# Patient Record
Sex: Male | Born: 1970 | Race: White | Hispanic: No | Smoking: Current every day smoker
Health system: Southern US, Community
[De-identification: ages and names within clinical notes are randomized; demographics above are authoritative.]

## PROBLEM LIST (undated history)

## (undated) DIAGNOSIS — J45909 Unspecified asthma, uncomplicated: Secondary | ICD-10-CM

## (undated) DIAGNOSIS — K219 Gastro-esophageal reflux disease without esophagitis: Secondary | ICD-10-CM

## (undated) DIAGNOSIS — J449 Chronic obstructive pulmonary disease, unspecified: Secondary | ICD-10-CM

## (undated) HISTORY — PX: HERNIA REPAIR: SHX51

## (undated) HISTORY — DX: Gastro-esophageal reflux disease without esophagitis: K21.9

## (undated) HISTORY — DX: Chronic obstructive pulmonary disease, unspecified: J44.9

## (undated) HISTORY — DX: Unspecified asthma, uncomplicated: J45.909

---

## 2005-10-13 ENCOUNTER — Emergency Department: Payer: Self-pay | Admitting: Emergency Medicine

## 2011-10-01 ENCOUNTER — Emergency Department: Payer: Self-pay | Admitting: Emergency Medicine

## 2017-10-05 ENCOUNTER — Ambulatory Visit: Payer: Medicaid Other | Admitting: Adult Health

## 2017-10-05 ENCOUNTER — Encounter: Payer: Self-pay | Admitting: Adult Health

## 2017-10-05 VITALS — BP 126/82 | HR 83 | Resp 16 | Ht 68.0 in | Wt 196.6 lb

## 2017-10-05 DIAGNOSIS — F332 Major depressive disorder, recurrent severe without psychotic features: Secondary | ICD-10-CM | POA: Diagnosis not present

## 2017-10-05 DIAGNOSIS — F1010 Alcohol abuse, uncomplicated: Secondary | ICD-10-CM | POA: Insufficient documentation

## 2017-10-05 DIAGNOSIS — Z23 Encounter for immunization: Secondary | ICD-10-CM

## 2017-10-05 DIAGNOSIS — F17200 Nicotine dependence, unspecified, uncomplicated: Secondary | ICD-10-CM

## 2017-10-05 DIAGNOSIS — F1910 Other psychoactive substance abuse, uncomplicated: Secondary | ICD-10-CM | POA: Insufficient documentation

## 2017-10-05 DIAGNOSIS — K219 Gastro-esophageal reflux disease without esophagitis: Secondary | ICD-10-CM

## 2017-10-05 DIAGNOSIS — R0602 Shortness of breath: Secondary | ICD-10-CM | POA: Diagnosis not present

## 2017-10-05 MED ORDER — OMEPRAZOLE 20 MG PO CPDR: 20.0000 mg | DELAYED_RELEASE_CAPSULE | Freq: Every day | ORAL | 3 refills | Status: DC

## 2017-10-05 MED ORDER — BUPROPION HCL ER (SR) 150 MG PO TB12: 150.0000 mg | ORAL_TABLET | Freq: Two times a day (BID) | ORAL | 1 refills | Status: DC

## 2017-10-05 NOTE — Patient Instructions (Signed)

## 2017-10-05 NOTE — Progress Notes (Signed)
[facility] [address] [place], [place] [address]  Internal MEDICINE  Office Visit Note  Patient Name: Clifford Burgess  [id number]  [id number]  Date of Service: 10/05/2017   Complaints/HPI Pt is here for establishment of PCP. Chief Complaint  Patient presents with  . Medical Management of Chronic Issues    NEW PT , ESTABLISHING CARE , PT HAS HISTORY OF DRUG ABUSE. CURRENT EVERYDAY DRINKER  . Asthma  . COPD    WAS ON OXYGEN CONTINOUSLY  , PATIENT MOVED FROM [place] AND LOST EVERYTHING   . Gastroesophageal Reflux   HPI Pt here to establish care.  He reports a history of GERD, COPD,  with oxygen dependence, and vertigo.  He is a poor historian, and is unsure what medications he was taking.  He moved back from [place] a year ago, and has not had his medications/oxygen since.  He reports his wife and kids are still in [place], but they kicked him out so he is living here with his sister now. Pt reports he smokes 1 pack of cigarettes daily, and drinks at least 6-8 Beers daily, some days he drinks 24. He reports his drinking has increased due to depression about his wife and kids kicking him out.     Current Medication: Outpatient Encounter Medications as of 10/05/2017  Medication Sig  . buPROPion (WELLBUTRIN SR) 150 MG 12 hr tablet Take 1 tablet (150 mg total) by mouth 2 (two) times daily.  Marland Kitchen omeprazole (PRILOSEC) 20 MG capsule Take 1 capsule (20 mg total) by mouth daily.   No facility-administered encounter medications on file as of 10/05/2017.     Surgical History: Past Surgical History:  Procedure Laterality Date  . HERNIA REPAIR      Medical History: Past Medical History:  Diagnosis Date  . Asthma   . COPD (chronic obstructive pulmonary disease) (HCC)   . GERD (gastroesophageal reflux disease)     Family History: Family History  Family history unknown: Yes    Social History   Socioeconomic History  . Marital status: Married    Spouse name: Not on file  . Number  of children: Not on file  . Years of education: Not on file  . Highest education level: Not on file  Occupational History  . Not on file  Social Needs  . Financial resource strain: Not on file  . Food insecurity:    Worry: Not on file    Inability: Not on file  . Transportation needs:    Medical: Not on file    Non-medical: Not on file  Tobacco Use  . Smoking status: Current Every Day Smoker  . Smokeless tobacco: Current User    Types: Chew  Substance and Sexual Activity  . Alcohol use: Yes  . Drug use: Not Currently    Types: Cocaine, Marijuana, Heroin, LSD, "Crack" cocaine  . Sexual activity: Yes  Lifestyle  . Physical activity:    Days per week: Not on file    Minutes per session: Not on file  . Stress: Not on file  Relationships  . Social connections:    Talks on phone: Not on file    Gets together: Not on file    Attends religious service: Not on file    Active member of club or organization: Not on file    Attends meetings of clubs or organizations: Not on file    Relationship status: Not on file  . Intimate partner violence:    Fear of current  or ex partner: Not on file    Emotionally abused: Not on file    Physically abused: Not on file    Forced sexual activity: Not on file  Other Topics Concern  . Not on file  Social History Narrative  . Not on file   Review of Systems  Constitutional: Negative.  Negative for chills, fatigue and unexpected weight change.  HENT: Negative.  Negative for congestion, rhinorrhea, sneezing and sore throat.   Eyes: Negative for redness.  Respiratory: Positive for shortness of breath. Negative for cough and chest tightness.        SOB with minimal exertion  Cardiovascular: Negative.  Negative for chest pain and palpitations.  Gastrointestinal: Negative.  Negative for abdominal pain, constipation, diarrhea, nausea and vomiting.  Endocrine: Negative.   Genitourinary: Negative.  Negative for dysuria and frequency.   Musculoskeletal: Negative.  Negative for arthralgias, back pain, joint swelling and neck pain.  Skin: Negative.  Negative for rash.  Allergic/Immunologic: Negative.   Neurological: Negative.  Negative for tremors and numbness.  Hematological: Negative for adenopathy. Does not bruise/bleed easily.  Psychiatric/Behavioral: Negative.  Negative for behavioral problems, sleep disturbance and suicidal ideas. The patient is not nervous/anxious.     Vital Signs: BP 126/82   Pulse 83   Resp 16   Ht 5\' 8"  (1.727 m)   Wt 196 lb 9.6 oz (89.2 kg)   SpO2 91%   BMI 29.89 kg/m    Physical Exam  Constitutional: He is oriented to person, place, and time. He appears well-developed and well-nourished. No distress.  HENT:  Head: Normocephalic and atraumatic.  Mouth/Throat: Oropharynx is clear and moist. No oropharyngeal exudate.  Eyes: Pupils are equal, round, and reactive to light. EOM are normal.  Neck: Normal range of motion. Neck supple. No JVD present. No tracheal deviation present. No thyromegaly present.  Cardiovascular: Normal rate, regular rhythm and normal heart sounds. Exam reveals no gallop and no friction rub.  No murmur heard. Pulmonary/Chest: Effort normal and breath sounds normal. No respiratory distress. He has no wheezes. He has no rales. He exhibits no tenderness.  Abdominal: Soft. There is no tenderness. There is no guarding.  Musculoskeletal: Normal range of motion.  Lymphadenopathy:    He has no cervical adenopathy.  Neurological: He is alert and oriented to person, place, and time. No cranial nerve deficit.  Skin: Skin is warm and dry. He is not diaphoretic.  Psychiatric: He has a normal mood and affect. His behavior is normal. Judgment and thought content normal.  Nursing note and vitals reviewed.  Assessment/Plan: 1. Gastroesophageal reflux disease without esophagitis Start Prilosec, as before.  Will follow up with results.  Encouraged dietary restrictions.   2. Severe  episode of recurrent major depressive disorder, without psychotic features (HCC) - buPROPion (WELLBUTRIN SR) 150 MG 12 hr tablet; Take 1 tablet (150 mg total) by mouth 2 (two) times daily.  Dispense: 60 tablet; Refill: 1  3. Shortness of breath - Ambulatory referral to Pulmonology - Pulse oximetry, overnight; Future 6 minute walk was inconclusive.  4. Alcohol abuse Encouraged patient to cut down on drinking, and when he is ready to quit, we will assist him.  5. 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  6. Flu vaccine need - Flu Vaccine MDCK QUAD PF  7. Tobacco Dependence Smoking cessation counseling: 1. Pt acknowledges the risks of long term smoking, she will try to quite smoking. 2. Options for different medications  including nicotine products, chewing gum, patch etc, Wellbutrin and Chantix is discussed 3. Goal and date of compete cessation is discussed 4. Total time spent in smoking cessation is 15 min.  General Counseling: notnamed scholz understanding of the findings of todays visit and agrees with plan of treatment. I have discussed any further diagnostic evaluation that may be needed or ordered today. We also reviewed his medications today. he has been encouraged to call the office with any questions or concerns that should arise related to todays visit.  Orders Placed This Encounter  Procedures  . Flu Vaccine MDCK QUAD PF  . Ambulatory referral to Pulmonology  . Pulse oximetry, overnight  . 6 minute walk    Meds ordered this encounter  Medications  . omeprazole (PRILOSEC) 20 MG capsule    Sig: Take 1 capsule (20 mg total) by mouth daily.    Dispense:  30 capsule    Refill:  3  . buPROPion (WELLBUTRIN SR) 150 MG 12 hr tablet    Sig: Take 1 tablet (150 mg total) by mouth 2 (two) times daily.    Dispense:  60 tablet    Refill:  1    Time spent: 25 Minutes   This patient was seen by Blima Ledger AGNP-C in Collaboration  with Dr Lyndon Code as a part of collaborative care agreement

## 2017-10-08 ENCOUNTER — Telehealth: Payer: Self-pay

## 2017-10-08 NOTE — Telephone Encounter (Signed)
Gave [facility] overnight oximetry test order/Beth °

## 2017-10-12 ENCOUNTER — Encounter: Payer: Self-pay | Admitting: Internal Medicine

## 2017-10-12 NOTE — Progress Notes (Signed)
SCANNED IN DME ORDER FOR OVERNIGHT OX ORDERED ON 10/05/17.

## 2017-10-15 ENCOUNTER — Ambulatory Visit: Payer: Medicaid Other | Admitting: Internal Medicine

## 2017-10-15 ENCOUNTER — Encounter: Payer: Self-pay | Admitting: Internal Medicine

## 2017-10-15 ENCOUNTER — Other Ambulatory Visit: Payer: Self-pay | Admitting: Adult Health

## 2017-10-15 VITALS — BP 110/60 | HR 80 | Resp 16 | Ht 69.0 in | Wt 193.0 lb

## 2017-10-15 DIAGNOSIS — R0602 Shortness of breath: Secondary | ICD-10-CM

## 2017-10-15 DIAGNOSIS — F1010 Alcohol abuse, uncomplicated: Secondary | ICD-10-CM | POA: Diagnosis not present

## 2017-10-15 DIAGNOSIS — F17200 Nicotine dependence, unspecified, uncomplicated: Secondary | ICD-10-CM | POA: Insufficient documentation

## 2017-10-15 DIAGNOSIS — Z9981 Dependence on supplemental oxygen: Secondary | ICD-10-CM | POA: Insufficient documentation

## 2017-10-15 NOTE — Patient Instructions (Signed)
Hypoxia Hypoxia is a condition that happens when there is a lack of oxygen in the body's tissues and organs. When there is not enough oxygen, organs cannot work as they should. This causes serious problems throughout the body and in the brain. What are the causes? This condition may be caused by:  Exposure to high altitude.  A collapsed lung (pneumothorax).  Lung infection (pneumonia).  Lung injury.  Long-term (chronic) lung disease, such as COPD (chronic obstructive pulmonary disease).  Blood collecting in the chest cavity (hemothorax).  Food, saliva, or vomit getting into the airway (aspiration).  Reduced blood flow (ischemia).  Severe blood loss.  Slow or shallow breathing (hypoventilation).  Blood disorders, such as anemia.  Carbon monoxide poisoning.  The heart suddenly stopping (cardiac arrest).  Anesthetic medicines.  Drowning.  Choking.  What are the signs or symptoms? Symptoms of this condition include:  Headache.  Fatigue.  Drowsiness.  Forgetfulness.  Nausea.  Confusion.  Shortness of breath.  Dizziness.  Bluish color of the skin, lips, or nail beds (cyanosis).  Change in consciousness or awareness.  If hypoxia is not treated, it can lead to convulsions, loss of consciousness (coma), or brain damage. How is this diagnosed? This condition may be diagnosed based on:  A physical exam.  Blood tests.  A test that measures how much oxygen is in your blood (pulse oximetry). This is done with a sensor that is placed on your finger, toe, or earlobe.  Chest X-ray.  Tests to check your lung function (pulmonary function tests).  A test to check the electrical activity of your heart (electrocardiogram, ECG).  You may have other tests to determine the cause of your hypoxia. How is this treated? Treatment for this condition depends on what is causing the hypoxia. You will likely be treated with oxygen therapy. This may be done by giving you  oxygen through a face mask or through tubes in your nose. Your health care provider may also recommend other therapies to treat the underlying cause of your hypoxia. Follow these instructions at home:  Take over-the-counter and prescription medicines only as told by your health care provider.  Do not use any products that contain nicotine or tobacco, such as cigarettes and e-cigarettes. If you need help quitting, ask your health care provider.  Avoid secondhand smoke.  Work with your health care provider to manage any chronic conditions you have that may be causing hypoxia, such as COPD.  Keep all follow-up visits as told by your health care provider. This is important. Contact a health care provider if:  You have a fever.  You have trouble breathing, even after treatment.  You become extremely short of breath when you exercise. Get help right away if:  Your shortness of breath gets worse, especially with normal or very little activity.  Your skin, lips, or nail beds have a bluish color.  You become confused or you cannot think properly.  You have chest pain. Summary  Hypoxia is a condition that happens when there is a lack of oxygen in the body's tissues and organs.  If hypoxia is not treated, it can lead to convulsions, loss of consciousness (coma), or brain damage.  Symptoms of hypoxia can include a headache, shortness of breath, confusion, nausea, and a bluish skin color.  Hypoxia has many possible causes, including exposure to high altitude, carbon monoxide poisoning, or other health issues, such as blood disorders or cardiac arrest.  Hypoxia is usually treated with oxygen therapy. This information   is not intended to replace advice given to you by your health care provider. Make sure you discuss any questions you have with your health care provider. Document Released: 02/14/2016 Document Revised: 02/14/2016 Document Reviewed: 02/14/2016 Elsevier Interactive Patient  Education  2018 Elsevier Inc.  

## 2017-10-15 NOTE — Progress Notes (Signed)
[facility] [address] [place], [place] [address]  Pulmonary Sleep Medicine   Office Visit Note  Patient Name: Clifford Burgess DOB: 06-Jun-1970 MRN [id number]  Date of Service: 10/15/2017  Complaints/HPI: Patient is here for follow-up he is here to establish with pulmonary patient.  He states that he was on oxygen before he moved here in the state of [place].  Patient states that since he is been here basically has not been on any oxygen.  States he is a smoker and he continues to smoke.  He has really no desire to stop smoking.  He has a history of alcohol abuse still heavy the patient also has a history of drug abuse but he says he is clean and he has not been doing anything in the form of crack or cocaine.  States he has no chest pain no palpitations.  Denies having any fevers or chills.  He does cough brings up phlegm no hemoptysis is noted.  Patient denies having any weight loss.  He does not recall when he had PFTs but has been told that he does have a diagnosis of COPD by his previous providers.  ROS  General: (-) fever, (-) chills, (-) night sweats, (-) weakness Skin: (-) rashes, (-) itching,. Eyes: (-) visual changes, (-) redness, (-) itching. Nose and Sinuses: (-) nasal stuffiness or itchiness, (-) postnasal drip, (-) nosebleeds, (-) sinus trouble. Mouth and Throat: (-) sore throat, (-) hoarseness. Neck: (-) swollen glands, (-) enlarged thyroid, (-) neck pain. Respiratory: + cough, (-) bloody sputum, + shortness of breath, + wheezing. Cardiovascular: - ankle swelling, (-) chest pain. Lymphatic: (-) lymph node enlargement. Neurologic: (-) numbness, (-) tingling. Psychiatric: (-) anxiety, (-) depression   Current Medication: Outpatient Encounter Medications as of 10/15/2017  Medication Sig  . buPROPion (WELLBUTRIN SR) 150 MG 12 hr tablet Take 1 tablet (150 mg total) by mouth 2 (two) times daily.  Marland Kitchen omeprazole (PRILOSEC) 20 MG capsule Take 1 capsule (20 mg total) by  mouth daily.   No facility-administered encounter medications on file as of 10/15/2017.     Surgical History: Past Surgical History:  Procedure Laterality Date  . HERNIA REPAIR      Medical History: Past Medical History:  Diagnosis Date  . Asthma   . COPD (chronic obstructive pulmonary disease) (HCC)   . GERD (gastroesophageal reflux disease)     Family History: Family History  Family history unknown: Yes    Social History: Social History   Socioeconomic History  . Marital status: Married    Spouse name: Not on file  . Number of children: Not on file  . Years of education: Not on file  . Highest education level: Not on file  Occupational History  . Not on file  Social Needs  . Financial resource strain: Not on file  . Food insecurity:    Worry: Not on file    Inability: Not on file  . Transportation needs:    Medical: Not on file    Non-medical: Not on file  Tobacco Use  . Smoking status: Current Every Day Smoker  . Smokeless tobacco: Current User    Types: Chew  Substance and Sexual Activity  . Alcohol use: Yes  . Drug use: Not Currently    Types: Cocaine, Marijuana, Heroin, LSD, "Crack" cocaine  . Sexual activity: Yes  Lifestyle  . Physical activity:    Days per week: Not on file    Minutes per session: Not on file  .  Stress: Not on file  Relationships  . Social connections:    Talks on phone: Not on file    Gets together: Not on file    Attends religious service: Not on file    Active member of club or organization: Not on file    Attends meetings of clubs or organizations: Not on file    Relationship status: Not on file  . Intimate partner violence:    Fear of current or ex partner: Not on file    Emotionally abused: Not on file    Physically abused: Not on file    Forced sexual activity: Not on file  Other Topics Concern  . Not on file  Social History Narrative  . Not on file    Vital Signs: Blood pressure 110/60, pulse 80, resp. rate  16, height 5\' 9"  (1.753 m), weight 193 lb (87.5 kg), SpO2 93 %.  Examination: General Appearance: The patient is well-developed, well-nourished, and in no distress. Skin: Gross inspection of skin unremarkable. Head: normocephalic, no gross deformities. Eyes: no gross deformities noted. ENT: ears appear grossly normal no exudates. Neck: Supple. No thyromegaly. No LAD. Respiratory: few rhonchi are noted. Cardiovascular: Normal S1 and S2 without murmur or rub. Extremities: No cyanosis. pulses are equal. Neurologic: Alert and oriented. No involuntary movements.  LABS: No results found for this or any previous visit (from the past 2160 hour(s)).  Radiology No results found.    Assessment and Plan: Patient Active Problem List   Diagnosis Date Noted  . 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 Smoker 10/15/2017  . SOB (shortness of breath) 10/15/2017  . Alcohol abuse 10/05/2017  . Drug abuse (HCC) 10/05/2017    1. SOB still has significant symptoms of shortness of breath likely related to ongoing tobacco use.  He also has a history of oxygen dependence we need to see if there is still a need for oxygen.  We will get him requalified.  Smoking cessation counseling was of course provided once again.  He also needs to be more active and exercise as tolerated.  We will also get pulmonary functions 2. Smoker continues to smoke heavy he has no desire to stop smoking.  Smoking cessation counseling was provided as part of initial evaluation. 3. ETOH continues to drink once again counseling provided for drinking patient does not really have a desire to stop 4. Drug Abuse by history not currently abusing 5. Oxygen dependence will requalify him for oxygen he will get a 6-minute walk and possibly a nocturnal oximetry  General Counseling: I have discussed the findings of the evaluation and examination with Clifford Burgess.  I have also discussed any further diagnostic evaluation thatmay be needed  or ordered today. Clifford Burgess verbalizes understanding of the findings of todays visit. We also reviewed his medications today and discussed drug interactions and side effects including but not limited excessive drowsiness and altered mental states. We also discussed that there is always a risk not just to him but also people around him. he has been encouraged to call the office with any questions or concerns that should arise related to todays visit.    Time spent:  I have personally obtained a history, examined the patient, evaluated laboratory and imaging results, formulated the assessment and plan and placed orders.    Yevonne Pax, MD [facility] Pulmonary and Critical Care Sleep medicine

## 2017-10-16 LAB — CBC WITH DIFFERENTIAL/PLATELET
Basophils Absolute: 0 10*3/uL (ref 0.0–0.2)
Basos: 0 %
EOS (ABSOLUTE): 0.1 10*3/uL (ref 0.0–0.4)
Eos: 2 %
Hematocrit: 41 % (ref 37.5–51.0)
Hemoglobin: 14 g/dL (ref 13.0–17.7)
IMMATURE GRANS (ABS): 0 10*3/uL (ref 0.0–0.1)
IMMATURE GRANULOCYTES: 0 %
LYMPHS: 32 %
Lymphocytes Absolute: 2.2 10*3/uL (ref 0.7–3.1)
MCH: 30.8 pg (ref 26.6–33.0)
MCHC: 34.1 g/dL (ref 31.5–35.7)
MCV: 90 fL (ref 79–97)
MONOS ABS: 0.6 10*3/uL (ref 0.1–0.9)
Monocytes: 9 %
NEUTROS PCT: 57 %
Neutrophils Absolute: 3.9 10*3/uL (ref 1.4–7.0)
PLATELETS: 358 10*3/uL (ref 150–450)
RBC: 4.55 x10E6/uL (ref 4.14–5.80)
RDW: 12.5 % (ref 12.3–15.4)
WBC: 6.9 10*3/uL (ref 3.4–10.8)

## 2017-10-16 LAB — COMPREHENSIVE METABOLIC PANEL
ALBUMIN: 4 g/dL (ref 3.5–5.5)
ALK PHOS: 53 IU/L (ref 39–117)
ALT: 12 IU/L (ref 0–44)
AST: 13 IU/L (ref 0–40)
Albumin/Globulin Ratio: 1.7 (ref 1.2–2.2)
BUN / CREAT RATIO: 13 (ref 9–20)
BUN: 13 mg/dL (ref 6–24)
CALCIUM: 9.4 mg/dL (ref 8.7–10.2)
CHLORIDE: 101 mmol/L (ref 96–106)
CO2: 26 mmol/L (ref 20–29)
CREATININE: 0.98 mg/dL (ref 0.76–1.27)
GFR calc Af Amer: 106 mL/min/{1.73_m2} (ref >59)
GFR calc non Af Amer: 91 mL/min/{1.73_m2} (ref >59)
GLOBULIN, TOTAL: 2.3 g/dL (ref 1.5–4.5)
GLUCOSE: 103 mg/dL — AB (ref 65–99)
Potassium: 5.1 mmol/L (ref 3.5–5.2)
SODIUM: 142 mmol/L (ref 134–144)
Total Protein: 6.3 g/dL (ref 6.0–8.5)

## 2017-10-16 LAB — LIPID PANEL WITH LDL/HDL RATIO
CHOLESTEROL TOTAL: 189 mg/dL (ref 100–199)
HDL: 48 mg/dL (ref >39)
LDL Calculated: 114 mg/dL — ABNORMAL HIGH (ref 0–99)
LDl/HDL Ratio: 2.4 ratio (ref 0.0–3.6)
Triglycerides: 135 mg/dL (ref 0–149)
VLDL Cholesterol Cal: 27 mg/dL (ref 5–40)

## 2017-10-16 LAB — TSH: TSH: 1.13 u[IU]/mL (ref 0.450–4.500)

## 2017-10-16 LAB — PSA: PROSTATE SPECIFIC AG, SERUM: 1.1 ng/mL (ref 0.0–4.0)

## 2017-10-16 LAB — T4, FREE: Free T4: 1.24 ng/dL (ref 0.82–1.77)

## 2017-10-19 ENCOUNTER — Encounter: Payer: Self-pay | Admitting: Internal Medicine

## 2017-10-19 NOTE — Progress Notes (Signed)
SCANNED IN OVERNIGHT PULSE OX STARTED ON 10/14/17.

## 2017-10-22 ENCOUNTER — Telehealth: Payer: Self-pay

## 2017-10-22 NOTE — Telephone Encounter (Signed)
Gave [facility] orders for nightly o2 set up.Clifford Burgess

## 2017-10-26 ENCOUNTER — Telehealth: Payer: Self-pay | Admitting: Internal Medicine

## 2017-10-26 NOTE — Telephone Encounter (Signed)
SIGNED CMN PUT INTO [facility] FOLDER TO BE PICKED UP.JW

## 2017-11-01 ENCOUNTER — Ambulatory Visit: Payer: Medicaid Other | Admitting: Adult Health

## 2017-11-01 ENCOUNTER — Encounter: Payer: Self-pay | Admitting: Adult Health

## 2017-11-01 VITALS — BP 126/62 | HR 64 | Resp 16 | Ht 69.0 in | Wt 193.0 lb

## 2017-11-01 DIAGNOSIS — F17200 Nicotine dependence, unspecified, uncomplicated: Secondary | ICD-10-CM | POA: Diagnosis not present

## 2017-11-01 DIAGNOSIS — F640 Transsexualism: Secondary | ICD-10-CM

## 2017-11-01 DIAGNOSIS — R300 Dysuria: Secondary | ICD-10-CM

## 2017-11-01 DIAGNOSIS — J449 Chronic obstructive pulmonary disease, unspecified: Secondary | ICD-10-CM

## 2017-11-01 DIAGNOSIS — G4734 Idiopathic sleep related nonobstructive alveolar hypoventilation: Secondary | ICD-10-CM

## 2017-11-01 DIAGNOSIS — K219 Gastro-esophageal reflux disease without esophagitis: Secondary | ICD-10-CM

## 2017-11-01 DIAGNOSIS — Z0001 Encounter for general adult medical examination with abnormal findings: Secondary | ICD-10-CM

## 2017-11-01 DIAGNOSIS — Z1159 Encounter for screening for other viral diseases: Secondary | ICD-10-CM

## 2017-11-01 DIAGNOSIS — Z789 Other specified health status: Secondary | ICD-10-CM

## 2017-11-01 DIAGNOSIS — F1010 Alcohol abuse, uncomplicated: Secondary | ICD-10-CM

## 2017-11-01 DIAGNOSIS — F339 Major depressive disorder, recurrent, unspecified: Secondary | ICD-10-CM | POA: Diagnosis not present

## 2017-11-01 DIAGNOSIS — Z114 Encounter for screening for human immunodeficiency virus [HIV]: Secondary | ICD-10-CM

## 2017-11-01 NOTE — Patient Instructions (Signed)
Coping with Quitting Smoking Quitting smoking is a physical and mental challenge. You will face cravings, withdrawal symptoms, and temptation. Before quitting, work with your health care provider to make a plan that can help you cope. Preparation can help you quit and keep you from giving in. How can I cope with cravings? Cravings usually last for 5-10 minutes. If you get through it, the craving will pass. Consider taking the following actions to help you cope with cravings:  Keep your mouth busy: ? Chew sugar-free gum. ? Suck on hard candies or a straw. ? Brush your teeth.  Keep your hands and body busy: ? Immediately change to a different activity when you feel a craving. ? Squeeze or play with a ball. ? Do an activity or a hobby, like making bead jewelry, practicing needlepoint, or working with wood. ? Mix up your normal routine. ? Take a short exercise break. Go for a quick walk or run up and down stairs. ? Spend time in public places where smoking is not allowed.  Focus on doing something kind or helpful for someone else.  Call a friend or family member to talk during a craving.  Join a support group.  Call a quit line, such as 1-800-QUIT-NOW.  Talk with your health care provider about medicines that might help you cope with cravings and make quitting easier for you.  How can I deal with withdrawal symptoms? Your body may experience negative effects as it tries to get used to not having nicotine in the system. These effects are called withdrawal symptoms. They may include:  Feeling hungrier than normal.  Trouble concentrating.  Irritability.  Trouble sleeping.  Feeling depressed.  Restlessness and agitation.  Craving a cigarette.  To manage withdrawal symptoms:  Avoid places, people, and activities that trigger your cravings.  Remember why you want to quit.  Get plenty of sleep.  Avoid coffee and other caffeinated drinks. These may worsen some of your  symptoms.  How can I handle social situations? Social situations can be difficult when you are quitting smoking, especially in the first few weeks. To manage this, you can:  Avoid parties, bars, and other social situations where people might be smoking.  Avoid alcohol.  Leave right away if you have the urge to smoke.  Explain to your family and friends that you are quitting smoking. Ask for understanding and support.  Plan activities with friends or family where smoking is not an option.  What are some ways I can cope with stress? Wanting to smoke may cause stress, and stress can make you want to smoke. Find ways to manage your stress. Relaxation techniques can help. For example:  Breathe slowly and deeply, in through your nose and out through your mouth.  Listen to soothing, relaxing music.  Talk with a family member or friend about your stress.  Light a candle.  Soak in a bath or take a shower.  Think about a peaceful place.  What are some ways I can prevent weight gain? Be aware that many people gain weight after they quit smoking. However, not everyone does. To keep from gaining weight, have a plan in place before you quit and stick to the plan after you quit. Your plan should include:  Having healthy snacks. When you have a craving, it may help to: ? Eat plain popcorn, crunchy carrots, celery, or other cut vegetables. ? Chew sugar-free gum.  Changing how you eat: ? Eat small portion sizes at meals. ?   Eat 4-6 small meals throughout the day instead of 1-2 large meals a day. ? Be mindful when you eat. Do not watch television or do other things that might distract you as you eat.  Exercising regularly: ? Make time to exercise each day. If you do not have time for a long workout, do short bouts of exercise for 5-10 minutes several times a day. ? Do some form of strengthening exercise, like weight lifting, and some form of aerobic exercise, like running or  swimming.  Drinking plenty of water or other low-calorie or no-calorie drinks. Drink 6-8 glasses of water daily, or as much as instructed by your health care provider.  Summary  Quitting smoking is a physical and mental challenge. You will face cravings, withdrawal symptoms, and temptation to smoke again. Preparation can help you as you go through these challenges.  You can cope with cravings by keeping your mouth busy (such as by chewing gum), keeping your body and hands busy, and making calls to family, friends, or a helpline for people who want to quit smoking.  You can cope with withdrawal symptoms by avoiding places where people smoke, avoiding drinks with caffeine, and getting plenty of rest.  Ask your health care provider about the different ways to prevent weight gain, avoid stress, and handle social situations. This information is not intended to replace advice given to you by your health care provider. Make sure you discuss any questions you have with your health care provider. Document Released: 12/24/2015 Document Revised: 12/24/2015 Document Reviewed: 12/24/2015 Elsevier Interactive Patient Education  2018 [employer]  

## 2017-11-01 NOTE — Progress Notes (Signed)
[facility] [address] [place], [place] [address]  Internal MEDICINE  Office Visit Note  Patient Name: Clifford Burgess  [id number]  [id number]  Date of Service: 11/02/2017  Chief Complaint  Patient presents with  . Annual Exam    review labs   . Gastroesophageal Reflux  . COPD     HPI Pt is here for routine health maintenance examination.  Patient is a well-appearing 47 year old male. He is currently being treated for depression, nocturnal Hypoxia, GERD, copd. Generally he is doing well. He is scheduled for PFT's to evaluate his copd.  Unfortunately he continues to smoke.  Labs are reviewed with patient at this time.  He denies any further issues. He did however confide in me that he is interested in taking hormones to transition to a woman, and would like some guidance at this time.        Current Medication: Outpatient Encounter Medications as of 11/01/2017  Medication Sig  . buPROPion (WELLBUTRIN SR) 150 MG 12 hr tablet Take 1 tablet (150 mg total) by mouth 2 (two) times daily.  Marland Kitchen omeprazole (PRILOSEC) 20 MG capsule Take 1 capsule (20 mg total) by mouth daily.   No facility-administered encounter medications on file as of 11/01/2017.     Surgical History: Past Surgical History:  Procedure Laterality Date  . HERNIA REPAIR      Medical History: Past Medical History:  Diagnosis Date  . Asthma   . COPD (chronic obstructive pulmonary disease) (HCC)   . GERD (gastroesophageal reflux disease)     Family History: Family History  Family history unknown: Yes      Review of Systems  Constitutional: Negative.  Negative for chills, fatigue and unexpected weight change.  HENT: Negative.  Negative for congestion, rhinorrhea, sneezing and sore throat.   Eyes: Negative for redness.  Respiratory: Negative.  Negative for cough, chest tightness and shortness of breath.   Cardiovascular: Negative.  Negative for chest pain and palpitations.  Gastrointestinal:  Negative.  Negative for abdominal pain, constipation, diarrhea, nausea and vomiting.  Endocrine: Negative.   Genitourinary: Negative.  Negative for dysuria and frequency.  Musculoskeletal: Negative.  Negative for arthralgias, back pain, joint swelling and neck pain.  Skin: Negative.  Negative for rash.  Allergic/Immunologic: Negative.   Neurological: Negative.  Negative for tremors and numbness.  Hematological: Negative for adenopathy. Does not bruise/bleed easily.  Psychiatric/Behavioral: Negative.  Negative for behavioral problems, sleep disturbance and suicidal ideas. The patient is not nervous/anxious.      Vital Signs: BP 126/62   Pulse 64   Resp 16   Ht 5\' 9"  (1.753 m)   Wt 193 lb (87.5 kg)   SpO2 93%   BMI 28.50 kg/m    Physical Exam  Constitutional: He is oriented to person, place, and time. He appears well-developed and well-nourished. No distress.  HENT:  Head: Normocephalic and atraumatic.  Mouth/Throat: Oropharynx is clear and moist. No oropharyngeal exudate.  Eyes: Pupils are equal, round, and reactive to light. EOM are normal.  Neck: Normal range of motion. Neck supple. No JVD present. No tracheal deviation present. No thyromegaly present.  Cardiovascular: Normal rate, regular rhythm and normal heart sounds. Exam reveals no gallop and no friction rub.  No murmur heard. Pulmonary/Chest: Effort normal and breath sounds normal. No respiratory distress. He has no wheezes. He has no rales. He exhibits no tenderness.  Abdominal: Soft. There is no tenderness. There is no guarding.  Musculoskeletal: Normal range of motion.  Lymphadenopathy:  He has no cervical adenopathy.  Neurological: He is alert and oriented to person, place, and time. No cranial nerve deficit.  Skin: Skin is warm and dry. He is not diaphoretic.  Psychiatric: He has a normal mood and affect. His behavior is normal. Judgment and thought content normal.  Nursing note and vitals  reviewed.    LABS: Recent Results (from the past 2160 hour(s))  CBC with Differential/Platelet     Status: None   Collection Time: 10/15/17 10:09 AM  Result Value Ref Range   WBC 6.9 3.4 - 10.8 x10E3/uL   RBC 4.55 4.14 - 5.80 x10E6/uL   Hemoglobin 14.0 13.0 - 17.7 g/dL   Hematocrit 16.1 09.6 - 51.0 %   MCV 90 79 - 97 fL   MCH 30.8 26.6 - 33.0 pg   MCHC 34.1 31.5 - 35.7 g/dL   RDW 04.5 40.9 - 81.1 %   Platelets 358 150 - 450 x10E3/uL   Neutrophils 57 Not Estab. %   Lymphs 32 Not Estab. %   Monocytes 9 Not Estab. %   Eos 2 Not Estab. %   Basos 0 Not Estab. %   Neutrophils Absolute 3.9 1.4 - 7.0 x10E3/uL   Lymphocytes Absolute 2.2 0.7 - 3.1 x10E3/uL   Monocytes Absolute 0.6 0.1 - 0.9 x10E3/uL   EOS (ABSOLUTE) 0.1 0.0 - 0.4 x10E3/uL   Basophils Absolute 0.0 0.0 - 0.2 x10E3/uL   Immature Granulocytes 0 Not Estab. %   Immature Grans (Abs) 0.0 0.0 - 0.1 x10E3/uL   Hematology Comments: Note:     Comment: Verified by microscopic examination.  Comprehensive metabolic panel     Status: Abnormal   Collection Time: 10/15/17 10:09 AM  Result Value Ref Range   Glucose 103 (H) 65 - 99 mg/dL   BUN 13 6 - 24 mg/dL   Creatinine, Ser 9.14 0.76 - 1.27 mg/dL   GFR calc non Af Amer 91 >59 mL/min/1.73   GFR calc Af Amer 106 >59 mL/min/1.73   BUN/Creatinine Ratio 13 9 - 20   Sodium 142 134 - 144 mmol/L   Potassium 5.1 3.5 - 5.2 mmol/L   Chloride 101 96 - 106 mmol/L   CO2 26 20 - 29 mmol/L   Calcium 9.4 8.7 - 10.2 mg/dL   Total Protein 6.3 6.0 - 8.5 g/dL   Albumin 4.0 3.5 - 5.5 g/dL   Globulin, Total 2.3 1.5 - 4.5 g/dL   Albumin/Globulin Ratio 1.7 1.2 - 2.2   Bilirubin Total <0.2 0.0 - 1.2 mg/dL   Alkaline Phosphatase 53 39 - 117 IU/L   AST 13 0 - 40 IU/L   ALT 12 0 - 44 IU/L  Lipid Panel With LDL/HDL Ratio     Status: Abnormal   Collection Time: 10/15/17 10:09 AM  Result Value Ref Range   Cholesterol, Total 189 100 - 199 mg/dL   Triglycerides 782 0 - 149 mg/dL   HDL 48 >95 mg/dL    VLDL Cholesterol Cal 27 5 - 40 mg/dL   LDL Calculated 621 (H) 0 - 99 mg/dL   LDl/HDL Ratio 2.4 0.0 - 3.6 ratio    Comment:                                     LDL/HDL Ratio  Men  Women                               1/2 Avg.Risk  1.0    1.5                                   Avg.Risk  3.6    3.2                                2X Avg.Risk  6.2    5.0                                3X Avg.Risk  8.0    6.1   T4, free     Status: None   Collection Time: 10/15/17 10:09 AM  Result Value Ref Range   Free T4 1.24 0.82 - 1.77 ng/dL  TSH     Status: None   Collection Time: 10/15/17 10:09 AM  Result Value Ref Range   TSH 1.130 0.450 - 4.500 uIU/mL  PSA     Status: None   Collection Time: 10/15/17 10:09 AM  Result Value Ref Range   Prostate Specific Ag, Serum 1.1 0.0 - 4.0 ng/mL    Comment: **Verified by repeat analysis** Roche ECLIA methodology. According to the American Urological Association, Serum PSA should decrease and remain at undetectable levels after radical prostatectomy. The AUA defines biochemical recurrence as an initial PSA value 0.2 ng/mL or greater followed by a subsequent confirmatory PSA value 0.2 ng/mL or greater. Values obtained with different assay methods or kits cannot be used interchangeably. Results cannot be interpreted as absolute evidence of the presence or absence of malignant disease.   UA/M w/rflx Culture, Routine     Status: None   Collection Time: 11/01/17  9:25 AM  Result Value Ref Range   Specific Gravity, UA 1.022 1.005 - 1.030   pH, UA 6.0 5.0 - 7.5   Color, UA Yellow Yellow   Appearance Ur Clear Clear   Leukocytes, UA Negative Negative   Protein, UA Negative Negative/Trace   Glucose, UA Negative Negative   Ketones, UA Negative Negative   RBC, UA Negative Negative   Bilirubin, UA Negative Negative   Urobilinogen, Ur 0.2 0.2 - 1.0 mg/dL   Nitrite, UA Negative Negative   Microscopic Examination Comment      Comment: Microscopic follows if indicated.   Microscopic Examination See below:     Comment: Microscopic was indicated and was performed.   Urinalysis Reflex Comment     Comment: This specimen will not reflex to a Urine Culture.  Microscopic Examination     Status: None   Collection Time: 11/01/17  9:25 AM  Result Value Ref Range   WBC, UA 0-5 0 - 5 /hpf   RBC, UA 0-2 0 - 2 /hpf   Epithelial Cells (non renal) None seen 0 - 10 /hpf   Casts None seen None seen /lpf   Mucus, UA Present Not Estab.   Bacteria, UA None seen None seen/Few    Assessment/Plan: 1. Encounter for general adult medical examination with abnormal findings Up to date on PHM. Labs reviewed with patient.  2. Nocturnal hypoxia Patient is overnight oximetry revealed nocturnal hypoxia.  Patient currently using  oxygen 2 L via nasal cannula at night while sleeping.  3. Depression, recurrent (HCC) Patient currently on Wellbutrin twice daily.  4. Chronic obstructive pulmonary disease, unspecified COPD type (HCC) Patient has a self-reported history of COPD from his former doctors in [place].  He is not currently using any inhalers.  He is scheduled for her pulmonary function test.   5. Gastroesophageal reflux disease without esophagitis Patient reports good control of his acid reflux using omeprazole.  Encourage patient to continue using medication at this time.  6. Nicotine dependence with current use Smoking cessation counseling: 1. Pt acknowledges the risks of long term smoking, she will try to quite smoking. 2. Options for different medications including nicotine products, chewing gum, patch etc, Wellbutrin and Chantix is discussed 3. Goal and date of compete cessation is discussed 4. Total time spent in smoking cessation is 15 min.  7. Alcohol abuse Patient reports he has cut down on his alcohol consumption since our last visit.  He reports drinking 2 times in the last 14 days.  We will continue to monitor his  alcohol consumption.  8. Transgender Near end of visit patient expressed to me that he is interested in hormone replacement therapy.  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  He actively dresses as a woman at times.  He feels like he has always been male.  He would like to be called Liza.  We discussed that he should see psychiatry to evaluate and develop a plan for his treatment.  Patient was given resources to call.  Will do psychiatric referral to begin patient's evaluation and treatment.   9. Dysuria - UA/M w/rflx Culture, Routine  10. Screening for HIV (human immunodeficiency virus) - HIV antibody (with reflex)  11. Encounter for hepatitis C screening test for low risk patient - Hepatitis c antibody (reflex)  12. Need for hepatitis B screening test - Hep B Core Ab W/Reflex  General Counseling: Parmvir verbalizes understanding of the findings of todays visit and agrees with plan of treatment. I have discussed any further diagnostic evaluation that may be needed or ordered today. We also reviewed his medications today. he has been encouraged to call the office with any questions or concerns that should arise related to todays visit.   Orders Placed This Encounter  Procedures  . Microscopic Examination  . UA/M w/rflx Culture, Routine  . HIV antibody (with reflex)  . Hep B Core Ab W/Reflex  . Hepatitis c antibody (reflex)  . Ambulatory referral to Psychiatry    No orders of the defined types were placed in this encounter.   Time spent: 30 Minutes   This patient was seen by Blima Ledger AGNP-C in Collaboration with Dr Lyndon Code as a part of collaborative care agreement    Johnna Acosta AGNP-C Internal Medicine

## 2017-11-02 LAB — UA/M W/RFLX CULTURE, ROUTINE
Bilirubin, UA: NEGATIVE
Glucose, UA: NEGATIVE
Ketones, UA: NEGATIVE
Leukocytes, UA: NEGATIVE
Nitrite, UA: NEGATIVE
PH UA: 6 (ref 5.0–7.5)
PROTEIN UA: NEGATIVE
RBC, UA: NEGATIVE
SPEC GRAV UA: 1.022 (ref 1.005–1.030)
Urobilinogen, Ur: 0.2 mg/dL (ref 0.2–1.0)

## 2017-11-02 LAB — MICROSCOPIC EXAMINATION
Bacteria, UA: NONE SEEN
CASTS: NONE SEEN /LPF
Epithelial Cells (non renal): NONE SEEN /hpf (ref 0–10)

## 2017-11-07 ENCOUNTER — Ambulatory Visit (INDEPENDENT_AMBULATORY_CARE_PROVIDER_SITE_OTHER): Payer: Medicaid Other | Admitting: Internal Medicine

## 2017-11-07 DIAGNOSIS — R0602 Shortness of breath: Secondary | ICD-10-CM | POA: Diagnosis not present

## 2017-11-07 LAB — PULMONARY FUNCTION TEST

## 2017-11-08 LAB — HIV ANTIBODY (ROUTINE TESTING W REFLEX): HIV Screen 4th Generation wRfx: NONREACTIVE

## 2017-11-08 LAB — HEPATITIS C ANTIBODY (REFLEX): HCV Ab: <0.1 s/co ratio (ref 0.0–0.9)

## 2017-11-08 LAB — HCV COMMENT:

## 2017-11-08 LAB — HEPATITIS B CORE AB W/REFLEX: Hep B Core Total Ab: NEGATIVE

## 2017-11-08 NOTE — Procedures (Signed)
[facility] [address] [place], [address]  DATE OF SERVICE: November 07, 2017  Complete Pulmonary Function Testing Interpretation:  FINDINGS:  Forced vital capacity is normal FEV1 is normal FEV1 FVC ratio is normal.  Postbronchodilator no significant improvement in the FEV1 however clinical improvement may occur in the absence of spirometric improvement and does not preclude the use of bronchodilators.  Total lung capacity is normal residual volume is normal residual volume total lung capacity ratio is increased FRC is increased.  DLCO is mildly decreased  IMPRESSION:  Spirometry and lung volumes are within normal limits.  DLCO was mildly decreased may reflect ongoing tobacco use interstitial lung disease or emphysema clinical correlation is recommended.  Yevonne Pax, MD [facility] Pulmonary Critical Care Medicine Sleep Medicine

## 2017-11-27 ENCOUNTER — Ambulatory Visit (INDEPENDENT_AMBULATORY_CARE_PROVIDER_SITE_OTHER): Payer: Medicaid Other | Admitting: Psychiatry

## 2017-11-27 ENCOUNTER — Other Ambulatory Visit: Payer: Self-pay

## 2017-11-27 ENCOUNTER — Encounter: Payer: Self-pay | Admitting: Psychiatry

## 2017-11-27 VITALS — BP 125/78 | HR 76 | Temp 98.3°F | Wt 194.4 lb

## 2017-11-27 DIAGNOSIS — K219 Gastro-esophageal reflux disease without esophagitis: Secondary | ICD-10-CM | POA: Insufficient documentation

## 2017-11-27 DIAGNOSIS — F17200 Nicotine dependence, unspecified, uncomplicated: Secondary | ICD-10-CM

## 2017-11-27 DIAGNOSIS — F1020 Alcohol dependence, uncomplicated: Secondary | ICD-10-CM | POA: Diagnosis not present

## 2017-11-27 DIAGNOSIS — J441 Chronic obstructive pulmonary disease with (acute) exacerbation: Secondary | ICD-10-CM | POA: Insufficient documentation

## 2017-11-27 DIAGNOSIS — F640 Transsexualism: Secondary | ICD-10-CM | POA: Diagnosis not present
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
## 2017-11-27 NOTE — Patient Instructions (Signed)
What You Need to Know About Gender Identity Gender identity is a person's inner sense of being male, male, both, or neither. It determines how you see yourself in the world and how you want to be seen by others. Gender identity may change or develop over time. What is biological sex? Biological sex is assigned to you at birth based on whether you have male or male sex genitals (genitalia) and sex organs. Gender identity is not the same as sexual identity. For most people, their gender identity matches (aligns with) their biological sex (cisgender). For others, their biological sex is different from their gender identity (transgender). A person may realize that he or she is transgender in childhood or later in life. Transgender people come from every ethnic and religious background. They are part of communities across the country and around the world. What happens when gender identity does not align with biological sex? Some people take actions to live as a person of their gender identity. This may include:  Wearing clothes or doing activities that are typical of people with their gender identity.  Dressing and openly behaving as someone with their gender identity.  Getting therapy to help make the transition to live as a person with their gender identity.  Taking hormone medicines to change their outer looks to match their gender identity.  Having surgery to change their anatomy to match their gender identity.  Gender misalignment is not a mental health disorder, but the distress of it can cause mental health problems. If distress lasts for at least 6 months and makes it hard to function in daily life, it can lead to a mental health condition (gender dysphoria). Gender dysphoria can be helped with mental health treatment. It may be resolved with physical gender reassignment through hormone treatments or surgery. A person experiencing gender dysphoria may:  Want to be treated as a person of  the other sex.  Insist that he or she is actually a person of the other sex.  Dislike his or her sexual anatomy.  Believe that he or she is in the wrong body.  What are some challenges of gender misalignment? Gender misalignment can cause many challenges. These vary from person to person. Common challenges include:  Not being accepted by others.  Being bullied, teased, disliked, or abused.  Being denied equal rights (discrimination).  Being targeted for hate crimes.  Having trouble getting insurance coverage for treatment.  Anxiety or depression. This may involve suicidal thoughts (ideation) or suicide attempts.  High levels of homelessness and unemployment.  How can I support someone who is transgender?  Maintain open, honest communication with the person.  Learn as much as you can about gender identity issues.  Do not discriminate against people who have a different gender identity.  Create an environment that is supportive and respectful. Allow zero tolerance for disrespect, negative comments, or pressure to conform.  Express love and support for the person's efforts to express their gender identity.  Use the names and pronouns that the person prefers.  Accept gender identity issues as a difference and not a disorder.  Be aware of your own attitudes and prejudices. It may help: ? To work on your attitudes so that you can better support the person. ? To learn more and talk with others to help you manage your feelings. It is not the job of the transgender person to help you with this.  If you are struggling to deal with a friend or loved one's gender  identity, get counseling. Where to find support:  Your local transgender advocacy organization.  A therapist or psychologist who has expertise in gender identity issues.  [facility]: www.transequality.org  Gender Spectrum: genderspectrum.org/lounge  [employer]:  www.thetrevorproject.org Where to find more information:  Human Rights Campaign: https://www.juarez-rogers.info/www.hrc.org  American Psychological Association: DiceTournament.cawww.apa.org  American Academy of Pediatrics: www.healthychildren.org Summary  Gender identity is the inner sense of being male, male, a combination of both, or neither. Biological sex is assigned at birth based on genitals and sex organs.  For most people, their gender identity matches (aligns with) their biological sex (cisgender). For others, their biological sex is different from their gender identity (transgender).  To support someone who is transgender, maintain open, honest communication with the person.  If you are struggling to deal with a friend or loved one's gender identity, get counseling. This information is not intended to replace advice given to you by your health care provider. Make sure you discuss any questions you have with your health care provider. Document Released: 12/13/2011 Document Revised: 12/11/2015 Document Reviewed: 12/11/2015 Elsevier Interactive Patient Education  [employer]2018 Elsevier Inc.

## 2017-11-29 ENCOUNTER — Other Ambulatory Visit: Payer: Self-pay | Admitting: Adult Health

## 2017-11-29 DIAGNOSIS — F332 Major depressive disorder, recurrent severe without psychotic features: Secondary | ICD-10-CM

## 2017-12-18 ENCOUNTER — Ambulatory Visit: Payer: Medicaid Other | Admitting: Licensed Clinical Social Worker

## 2018-01-01 ENCOUNTER — Ambulatory Visit: Payer: Medicaid Other | Admitting: Licensed Clinical Social Worker

## 2018-01-07 ENCOUNTER — Ambulatory Visit: Payer: Medicaid Other | Admitting: Adult Health

## 2018-01-07 ENCOUNTER — Encounter: Payer: Self-pay | Admitting: Adult Health

## 2018-01-07 VITALS — BP 149/91 | HR 90 | Resp 16 | Ht 69.0 in | Wt 204.0 lb

## 2018-01-07 DIAGNOSIS — F640 Transsexualism: Secondary | ICD-10-CM | POA: Diagnosis not present

## 2018-01-07 DIAGNOSIS — K219 Gastro-esophageal reflux disease without esophagitis: Secondary | ICD-10-CM

## 2018-01-07 DIAGNOSIS — Z789 Other specified health status: Secondary | ICD-10-CM

## 2018-01-07 DIAGNOSIS — Z9981 Dependence on supplemental oxygen: Secondary | ICD-10-CM

## 2018-01-07 DIAGNOSIS — F17200 Nicotine dependence, unspecified, uncomplicated: Secondary | ICD-10-CM | POA: Diagnosis not present

## 2018-01-07 DIAGNOSIS — R030 Elevated blood-pressure reading, without diagnosis of hypertension: Secondary | ICD-10-CM

## 2018-01-07 MED ORDER — OMEPRAZOLE 20 MG PO CPDR: 20.0000 mg | DELAYED_RELEASE_CAPSULE | Freq: Every day | ORAL | 3 refills | Status: DC

## 2018-01-07 NOTE — Progress Notes (Addendum)
[facility] [address] [place], [place] [address]  Internal MEDICINE  Office Visit Note  Patient Name: Clifford Burgess  [id number]  [id number]  Date of Service: 01/07/2018  Chief Complaint  Patient presents with  . Gastroesophageal Reflux    HPI  Pt is here for follow up on GERD as well as following up from his visit with psychiatry.  Patient appears to be doing well.  She is smiling and interactive at this visit.  It is of note that the patient has come to the office with manicured and painted fingernails.  As well as some jewelry and light make-up.  She appears to be expressing her gender identity as male a little more openly at this time.   Current Medication: Outpatient Encounter Medications as of 01/07/2018  Medication Sig  . buPROPion (WELLBUTRIN SR) 150 MG 12 hr tablet TAKE 1 TABLET BY MOUTH TWICE A DAY  . omeprazole (PRILOSEC) 20 MG capsule Take 1 capsule (20 mg total) by mouth daily.   No facility-administered encounter medications on file as of 01/07/2018.     Surgical History: Past Surgical History:  Procedure Laterality Date  . HERNIA REPAIR      Medical History: Past Medical History:  Diagnosis Date  . Asthma   . COPD (chronic obstructive pulmonary disease) (HCC)   . GERD (gastroesophageal reflux disease)     Family History: Family History  Family history unknown: Yes    Social History   Socioeconomic History  . Marital status: Divorced    Spouse name: Not on file  . Number of children: 2  . Years of education: Not on file  . Highest education level: 8th grade  Occupational History  . Not on file  Social Needs  . Financial resource strain: Hard  . Food insecurity:    Worry: Often true    Inability: Often true  . Transportation needs:    Medical: No    Non-medical: No  Tobacco Use  . Smoking status: Current Every Day Smoker    Packs/day: 1.00    Types: Cigarettes  . Smokeless tobacco: Current User    Types: Chew   Substance and Sexual Activity  . Alcohol use: Yes    Alcohol/week: 2.0 standard drinks    Types: 2 Cans of beer per week  . Drug use: Not Currently    Types: Cocaine, Marijuana, Heroin, LSD, "Crack" cocaine  . Sexual activity: Not Currently  Lifestyle  . Physical activity:    Days per week: 0 days    Minutes per session: 0 min  . Stress: Not at all  Relationships  . Social connections:    Talks on phone: Not on file    Gets together: Not on file    Attends religious service: Never    Active member of club or organization: No    Attends meetings of clubs or organizations: Never    Relationship status: Divorced  . Intimate partner violence:    Fear of current or ex partner: Not on file    Emotionally abused: No    Physically abused: No    Forced sexual activity: No  Other Topics Concern  . Not on file  Social History Narrative  . Not on file      Review of Systems  Constitutional: Negative.  Negative for chills, fatigue and unexpected weight change.  HENT: Negative.  Negative for congestion, rhinorrhea, sneezing and sore throat.   Eyes: Negative for redness.  Respiratory: Negative.  Negative for  cough, chest tightness and shortness of breath.   Cardiovascular: Negative.  Negative for chest pain and palpitations.  Gastrointestinal: Negative.  Negative for abdominal pain, constipation, diarrhea, nausea and vomiting.  Endocrine: Negative.   Genitourinary: Negative.  Negative for dysuria and frequency.  Musculoskeletal: Negative.  Negative for arthralgias, back pain, joint swelling and neck pain.  Skin: Negative.  Negative for rash.  Allergic/Immunologic: Negative.   Neurological: Negative.  Negative for tremors and numbness.  Hematological: Negative for adenopathy. Does not bruise/bleed easily.  Psychiatric/Behavioral: Negative.  Negative for behavioral problems, sleep disturbance and suicidal ideas. The patient is not nervous/anxious.     Vital Signs: BP (!) 149/91    Pulse 90   Resp 16   Ht 5\' 9"  (1.753 m)   Wt 204 lb (92.5 kg)   SpO2 93%   BMI 30.13 kg/m    Physical Exam Vitals signs and nursing note reviewed.  Constitutional:      General: He is not in acute distress.    Appearance: He is well-developed. He is not diaphoretic.  HENT:     Head: Normocephalic and atraumatic.     Mouth/Throat:     Pharynx: No oropharyngeal exudate.  Eyes:     Pupils: Pupils are equal, round, and reactive to light.  Neck:     Musculoskeletal: Normal range of motion and neck supple.     Thyroid: No thyromegaly.     Vascular: No JVD.     Trachea: No tracheal deviation.  Cardiovascular:     Rate and Rhythm: Normal rate and regular rhythm.     Heart sounds: Normal heart sounds. No murmur. No friction rub. No gallop.   Pulmonary:     Effort: Pulmonary effort is normal. No respiratory distress.     Breath sounds: Normal breath sounds. No wheezing or rales.  Chest:     Chest wall: No tenderness.  Abdominal:     Palpations: Abdomen is soft.     Tenderness: There is no abdominal tenderness. There is no guarding.  Musculoskeletal: Normal range of motion.  Lymphadenopathy:     Cervical: No cervical adenopathy.  Skin:    General: Skin is warm and dry.  Neurological:     Mental Status: He is alert and oriented to person, place, and time.     Cranial Nerves: No cranial nerve deficit.  Psychiatric:        Behavior: Behavior normal.        Thought Content: Thought content normal.        Judgment: Judgment normal.     Assessment/Plan: 136/88 1. Transgender Patient has seen psychiatry for initial visit.  She is referring the patient for CBT in office and he has a follow-up appointment this month.  We will continue to follow after his next appointment to reevaluate plan for hormones.  2. Nicotine dependence with current use Once again encouraged patient to stop smoking. Smoking cessation counseling: 1. Pt acknowledges the risks of long term smoking, she will  try to quite smoking. 2. Options for different medications including nicotine products, chewing gum, patch etc, Wellbutrin and Chantix is discussed 3. Goal and date of compete cessation is discussed 4. Total time spent in smoking cessation is 15 min.  3. Oxygen dependent Patient continues to use 2 L of oxygen via nasal cannula at night while sleeping.  He reports sleeping well and denies any further issues.  4. Elevated blood pressure reading Repeat blood pressure was 136/88.  We will continue to monitor  at future visits.  Patient denies any history of high blood pressure.  5. Gastroesophageal reflux disease without esophagitis PT's Prilosec RX refilled at this time.   General Counseling: Clifford Michaelsdward verbalizes understanding of the findings of todays visit and agrees with plan of treatment. I have discussed any further diagnostic evaluation that may be needed or ordered today. We also reviewed his medications today. he has been encouraged to call the office with any questions or concerns that should arise related to todays visit.    No orders of the defined types were placed in this encounter.   No orders of the defined types were placed in this encounter.   Time spent: 25 Minutes   This patient was seen by Blima LedgerAdam Garek Schuneman AGNP-C in Collaboration with Dr Lyndon CodeFozia M Khan as a part of collaborative care agreement     Clifford AcostaAdam J. Abbey Burgess AGNP-C Internal medicine

## 2018-01-07 NOTE — Patient Instructions (Signed)

## 2018-01-13 ENCOUNTER — Encounter: Payer: Self-pay | Admitting: Adult Health

## 2018-01-17 ENCOUNTER — Encounter: Payer: Self-pay | Admitting: Internal Medicine

## 2018-01-17 ENCOUNTER — Ambulatory Visit (INDEPENDENT_AMBULATORY_CARE_PROVIDER_SITE_OTHER): Payer: Medicaid Other | Admitting: Internal Medicine

## 2018-01-17 VITALS — BP 128/84 | HR 81 | Resp 16 | Ht 68.0 in | Wt 204.0 lb

## 2018-01-17 DIAGNOSIS — F17210 Nicotine dependence, cigarettes, uncomplicated: Secondary | ICD-10-CM

## 2018-01-17 DIAGNOSIS — K219 Gastro-esophageal reflux disease without esophagitis: Secondary | ICD-10-CM

## 2018-01-17 DIAGNOSIS — Z9981 Dependence on supplemental oxygen: Secondary | ICD-10-CM | POA: Diagnosis not present

## 2018-01-17 DIAGNOSIS — J42 Unspecified chronic bronchitis: Secondary | ICD-10-CM

## 2018-01-17 NOTE — Progress Notes (Signed)
[facility] [address] [place], [place] [id number]  Pulmonary Sleep Medicine   Office Visit Note  Patient Name: Clifford Burgess DOB: 01-Sep-1970 MRN [id number]  Date of Service: 01/17/2018  Complaints/HPI: Overall patient is doing well.  Patient denies having any chest pain no cough no congestion at this time.  Patient does have occasional sputum production though.  Patient does have a history of tobacco use.  ROS  General: (-) fever, (-) chills, (-) night sweats, (-) weakness Skin: (-) rashes, (-) itching,. Eyes: (-) visual changes, (-) redness, (-) itching. Nose and Sinuses: (-) nasal stuffiness or itchiness, (-) postnasal drip, (-) nosebleeds, (-) sinus trouble. Mouth and Throat: (-) sore throat, (-) hoarseness. Neck: (-) swollen glands, (-) enlarged thyroid, (-) neck pain. Respiratory: + cough, (-) bloody sputum, + shortness of breath, - wheezing. Cardiovascular: - ankle swelling, (-) chest pain. Lymphatic: (-) lymph node enlargement. Neurologic: (-) numbness, (-) tingling. Psychiatric: (-) anxiety, (-) depression   Current Medication: Outpatient Encounter Medications as of 01/17/2018  Medication Sig  . buPROPion (WELLBUTRIN SR) 150 MG 12 hr tablet TAKE 1 TABLET BY MOUTH TWICE A DAY  . omeprazole (PRILOSEC) 20 MG capsule Take 1 capsule (20 mg total) by mouth daily.   No facility-administered encounter medications on file as of 01/17/2018.     Surgical History: Past Surgical History:  Procedure Laterality Date  . HERNIA REPAIR      Medical History: Past Medical History:  Diagnosis Date  . Asthma   . COPD (chronic obstructive pulmonary disease) (HCC)   . GERD (gastroesophageal reflux disease)     Family History: Family History  Family history unknown: Yes    Social History: Social History   Socioeconomic History  . Marital status: Divorced    Spouse name: Not on file  . Number of children: 2  . Years of education: Not on file  . Highest  education level: 8th grade  Occupational History  . Not on file  Social Needs  . Financial resource strain: Hard  . Food insecurity:    Worry: Often true    Inability: Often true  . Transportation needs:    Medical: No    Non-medical: No  Tobacco Use  . Smoking status: Current Every Day Smoker    Packs/day: 1.00    Types: Cigarettes  . Smokeless tobacco: Current User    Types: Chew  Substance and Sexual Activity  . Alcohol use: Yes    Alcohol/week: 2.0 standard drinks    Types: 2 Cans of beer per week  . Drug use: Not Currently    Types: Cocaine, Marijuana, Heroin, LSD, "Crack" cocaine  . Sexual activity: Not Currently  Lifestyle  . Physical activity:    Days per week: 0 days    Minutes per session: 0 min  . Stress: Not at all  Relationships  . Social connections:    Talks on phone: Not on file    Gets together: Not on file    Attends religious service: Never    Active member of club or organization: No    Attends meetings of clubs or organizations: Never    Relationship status: Divorced  . Intimate partner violence:    Fear of current or ex partner: Not on file    Emotionally abused: No    Physically abused: No    Forced sexual activity: No  Other Topics Concern  . Not on file  Social History Narrative  . Not on file  Vital Signs: Blood pressure 128/84, pulse 81, resp. rate 16, height 5\' 8"  (1.727 m), weight 204 lb (92.5 kg), SpO2 96 %.  Examination: General Appearance: The patient is well-developed, well-nourished, and in no distress. Skin: Gross inspection of skin unremarkable. Head: normocephalic, no gross deformities. Eyes: no gross deformities noted. ENT: ears appear grossly normal no exudates. Neck: Supple. No thyromegaly. No LAD. Respiratory: no rhonchi noted. Cardiovascular: Normal S1 and S2 without murmur or rub. Extremities: No cyanosis. pulses are equal. Neurologic: Alert and oriented. No involuntary movements.  LABS: Recent Results (from  the past 2160 hour(s))  UA/M w/rflx Culture, Routine     Status: None   Collection Time: 11/01/17  9:25 AM  Result Value Ref Range   Specific Gravity, UA 1.022 1.005 - 1.030   pH, UA 6.0 5.0 - 7.5   Color, UA Yellow Yellow   Appearance Ur Clear Clear   Leukocytes, UA Negative Negative   Protein, UA Negative Negative/Trace   Glucose, UA Negative Negative   Ketones, UA Negative Negative   RBC, UA Negative Negative   Bilirubin, UA Negative Negative   Urobilinogen, Ur 0.2 0.2 - 1.0 mg/dL   Nitrite, UA Negative Negative   Microscopic Examination Comment     Comment: Microscopic follows if indicated.   Microscopic Examination See below:     Comment: Microscopic was indicated and was performed.   Urinalysis Reflex Comment     Comment: This specimen will not reflex to a Urine Culture.  Microscopic Examination     Status: None   Collection Time: 11/01/17  9:25 AM  Result Value Ref Range   WBC, UA 0-5 0 - 5 /hpf   RBC, UA 0-2 0 - 2 /hpf   Epithelial Cells (non renal) None seen 0 - 10 /hpf   Casts None seen None seen /lpf   Mucus, UA Present Not Estab.   Bacteria, UA None seen None seen/Few  Pulmonary function test     Status: None   Collection Time: 11/07/17  9:30 AM  Result Value Ref Range   FEV1     FVC     FEV1/FVC     TLC     DLCO    HIV antibody (with reflex)     Status: None   Collection Time: 11/07/17 10:34 AM  Result Value Ref Range   HIV Screen 4th Generation wRfx Non Reactive Non Reactive  Hep B Core Ab W/Reflex     Status: None   Collection Time: 11/07/17 10:34 AM  Result Value Ref Range   Hep B Core Total Ab Negative Negative  Hepatitis c antibody (reflex)     Status: None   Collection Time: 11/07/17 10:34 AM  Result Value Ref Range   HCV Ab <0.1 0.0 - 0.9 s/co ratio  HCV Comment:     Status: None   Collection Time: 11/07/17 10:34 AM  Result Value Ref Range   Comment: Comment     Comment: Non reactive HCV antibody screen is consistent with no HCV  infection, unless recent infection is suspected or other evidence exists to indicate HCV infection.     Radiology: Dg Ankle Complete Left  Result Date: 10/01/2011 * PRIOR REPORT IMPORTED FROM AN EXTERNAL SYSTEM * PRIOR REPORT IMPORTED FROM THE SYNGO WORKFLOW SYSTEM REASON FOR EXAM:    pain and swelling COMMENTS: PROCEDURE:     DXR - DXR ANKLE LEFT COMPLETE  - Oct 01 2011  4:53PM RESULT:     Comparison: None Findings: 5  views of the left ankle demonstrate no fracture or dislocation. There ankle mortise is intact. There is no significant joint effusion. There is soft tissue swelling over the lateral malleolus. IMPRESSION: No acute osseous injury of the left ankle. Dictation Site: 1     No results found.  No results found.    Assessment and Plan: Patient Active Problem List   Diagnosis Date Noted  . COPD exacerbation (HCC) 11/27/2017  . GERD (gastroesophageal reflux disease) 11/27/2017  . 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 Smoker 10/15/2017  . SOB (shortness of breath) 10/15/2017  . Alcohol abuse 10/05/2017  . Drug abuse (HCC) 10/05/2017    1. Oxygen dependent nocturnal continue with oxygen therapy and supportive care. 2. GERD continue with present management 3. Tobacco abuse smoking cessation discussed at length 4. Chronic Bronchitis normal PFT with reduced DLCO likely secondary to smoking history  General Counseling: I have discussed the findings of the evaluation and examination with Ramon Dredge.  I have also discussed any further diagnostic evaluation thatmay be needed or ordered today. Nixon verbalizes understanding of the findings of todays visit. We also reviewed his medications today and discussed drug interactions and side effects including but not limited excessive drowsiness and altered mental states. We also discussed that there is always a risk not just to him but also people around him. he has been encouraged to call the office with any questions or  concerns that should arise related to todays visit.    Time spent:  I have personally obtained a history, examined the patient, evaluated laboratory and imaging results, formulated the assessment and plan and placed orders.    Yevonne Pax, MD [facility] Pulmonary and [facility] Sleep medicine

## 2018-01-17 NOTE — Patient Instructions (Signed)
Steps to Quit Smoking    Smoking tobacco can be bad for your health. It can also affect almost every organ in your body. Smoking puts you and people around you at risk for many serious long-lasting (chronic) diseases. Quitting smoking is hard, but it is one of the best things that you can do for your health. It is never too late to quit.  What are the benefits of quitting smoking?  When you quit smoking, you lower your risk for getting serious diseases and conditions. They can include:  · Lung cancer or lung disease.  · Heart disease.  · Stroke.  · Heart attack.  · Not being able to have children (infertility).  · Weak bones (osteoporosis) and broken bones (fractures).  If you have coughing, wheezing, and shortness of breath, those symptoms may get better when you quit. You may also get sick less often. If you are pregnant, quitting smoking can help to lower your chances of having a baby of low birth weight.  What can I do to help me quit smoking?  Talk with your doctor about what can help you quit smoking. Some things you can do (strategies) include:  · Quitting smoking totally, instead of slowly cutting back how much you smoke over a period of time.  · Going to in-person counseling. You are more likely to quit if you go to many counseling sessions.  · Using resources and support systems, such as:  ? Online chats with a counselor.  ? Phone quitlines.  ? Printed self-help materials.  ? Support groups or group counseling.  ? Text messaging programs.  ? Mobile phone apps or applications.  · Taking medicines. Some of these medicines may have nicotine in them. If you are pregnant or breastfeeding, do not take any medicines to quit smoking unless your doctor says it is okay. Talk with your doctor about counseling or other things that can help you.  Talk with your doctor about using more than one strategy at the same time, such as taking medicines while you are also going to in-person counseling. This can help make  quitting easier.  What things can I do to make it easier to quit?  Quitting smoking might feel very hard at first, but there is a lot that you can do to make it easier. Take these steps:  · Talk to your family and friends. Ask them to support and encourage you.  · Call phone quitlines, reach out to support groups, or work with a counselor.  · Ask people who smoke to not smoke around you.  · Avoid places that make you want (trigger) to smoke, such as:  ? Bars.  ? Parties.  ? Smoke-break areas at work.  · Spend time with people who do not smoke.  · Lower the stress in your life. Stress can make you want to smoke. Try these things to help your stress:  ? Getting regular exercise.  ? Deep-breathing exercises.  ? Yoga.  ? Meditating.  ? Doing a body scan. To do this, close your eyes, focus on one area of your body at a time from head to toe, and notice which parts of your body are tense. Try to relax the muscles in those areas.  · Download or buy apps on your mobile phone or tablet that can help you stick to your quit plan. There are many free apps, such as QuitGuide from the CDC (Centers for Disease Control and Prevention). You can find more   support from smokefree.gov and other websites.  This information is not intended to replace advice given to you by your health care provider. Make sure you discuss any questions you have with your health care provider.  Document Released: 10/22/2008 Document Revised: 08/24/2015 Document Reviewed: 05/12/2014  Elsevier Interactive Patient Education © 2019 Elsevier Inc.

## 2018-01-22 ENCOUNTER — Ambulatory Visit (INDEPENDENT_AMBULATORY_CARE_PROVIDER_SITE_OTHER): Payer: Medicaid Other | Admitting: Licensed Clinical Social Worker

## 2018-01-22 DIAGNOSIS — F1020 Alcohol dependence, uncomplicated: Secondary | ICD-10-CM

## 2018-01-22 DIAGNOSIS — F640 Transsexualism: Secondary | ICD-10-CM
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
## 2018-01-23 ENCOUNTER — Observation Stay
Admission: EM | Admit: 2018-01-23 | Discharge: 2018-01-25 | Disposition: A | Discharge status: Other Acute Inpatient Hospital | Payer: Medicaid Other | Attending: Internal Medicine | Admitting: Internal Medicine

## 2018-01-23 ENCOUNTER — Encounter: Payer: Self-pay | Admitting: Internal Medicine

## 2018-01-23 DIAGNOSIS — T5194XA Toxic effect of unspecified alcohol, undetermined, initial encounter: Secondary | ICD-10-CM | POA: Insufficient documentation

## 2018-01-23 DIAGNOSIS — K219 Gastro-esophageal reflux disease without esophagitis: Secondary | ICD-10-CM | POA: Insufficient documentation

## 2018-01-23 DIAGNOSIS — Z716 Tobacco abuse counseling: Secondary | ICD-10-CM | POA: Diagnosis not present

## 2018-01-23 DIAGNOSIS — T50904A Poisoning by unspecified drugs, medicaments and biological substances, undetermined, initial encounter: Secondary | ICD-10-CM

## 2018-01-23 DIAGNOSIS — F17210 Nicotine dependence, cigarettes, uncomplicated: Secondary | ICD-10-CM | POA: Insufficient documentation

## 2018-01-23 DIAGNOSIS — F1010 Alcohol abuse, uncomplicated: Secondary | ICD-10-CM | POA: Diagnosis present

## 2018-01-23 DIAGNOSIS — F1020 Alcohol dependence, uncomplicated: Secondary | ICD-10-CM | POA: Diagnosis not present

## 2018-01-23 DIAGNOSIS — Z79899 Other long term (current) drug therapy: Secondary | ICD-10-CM | POA: Insufficient documentation

## 2018-01-23 DIAGNOSIS — T50901A Poisoning by unspecified drugs, medicaments and biological substances, accidental (unintentional), initial encounter: Secondary | ICD-10-CM

## 2018-01-23 DIAGNOSIS — F319 Bipolar disorder, unspecified: Secondary | ICD-10-CM | POA: Insufficient documentation

## 2018-01-23 DIAGNOSIS — T43292A Poisoning by other antidepressants, intentional self-harm, initial encounter: Secondary | ICD-10-CM | POA: Diagnosis not present

## 2018-01-23 DIAGNOSIS — J449 Chronic obstructive pulmonary disease, unspecified: Secondary | ICD-10-CM | POA: Insufficient documentation

## 2018-01-23 LAB — CBC
HCT: 43.1 % (ref 39.0–52.0)
HEMOGLOBIN: 14.1 g/dL (ref 13.0–17.0)
MCH: 29.8 pg (ref 26.0–34.0)
MCHC: 32.7 g/dL (ref 30.0–36.0)
MCV: 91.1 fL (ref 80.0–100.0)
Platelets: 413 10*3/uL — ABNORMAL HIGH (ref 150–400)
RBC: 4.73 MIL/uL (ref 4.22–5.81)
RDW: 12.7 % (ref 11.5–15.5)
WBC: 9.2 10*3/uL (ref 4.0–10.5)
nRBC: 0 % (ref 0.0–0.2)

## 2018-01-23 LAB — COMPREHENSIVE METABOLIC PANEL
ALT: 16 U/L (ref 0–44)
AST: 17 U/L (ref 15–41)
Albumin: 4.4 g/dL (ref 3.5–5.0)
Alkaline Phosphatase: 62 U/L (ref 38–126)
Anion gap: 12 (ref 5–15)
BUN: 9 mg/dL (ref 6–20)
CO2: 26 mmol/L (ref 22–32)
Calcium: 9.3 mg/dL (ref 8.9–10.3)
Chloride: 100 mmol/L (ref 98–111)
Creatinine, Ser: 0.95 mg/dL (ref 0.61–1.24)
GFR calc Af Amer: >60 mL/min (ref >60)
GFR calc non Af Amer: >60 mL/min (ref >60)
GLUCOSE: 91 mg/dL (ref 70–99)
Potassium: 3.7 mmol/L (ref 3.5–5.1)
Sodium: 138 mmol/L (ref 135–145)
Total Bilirubin: 0.6 mg/dL (ref 0.3–1.2)
Total Protein: 8 g/dL (ref 6.5–8.1)

## 2018-01-23 LAB — URINE DRUG SCREEN, QUALITATIVE (ARMC ONLY)
Amphetamines, Ur Screen: NOT DETECTED
Barbiturates, Ur Screen: NOT DETECTED
Benzodiazepine, Ur Scrn: NOT DETECTED
Cannabinoid 50 Ng, Ur ~~LOC~~: NOT DETECTED
Cocaine Metabolite,Ur ~~LOC~~: NOT DETECTED
MDMA (Ecstasy)Ur Screen: NOT DETECTED
Methadone Scn, Ur: NOT DETECTED
Opiate, Ur Screen: NOT DETECTED
Phencyclidine (PCP) Ur S: NOT DETECTED
Tricyclic, Ur Screen: NOT DETECTED

## 2018-01-23 LAB — ACETAMINOPHEN LEVEL: Acetaminophen (Tylenol), Serum: <10 ug/mL — ABNORMAL LOW (ref 10–30)

## 2018-01-23 LAB — SALICYLATE LEVEL: Salicylate Lvl: <7 mg/dL (ref 2.8–30.0)

## 2018-01-23 LAB — ETHANOL: Alcohol, Ethyl (B): 131 mg/dL — ABNORMAL HIGH (ref <10)

## 2018-01-23 MED ORDER — SODIUM CHLORIDE 0.9 % IV SOLN
Freq: Once | INTRAVENOUS | Status: AC
  Administered 2018-01-23: 22:00:00 via INTRAVENOUS

## 2018-01-23 MED ORDER — SODIUM CHLORIDE 0.9 % IV SOLN
INTRAVENOUS | Status: DC
  Administered 2018-01-24: 01:00:00 via INTRAVENOUS

## 2018-01-23 NOTE — ED Notes (Signed)
ED TO INPATIENT HANDOFF REPORT  Name/Age/Gender Clifford Burgess 48 y.o. male  Code Status   Home/SNF/Other home  Chief Complaint Intoxication  Level of Care/Admitting Diagnosis ED Disposition    ED Disposition Condition Comment   Admit  Hospital Area: [facility] [[id number]]  Level of Care: Med-Surg [16]  Diagnosis: Drug overdose [202575]  Admitting Physician: Altamese DillingVACHHANI, VAIBHAVKUMAR [[id number]]  Attending Physician: Altamese DillingVACHHANI, VAIBHAVKUMAR [phone][[id number]]  PT Class (Do Not Modify): Observation [104]  PT Acc Code (Do Not Modify): Observation [10022]       Medical History Past Medical History:  Diagnosis Date  . Asthma   . COPD (chronic obstructive pulmonary disease) (HCC)   . GERD (gastroesophageal reflux disease)     Allergies No Known Allergies  IV Location/Drains/Wounds Patient Lines/Drains/Airways Status   Active Line/Drains/Airways    Name:   Placement date:   Placement time:   Site:   Days:   Peripheral IV 01/23/18 Right Forearm   01/23/18    2024    Forearm   less than 1          Labs/Imaging Results for orders placed or performed during the hospital encounter of 01/23/18 (from the past 48 hour(s))  Comprehensive metabolic panel     Status: None   Collection Time: 01/23/18  8:28 PM  Result Value Ref Range   Sodium 138 135 - 145 mmol/L   Potassium 3.7 3.5 - 5.1 mmol/L   Chloride 100 98 - 111 mmol/L   CO2 26 22 - 32 mmol/L   Glucose, Bld 91 70 - 99 mg/dL   BUN 9 6 - 20 mg/dL   Creatinine, Ser 1.910.95 0.61 - 1.24 mg/dL   Calcium 9.3 8.9 - 47.810.3 mg/dL   Total Protein 8.0 6.5 - 8.1 g/dL   Albumin 4.4 3.5 - 5.0 g/dL   AST 17 15 - 41 U/L   ALT 16 0 - 44 U/L   Alkaline Phosphatase 62 38 - 126 U/L   Total Bilirubin 0.6 0.3 - 1.2 mg/dL   GFR calc non Af Amer >60 >60 mL/min   GFR calc Af Amer >60 >60 mL/min   Anion gap 12 5 - 15    Comment: Performed at [facility], [address] [place], [place] [address]  Ethanol     Status:  Abnormal   Collection Time: 01/23/18  8:28 PM  Result Value Ref Range   Alcohol, Ethyl (B) 131 (H) <10 mg/dL    Comment: (NOTE) Lowest detectable limit for serum alcohol is 10 mg/dL. For medical purposes only. Performed at [facility], [address] [place], [place] [address]   cbc     Status: Abnormal   Collection Time: 01/23/18  8:28 PM  Result Value Ref Range   WBC 9.2 4.0 - 10.5 K/uL   RBC 4.73 4.22 - 5.81 MIL/uL   Hemoglobin 14.1 13.0 - 17.0 g/dL   HCT 57.843.1 46.939.0 - 62.952.0 %   MCV 91.1 80.0 - 100.0 fL   MCH 29.8 26.0 - 34.0 pg   MCHC 32.7 30.0 - 36.0 g/dL   RDW 52.812.7 41.311.5 - 24.415.5 %   Platelets 413 (H) 150 - 400 K/uL   nRBC 0.0 0.0 - 0.2 %    Comment: Performed at [facility], [address] [place], [place] [address]  Salicylate level     Status: None   Collection Time: 01/23/18  8:28 PM  Result Value Ref Range   Salicylate Lvl <7.0 2.8 - 30.0  mg/dL    Comment: Performed at [facility], [address] [place], [place] [address]  Acetaminophen level     Status: Abnormal   Collection Time: 01/23/18  8:28 PM  Result Value Ref Range   Acetaminophen (Tylenol), Serum <10 (L) 10 - 30 ug/mL    Comment: (NOTE) Therapeutic concentrations vary significantly. A range of 10-30 ug/mL  may be an effective concentration for many patients. However, some  are best treated at concentrations outside of this range. Acetaminophen concentrations >150 ug/mL at 4 hours after ingestion  and >50 ug/mL at 12 hours after ingestion are often associated with  toxic reactions. Performed at [facility], [address] [place], [place] [address]   Urine Drug Screen, Qualitative     Status: None   Collection Time: 01/23/18 10:08 PM  Result Value Ref Range   Tricyclic, Ur Screen NONE DETECTED NONE DETECTED   Amphetamines, Ur Screen NONE DETECTED NONE DETECTED   MDMA (Ecstasy)Ur Screen NONE DETECTED NONE DETECTED   Cocaine Metabolite,Ur [place] NONE DETECTED  NONE DETECTED   Opiate, Ur Screen NONE DETECTED NONE DETECTED   Phencyclidine (PCP) Ur S NONE DETECTED NONE DETECTED   Cannabinoid 50 Ng, Ur [place] NONE DETECTED NONE DETECTED   Barbiturates, Ur Screen NONE DETECTED NONE DETECTED   Benzodiazepine, Ur Scrn NONE DETECTED NONE DETECTED   Methadone Scn, Ur NONE DETECTED NONE DETECTED    Comment: (NOTE) Tricyclics + metabolites, urine    Cutoff 1000 ng/mL Amphetamines + metabolites, urine  Cutoff 1000 ng/mL MDMA (Ecstasy), urine              Cutoff 500 ng/mL Cocaine Metabolite, urine          Cutoff 300 ng/mL Opiate + metabolites, urine        Cutoff 300 ng/mL Phencyclidine (PCP), urine         Cutoff 25 ng/mL Cannabinoid, urine                 Cutoff 50 ng/mL Barbiturates + metabolites, urine  Cutoff 200 ng/mL Benzodiazepine, urine              Cutoff 200 ng/mL Methadone, urine                   Cutoff 300 ng/mL The urine drug screen provides only a preliminary, unconfirmed analytical test result and should not be used for non-medical purposes. Clinical consideration and professional judgment should be applied to any positive drug screen result due to possible interfering substances. A more specific alternate chemical method must be used in order to obtain a confirmed analytical result. Gas chromatography / mass spectrometry (GC/MS) is the preferred confirmat ory method. Performed at [facility], [address] [place], [place] [address]    No results found.  Pending Labs [employer] (From admission, onward)    Start     Ordered   Signed and Armed forces training and education officer morning,   R     Signed and Held   Signed and Held  CBC  Tomorrow morning,   R     Signed and Held   Signed and Held  CBC  (enoxaparin (LOVENOX)    CrCl >/= 30 ml/min)  Once,   R    Comments:  Baseline for enoxaparin therapy IF NOT ALREADY DRAWN.  Notify MD if PLT < 100 K.    Signed and Held   Signed and Held  Creatinine, serum  (enoxaparin  (  LOVENOX)    CrCl >/= 30 ml/min)  Once,   R    Comments:  Baseline for enoxaparin therapy IF NOT ALREADY DRAWN.    Signed and Held   Signed and Held  Creatinine, serum  (enoxaparin (LOVENOX)    CrCl >/= 30 ml/min)  Weekly,   R    Comments:  while on enoxaparin therapy    Signed and Held          Vitals/Pain Today's Vitals   01/23/18 2033 01/23/18 2100 01/23/18 2133 01/23/18 2200  BP:  115/69 101/74 119/68  Pulse:  90 94 95  Resp:  15 16 15   Temp:      TempSrc:      SpO2: 93% 98% 99% 98%  Weight:      Height:        Isolation Precautions No active isolations  Medications Medications  0.9 %  sodium chloride infusion ( Intravenous Rate/Dose Verify 01/23/18 2319)  0.9 %  sodium chloride infusion ( Intravenous New Bag/Given 01/23/18 2133)    Mobility Independent

## 2018-01-23 NOTE — ED Provider Notes (Signed)
[facility] Emergency Department Provider Note    ____________________________________________   I have reviewed the triage vital signs and the nursing notes.   HISTORY  Chief Complaint Drug Overdose   History limited by: Altered Mental Status   HPI Clifford Burgess is a 48 y.o. male who presents to the emergency department today because of concerns for drug overdose.  The patient states that he took 15-20 of his Wellbutrin tonight.  When asked he states he did not take it in an attempt to harm himself but cannot give a reason why he took so many.  He also states that he drank alcohol tonight.  Patient is quite sleepy on exam and cannot give further details.  Family at bedside however state that the patient does wear oxygen at nighttime.   Per medical record review patient has a history of COPD alcohol abuse  Past Medical History:  Diagnosis Date  . Asthma   . COPD (chronic obstructive pulmonary disease) (HCC)   . GERD (gastroesophageal reflux disease)     Patient Active Problem List   Diagnosis Date Noted  . COPD exacerbation (HCC) 11/27/2017  . GERD (gastroesophageal reflux disease) 11/27/2017  . 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 Smoker 10/15/2017  . SOB (shortness of breath) 10/15/2017  . Alcohol abuse 10/05/2017  . Drug abuse (HCC) 10/05/2017    Past Surgical History:  Procedure Laterality Date  . HERNIA REPAIR      Prior to Admission medications   Medication Sig Start Date End Date Taking? Authorizing Provider  buPROPion (WELLBUTRIN SR) 150 MG 12 hr tablet TAKE 1 TABLET BY MOUTH TWICE A DAY 11/29/17   Johnna Acosta, NP  omeprazole (PRILOSEC) 20 MG capsule Take 1 capsule (20 mg total) by mouth daily. 01/07/18   Johnna Acosta, NP    Allergies Patient has no known allergies.  Family History  Family history unknown: Yes    Social History Social History   Tobacco Use  . Smoking status: Current Every Day Smoker     Packs/day: 1.00    Types: Cigarettes  . Smokeless tobacco: Current User    Types: Chew  Substance Use Topics  . Alcohol use: Yes    Alcohol/week: 2.0 standard drinks    Types: 2 Cans of beer per week  . Drug use: Not Currently    Types: Cocaine, Marijuana, Heroin, LSD, "Crack" cocaine    Review of Systems Constitutional: No fever/chills Eyes: No visual changes. ENT: No sore throat. Cardiovascular: Denies chest pain. Respiratory: Denies shortness of breath. Gastrointestinal: No abdominal pain.  No nausea, no vomiting.  No diarrhea.   Genitourinary: Negative for dysuria. Musculoskeletal: Negative for back pain. Skin: Negative for rash. Neurological: Negative for headaches, focal weakness or numbness.  ____________________________________________   PHYSICAL EXAM:  VITAL SIGNS: ED Triage Vitals  Enc Vitals Group     BP 01/23/18 2008 139/75     Pulse Rate 01/23/18 2008 97     Resp 01/23/18 2008 16     Temp 01/23/18 2008 98.2 F (36.8 C)     Temp Source 01/23/18 2008 Oral     SpO2 01/23/18 2008 93 %     Weight 01/23/18 2012 203 lb 14.8 oz (92.5 kg)     Height 01/23/18 2012 5\' 8"  (1.727 m)   Constitutional: Somnolent, awakens to voice. Eyes: Conjunctivae are normal.  ENT      Head: Normocephalic and atraumatic.      Nose: No congestion/rhinnorhea.  Mouth/Throat: Mucous membranes are moist.      Neck: No stridor. Hematological/Lymphatic/Immunilogical: No cervical lymphadenopathy. Cardiovascular: Normal rate, regular rhythm.  No murmurs, rubs, or gallops.  Respiratory: Normal respiratory effort without tachypnea nor retractions. Breath sounds are clear and equal bilaterally. No wheezes/rales/rhonchi. Gastrointestinal: Soft and non tender. No rebound. No guarding.  Genitourinary: Deferred Musculoskeletal: Normal range of motion in all extremities. No lower extremity edema. Neurologic: Somnolent, awakens to voice.  Moves all extremities.  No gross focal neurologic  deficits are appreciated.  Skin:  Skin is warm, dry and intact. No rash noted.  ____________________________________________    LABS (pertinent positives/negatives)  Ethanol 131 Acetaminophen <10, salicylate <7.0 CBC wbc 9.2, hgb 14.1, plt 413 CMP wnl   ____________________________________________   EKG  I, Phineas Semen, attending physician, personally viewed and interpreted this EKG  EKG Time: 2008 Rate: 95 Rhythm: sinus rhythm Axis: normal Intervals: qtc 439 QRS: narrow, rsr' in v1, v2 ST changes: no st elevation Impression: abnormal ekg   ____________________________________________    RADIOLOGY  None  ____________________________________________   PROCEDURES  Procedures  ____________________________________________   INITIAL IMPRESSION / ASSESSMENT AND PLAN / ED COURSE  Pertinent labs & imaging results that were available during my care of the patient were reviewed by me and considered in my medical decision making (see chart for details).   Patient presented to the emergency department today after an overdose of his depression medication.  I do have concerns that this was done in an attempt to harm himself.  Because of this I did place patient under IVC.  Poison control was contacted they do recommend 24-hour observation.  Will plan admission to the hospital service.  ____________________________________________   FINAL CLINICAL IMPRESSION(S) / ED DIAGNOSES  Final diagnoses:  Drug overdose, undetermined intent, initial encounter     Note: This dictation was prepared with Dragon dictation. Any transcriptional errors that result from this process are unintentional     Phineas Semen, MD 01/23/18 2148

## 2018-01-23 NOTE — H&P (Signed)
[facility] - [place] at [facility]   PATIENT NAME: Clifford Burgess    MR#:  [id number]  DATE OF BIRTH:  11/08/1970  DATE OF ADMISSION:  01/23/2018  PRIMARY CARE PHYSICIAN: Johnna Acosta, NP   REQUESTING/REFERRING PHYSICIAN: Derrill Kay  CHIEF COMPLAINT:   Chief Complaint  Patient presents with  . Drug Overdose    HISTORY OF PRESENT ILLNESS: Clifford Burgess  is a 48 y.o. male with a known history of asthma, COPD, gastroesophageal reflux disease-took 15 to 20 tablets of Wellbutrin at home.  Also drank some alcohol after that.  And he reported this to his sister at home so she brought him to emergency room. Patient denies any complaints of abdominal pain, chest pain, shortness of breath, nausea or vomiting.  He denies any suicidal attempts but does not say clearly why he took so many tablets.  As per sister he never did overdose on any of the medications before. ER physician spoke to poison control center, they suggested to observe for 24 hours for prolonged QT interval and arrhythmias.  PAST MEDICAL HISTORY:   Past Medical History:  Diagnosis Date  . Asthma   . COPD (chronic obstructive pulmonary disease) (HCC)   . GERD (gastroesophageal reflux disease)     PAST SURGICAL HISTORY:  Past Surgical History:  Procedure Laterality Date  . HERNIA REPAIR      SOCIAL HISTORY:  Social History   Tobacco Use  . Smoking status: Current Every Day Smoker    Packs/day: 1.00    Types: Cigarettes  . Smokeless tobacco: Current User    Types: Chew  Substance Use Topics  . Alcohol use: Yes    Alcohol/week: 2.0 standard drinks    Types: 2 Cans of beer per week    FAMILY HISTORY:  Family History  Problem Relation Age of Onset  . Hypertension Father     DRUG ALLERGIES: No Known Allergies  REVIEW OF SYSTEMS:   CONSTITUTIONAL: No fever, fatigue or weakness.  EYES: No blurred or double vision.  EARS, NOSE, AND THROAT: No tinnitus or ear pain.  RESPIRATORY: No cough,  shortness of breath, wheezing or hemoptysis.  CARDIOVASCULAR: No chest pain, orthopnea, edema.  GASTROINTESTINAL: No nausea, vomiting, diarrhea or abdominal pain.  GENITOURINARY: No dysuria, hematuria.  ENDOCRINE: No polyuria, nocturia,  HEMATOLOGY: No anemia, easy bruising or bleeding SKIN: No rash or lesion. MUSCULOSKELETAL: No joint pain or arthritis.   NEUROLOGIC: No tingling, numbness, weakness.  PSYCHIATRY: No anxiety or depression.   MEDICATIONS AT HOME:  Prior to Admission medications   Medication Sig Start Date End Date Taking? Authorizing Provider  buPROPion (WELLBUTRIN SR) 150 MG 12 hr tablet TAKE 1 TABLET BY MOUTH TWICE A DAY 11/29/17   Johnna Acosta, NP  omeprazole (PRILOSEC) 20 MG capsule Take 1 capsule (20 mg total) by mouth daily. 01/07/18   Johnna Acosta, NP      PHYSICAL EXAMINATION:   VITAL SIGNS: Blood pressure 119/68, pulse 95, temperature 98.2 F (36.8 C), temperature source Oral, resp. rate 15, height 5\' 8"  (1.727 m), weight 92.5 kg, SpO2 98 %.  GENERAL:  48 y.o.-year-old patient lying in the bed with no acute distress.  EYES: Pupils equal, round, reactive to light and accommodation. No scleral icterus. Extraocular muscles intact.  HEENT: Head atraumatic, normocephalic. Oropharynx and nasopharynx clear.  NECK:  Supple, no jugular venous distention. No thyroid enlargement, no tenderness.  LUNGS: Normal breath sounds bilaterally, no wheezing, rales,rhonchi or crepitation. No use of accessory muscles  of respiration.  CARDIOVASCULAR: S1, S2 normal. No murmurs, rubs, or gallops.  ABDOMEN: Soft, nontender, nondistended. Bowel sounds present. No organomegaly or mass.  EXTREMITIES: No pedal edema, cyanosis, or clubbing.  NEUROLOGIC: Cranial nerves II through XII are intact. Muscle strength 5/5 in all extremities. Sensation intact. Gait not checked.  PSYCHIATRIC: The patient is slightly sleepy but easily arousable and oriented x 3.  He does not talk much but  answers questions appropriately.  I am not sure if he is somewhat depressed or this is just being in the hospital after having overdose of the drug and alcohol and being late night in emergency room. SKIN: No obvious rash, lesion, or ulcer.   LABORATORY PANEL:   CBC Recent Labs  Lab 01/23/18 2028  WBC 9.2  HGB 14.1  HCT 43.1  PLT 413*  MCV 91.1  MCH 29.8  MCHC 32.7  RDW 12.7   ------------------------------------------------------------------------------------------------------------------  Chemistries  Recent Labs  Lab 01/23/18 2028  NA 138  K 3.7  CL 100  CO2 26  GLUCOSE 91  BUN 9  CREATININE 0.95  CALCIUM 9.3  AST 17  ALT 16  ALKPHOS 62  BILITOT 0.6   ------------------------------------------------------------------------------------------------------------------ estimated creatinine clearance is 106.1 mL/min (by C-G formula based on SCr of 0.95 mg/dL). ------------------------------------------------------------------------------------------------------------------ No results for input(s): TSH, T4TOTAL, T3FREE, THYROIDAB in the last 72 hours.  Invalid input(s): FREET3   Coagulation profile No results for input(s): INR, PROTIME in the last 168 hours. ------------------------------------------------------------------------------------------------------------------- No results for input(s): DDIMER in the last 72 hours. -------------------------------------------------------------------------------------------------------------------  Cardiac Enzymes No results for input(s): CKMB, TROPONINI, MYOGLOBIN in the last 168 hours.  Invalid input(s): CK ------------------------------------------------------------------------------------------------------------------ Invalid input(s): POCBNP  ---------------------------------------------------------------------------------------------------------------  Urinalysis    Component Value Date/Time   APPEARANCEUR  Clear 11/01/2017 0925   GLUCOSEU Negative 11/01/2017 0925   BILIRUBINUR Negative 11/01/2017 0925   PROTEINUR Negative 11/01/2017 0925   NITRITE Negative 11/01/2017 0925   LEUKOCYTESUR Negative 11/01/2017 0925     RADIOLOGY: No results found.  EKG: Orders placed or performed during the hospital encounter of 01/23/18  . ED EKG  . ED EKG  . EKG 12-Lead  . EKG 12-Lead    IMPRESSION AND PLAN:  *Drug overdose Reportedly took 15 to 20 tablets of Wellbutrin. Monitor on telemetry. Currently on IVC Psychiatrist to see for depression or suicidal ideation.  *Alcoholism Alcohol level is more than 100. Does not appear in withdrawal currently, not a regular heavy drinker as per patient and family. Watch for any withdrawal signs.  *Active smoking Counseled to quit smoking for 4 minutes and offered nicotine patch.  All the records are reviewed and case discussed with ED provider. Management plans discussed with the patient, family and they are in agreement.  CODE STATUS: Full code    TOTAL TIME TAKING CARE OF THIS PATIENT: 45 minutes.  Patient's sister and brother-in-law were present in the room during my visit.  Altamese Dilling M.D on 01/23/2018   Between 7am to 6pm - Pager - [phone]  After 6pm go to [email or web address] - password [email or web address]  [facility]  Office  [phone]  CC: Primary care physician; Johnna Acosta, NP   Note: This dictation was prepared with Dragon dictation along with smaller phrase technology. Any transcriptional errors that result from this process are unintentional.

## 2018-01-23 NOTE — ED Triage Notes (Signed)
Per EMS pt was picked up at home after sister called 911 when pt took 20 bupropion and drank approx 6 beers. Pt denies SI pt states he took them for no reason. Pt is sleepy and sluggish but does answer questions

## 2018-01-23 NOTE — ED Notes (Signed)
Call to Poison Control. Spoke to [employer]. Recommend 24 obs with cardiac monitoring, repeat EKG in 6 hours, Tylenol level and supportive care for hypotension and seizures

## 2018-01-24 ENCOUNTER — Other Ambulatory Visit: Payer: Self-pay

## 2018-01-24 DIAGNOSIS — T50992A Poisoning by other drugs, medicaments and biological substances, intentional self-harm, initial encounter: Secondary | ICD-10-CM | POA: Diagnosis not present

## 2018-01-24 DIAGNOSIS — F314 Bipolar disorder, current episode depressed, severe, without psychotic features: Secondary | ICD-10-CM | POA: Diagnosis not present

## 2018-01-24 DIAGNOSIS — T50902A Poisoning by unspecified drugs, medicaments and biological substances, intentional self-harm, initial encounter: Secondary | ICD-10-CM

## 2018-01-24 DIAGNOSIS — F1010 Alcohol abuse, uncomplicated: Secondary | ICD-10-CM | POA: Diagnosis not present

## 2018-01-24 LAB — BASIC METABOLIC PANEL
Anion gap: 8 (ref 5–15)
BUN: 11 mg/dL (ref 6–20)
CO2: 26 mmol/L (ref 22–32)
CREATININE: 0.9 mg/dL (ref 0.61–1.24)
Calcium: 8.6 mg/dL — ABNORMAL LOW (ref 8.9–10.3)
Chloride: 105 mmol/L (ref 98–111)
GFR calc Af Amer: >60 mL/min (ref >60)
Glucose, Bld: 102 mg/dL — ABNORMAL HIGH (ref 70–99)
Potassium: 4.4 mmol/L (ref 3.5–5.1)
Sodium: 139 mmol/L (ref 135–145)

## 2018-01-24 LAB — CBC
HCT: 39.6 % (ref 39.0–52.0)
Hemoglobin: 12.7 g/dL — ABNORMAL LOW (ref 13.0–17.0)
MCH: 29.3 pg (ref 26.0–34.0)
MCHC: 32.1 g/dL (ref 30.0–36.0)
MCV: 91.5 fL (ref 80.0–100.0)
PLATELETS: 358 10*3/uL (ref 150–400)
RBC: 4.33 MIL/uL (ref 4.22–5.81)
RDW: 12.8 % (ref 11.5–15.5)
WBC: 7.3 10*3/uL (ref 4.0–10.5)
nRBC: 0 % (ref 0.0–0.2)

## 2018-01-24 MED ORDER — NICOTINE 14 MG/24HR TD PT24
14.0000 mg | MEDICATED_PATCH | Freq: Every day | TRANSDERMAL | Status: DC
  Administered 2018-01-24 – 2018-01-25 (×2): 14 mg via TRANSDERMAL
  Filled 2018-01-24 (×2): qty 1

## 2018-01-24 MED ORDER — PANTOPRAZOLE SODIUM 40 MG PO TBEC
40.0000 mg | DELAYED_RELEASE_TABLET | Freq: Every day | ORAL | Status: DC
  Administered 2018-01-24 – 2018-01-25 (×2): 40 mg via ORAL
  Filled 2018-01-24 (×2): qty 1

## 2018-01-24 MED ORDER — LORAZEPAM 2 MG/ML IJ SOLN: 2.0000 mg | INTRAMUSCULAR | Status: DC | PRN

## 2018-01-24 MED ORDER — DOCUSATE SODIUM 100 MG PO CAPS: 100.0000 mg | ORAL_CAPSULE | Freq: Two times a day (BID) | ORAL | Status: DC | PRN

## 2018-01-24 MED ORDER — ENOXAPARIN SODIUM 40 MG/0.4ML ~~LOC~~ SOLN
40.0000 mg | SUBCUTANEOUS | Status: DC
  Administered 2018-01-24 – 2018-01-25 (×2): 40 mg via SUBCUTANEOUS
  Filled 2018-01-24 (×2): qty 0.4

## 2018-01-24 MED ORDER — NICOTINE POLACRILEX 2 MG MT GUM
2.0000 mg | CHEWING_GUM | OROMUCOSAL | Status: DC | PRN
  Administered 2018-01-24: 2 mg via ORAL
  Filled 2018-01-24 (×3): qty 1

## 2018-01-24 NOTE — Plan of Care (Signed)
  Problem: Clinical Measurements: Goal: Respiratory complications will improve Outcome: Progressing Goal: Cardiovascular complication will be avoided Outcome: Progressing   Problem: Activity: Goal: Risk for activity intolerance will decrease Outcome: Progressing   Problem: Safety: Goal: Ability to remain free from injury will improve Outcome: Progressing   

## 2018-01-24 NOTE — ED Notes (Signed)
Rosey Bath sister went home. Pt and sister in agreement with password [name]

## 2018-01-24 NOTE — Consult Note (Signed)
[facility] Face-to-Face Psychiatry Consult   Reason for Consult:  Overdose Referring Physician:  Dr. Rowe Pavy Patient Identification: Clifford Burgess MRN:  [id number] Principal Diagnosis: Drug overdose Diagnosis:  Principal Problem:   Drug overdose Active Problems:   Alcohol abuse  Please note patient identifies as a male name preference is Liza.  Total Time spent with patient: 45 minutes  Subjective:   "I just want to have my phone.  This place is f---ing stupid."  HPI: Clifford Burgess  is a 48 y.o. male with a known history of asthma, COPD, gastroesophageal reflux disease-took 15 to 20 tablets of Wellbutrin at home.  Also drank some alcohol after that.  And he reported this to his sister at home so she brought him to emergency room. Patient denies any complaints of abdominal pain, chest pain, shortness of breath, nausea or vomiting.  He denies any suicidal attempts but does not say clearly why he took so many tablets.  As per sister he never did overdose on any of the medications before. ER physician spoke to poison control center, they suggested to observe for 24 hours for prolonged QT interval and arrhythmias.  Collateral from sitter who has been in room notes that when family asked patient he said he tried to kill himself.  When they asked why, he stated he had his reasons.  Patient is seen in bed, patient's sister and brother-in-law are at bedside with patient's permission and to provide collateral.  Patient reports a longstanding history of alcohol abuse since age 76.  He endorses that he drinks every day anywhere from 1 beer to a case of beer depending on his moods.  He reports he has been trying to cut back on alcohol.  He notes that he has had a period of approximately 1 month where he has not used alcohol when he was in jail.  Patient does not elaborate as to why he was in jail.  Patient will not discuss whether he has any pending charges.  Patient describes that he was "kicked out by his ex  wife 2 years ago in [place]."  He states he had a significant other after leaving his wife, but "I kicked his ass out."  Patient states he has been living with his sister for the past 2 years.  [facility], and is being prescribed Wellbutrin 150 mg twice daily.  He denies that he is ever been on any mood stabilizer medication despite his diagnosis of bipolar.  Patient is guarded and angry.  He refuses to answer many questions and minimizes the overdose.  Sister reports that [name] contacted her after he overdosed because he had chest pain.  Patient endorses depression.  He denies any episodes of mania or hypomania.  He denies any auditory or visual hallucinations.  He is currently denying any suicidal and homicidal ideation.  Past Psychiatric History: Bipolar disorder  Risk to Self:  Yes Risk to Others:  Denies Prior Inpatient Therapy:  Denies Prior Outpatient Therapy:  Denies  Past Medical History:  Past Medical History:  Diagnosis Date  . Asthma   . COPD (chronic obstructive pulmonary disease) (HCC)   . GERD (gastroesophageal reflux disease)     Past Surgical History:  Procedure Laterality Date  . HERNIA REPAIR     Family History:  Family History  Problem Relation Age of Onset  . Hypertension Father    Family Psychiatric  History: Does not respond  Social History:  Social History   Substance and Sexual Activity  Alcohol Use Yes  . Alcohol/week: 2.0 standard drinks  . Types: 2 Cans of beer per week     Social History   Substance and Sexual Activity  Drug Use Not Currently  . Types: Cocaine, Marijuana, Heroin, LSD, "Crack" cocaine    Social History   Socioeconomic History  . Marital status: Divorced    Spouse name: Not on file  . Number of children: 2  . Years of education: Not on file  . Highest education level: 8th grade  Occupational History  . Not on file  Social Needs  . Financial resource  strain: Hard  . Food insecurity:    Worry: Often true    Inability: Often true  . Transportation needs:    Medical: No    Non-medical: No  Tobacco Use  . Smoking status: Current Every Day Smoker    Packs/day: 1.00    Types: Cigarettes  . Smokeless tobacco: Current User    Types: Chew  Substance and Sexual Activity  . Alcohol use: Yes    Alcohol/week: 2.0 standard drinks    Types: 2 Cans of beer per week  . Drug use: Not Currently    Types: Cocaine, Marijuana, Heroin, LSD, "Crack" cocaine  . Sexual activity: Not Currently  Lifestyle  . Physical activity:    Days per week: 0 days    Minutes per session: 0 min  . Stress: Not at all  Relationships  . Social connections:    Talks on phone: Not on file    Gets together: Not on file    Attends religious service: Never    Active member of club or organization: No    Attends meetings of clubs or organizations: Never    Relationship status: Divorced  Other Topics Concern  . Not on file  Social History Narrative  . Not on file   Additional Social History:  Last worked in 1980. Lives with sister and brother-in-law. Has a 10 year old son with first wife Has a 57-year-old daughter with current wife No relationship at this time Patient denies substance use other than alcohol.  Allergies:  No Known Allergies  Labs:  Results for orders placed or performed during the hospital encounter of 01/23/18 (from the past 48 hour(s))  Comprehensive metabolic panel     Status: None   Collection Time: 01/23/18  8:28 PM  Result Value Ref Range   Sodium 138 135 - 145 mmol/L   Potassium 3.7 3.5 - 5.1 mmol/L   Chloride 100 98 - 111 mmol/L   CO2 26 22 - 32 mmol/L   Glucose, Bld 91 70 - 99 mg/dL   BUN 9 6 - 20 mg/dL   Creatinine, Ser 1.61 0.61 - 1.24 mg/dL   Calcium 9.3 8.9 - 09.6 mg/dL   Total Protein 8.0 6.5 - 8.1 g/dL   Albumin 4.4 3.5 - 5.0 g/dL   AST 17 15 - 41 U/L   ALT 16 0 - 44 U/L   Alkaline Phosphatase 62 38 - 126 U/L   Total  Bilirubin 0.6 0.3 - 1.2 mg/dL   GFR calc non Af Amer >60 >60 mL/min   GFR calc Af Amer >60 >60 mL/min   Anion gap 12 5 - 15    Comment: Performed at [facility], [address], [place], [place] [address]  Ethanol     Status: Abnormal   Collection Time: 01/23/18  8:28 PM  Result Value  Ref Range   Alcohol, Ethyl (B) 131 (H) <10 mg/dL    Comment: (NOTE) Lowest detectable limit for serum alcohol is 10 mg/dL. For medical purposes only. Performed at [facility], [address] [place], [place] [address]   cbc     Status: Abnormal   Collection Time: 01/23/18  8:28 PM  Result Value Ref Range   WBC 9.2 4.0 - 10.5 K/uL   RBC 4.73 4.22 - 5.81 MIL/uL   Hemoglobin 14.1 13.0 - 17.0 g/dL   HCT 40.8 14.4 - 81.8 %   MCV 91.1 80.0 - 100.0 fL   MCH 29.8 26.0 - 34.0 pg   MCHC 32.7 30.0 - 36.0 g/dL   RDW 56.3 14.9 - 70.2 %   Platelets 413 (H) 150 - 400 K/uL   nRBC 0.0 0.0 - 0.2 %    Comment: Performed at [facility], [address], [place], [place] [address]  Salicylate level     Status: None   Collection Time: 01/23/18  8:28 PM  Result Value Ref Range   Salicylate Lvl <7.0 2.8 - 30.0 mg/dL    Comment: Performed at [facility], [address] [place], [place] [address]  Acetaminophen level     Status: Abnormal   Collection Time: 01/23/18  8:28 PM  Result Value Ref Range   Acetaminophen (Tylenol), Serum <10 (L) 10 - 30 ug/mL    Comment: (NOTE) Therapeutic concentrations vary significantly. A range of 10-30 ug/mL  may be an effective concentration for many patients. However, some  are best treated at concentrations outside of this range. Acetaminophen concentrations >150 ug/mL at 4 hours after ingestion  and >50 ug/mL at 12 hours after ingestion are often associated with  toxic reactions. Performed at [facility], [address] [place], [place] [address]   Urine Drug Screen, Qualitative     Status: None   Collection Time: 01/23/18  10:08 PM  Result Value Ref Range   Tricyclic, Ur Screen NONE DETECTED NONE DETECTED   Amphetamines, Ur Screen NONE DETECTED NONE DETECTED   MDMA (Ecstasy)Ur Screen NONE DETECTED NONE DETECTED   Cocaine Metabolite,Ur [place] NONE DETECTED NONE DETECTED   Opiate, Ur Screen NONE DETECTED NONE DETECTED   Phencyclidine (PCP) Ur S NONE DETECTED NONE DETECTED   Cannabinoid 50 Ng, Ur [place] NONE DETECTED NONE DETECTED   Barbiturates, Ur Screen NONE DETECTED NONE DETECTED   Benzodiazepine, Ur Scrn NONE DETECTED NONE DETECTED   Methadone Scn, Ur NONE DETECTED NONE DETECTED    Comment: (NOTE) Tricyclics + metabolites, urine    Cutoff 1000 ng/mL Amphetamines + metabolites, urine  Cutoff 1000 ng/mL MDMA (Ecstasy), urine              Cutoff 500 ng/mL Cocaine Metabolite, urine          Cutoff 300 ng/mL Opiate + metabolites, urine        Cutoff 300 ng/mL Phencyclidine (PCP), urine         Cutoff 25 ng/mL Cannabinoid, urine                 Cutoff 50 ng/mL Barbiturates + metabolites, urine  Cutoff 200 ng/mL Benzodiazepine, urine              Cutoff 200 ng/mL Methadone, urine                   Cutoff 300 ng/mL The urine drug screen provides only a preliminary, unconfirmed analytical test result and should not  be used for non-medical purposes. Clinical consideration and professional judgment should be applied to any positive drug screen result due to possible interfering substances. A more specific alternate chemical method must be used in order to obtain a confirmed analytical result. Gas chromatography / mass spectrometry (GC/MS) is the preferred confirmat ory method. Performed at [facility], [address] [place], [place] [address]   Basic metabolic panel     Status: Abnormal   Collection Time: 01/24/18  5:01 AM  Result Value Ref Range   Sodium 139 135 - 145 mmol/L   Potassium 4.4 3.5 - 5.1 mmol/L   Chloride 105 98 - 111 mmol/L   CO2 26 22 - 32 mmol/L   Glucose, Bld 102 (H) 70 - 99  mg/dL   BUN 11 6 - 20 mg/dL   Creatinine, Ser 6.040.90 0.61 - 1.24 mg/dL   Calcium 8.6 (L) 8.9 - 10.3 mg/dL   GFR calc non Af Amer >60 >60 mL/min   GFR calc Af Amer >60 >60 mL/min   Anion gap 8 5 - 15    Comment: Performed at [facility], [address] [place], [place] [address]  CBC     Status: Abnormal   Collection Time: 01/24/18  5:01 AM  Result Value Ref Range   WBC 7.3 4.0 - 10.5 K/uL   RBC 4.33 4.22 - 5.81 MIL/uL   Hemoglobin 12.7 (L) 13.0 - 17.0 g/dL   HCT 11.939.6 14.739.0 - 82.952.0 %   MCV 91.5 80.0 - 100.0 fL   MCH 29.3 26.0 - 34.0 pg   MCHC 32.1 30.0 - 36.0 g/dL   RDW 56.212.8 13.011.5 - 86.515.5 %   Platelets 358 150 - 400 K/uL   nRBC 0.0 0.0 - 0.2 %    Comment: Performed at [facility], [address] [place], [place] [address]    Current Facility-Administered Medications  Medication Dose Route Frequency Provider Last Rate Last Dose  . 0.9 %  sodium chloride infusion   Intravenous Continuous Altamese DillingVachhani, Vaibhavkumar, MD 75 mL/hr at 01/24/18 0300    . docusate sodium (COLACE) capsule 100 mg  100 mg Oral BID PRN Altamese DillingVachhani, Vaibhavkumar, MD      . enoxaparin (LOVENOX) injection 40 mg  40 mg Subcutaneous Q24H Altamese DillingVachhani, Vaibhavkumar, MD   40 mg at 01/24/18 0055  . LORazepam (ATIVAN) injection 2 mg  2 mg Intravenous Q4H PRN Altamese DillingVachhani, Vaibhavkumar, MD      . pantoprazole (PROTONIX) EC tablet 40 mg  40 mg Oral Daily Altamese DillingVachhani, Vaibhavkumar, MD   40 mg at 01/24/18 [id number]    Musculoskeletal: Strength & Muscle Tone: within normal limits Gait & Station: normal Patient leans: N/A  Psychiatric Specialty Exam: Physical Exam  Nursing note and vitals reviewed. Constitutional: He appears well-developed and well-nourished. No distress.  HENT:  Head: Normocephalic and atraumatic.  Eyes: EOM are normal.  Neck: Normal range of motion.  Cardiovascular: Normal rate.  Respiratory: Effort normal.  Musculoskeletal: Normal range of motion.  Neurological: He is alert.    Review of Systems   Constitutional: Negative.   HENT: Negative.   Respiratory: Negative.   Cardiovascular: Negative for chest pain and palpitations.  Gastrointestinal: Positive for abdominal pain.  Musculoskeletal: Negative.   Neurological: Negative.   Psychiatric/Behavioral: Positive for substance abuse (alcohol) and suicidal ideas. Negative for depression and hallucinations. The patient is not nervous/anxious and does not have insomnia.     Blood pressure 137/73, pulse 87, temperature 97.8 F (36.6 C), temperature source  Oral, resp. rate 19, height 5\' 8"  (1.727 m), weight 91.6 kg, SpO2 97 %.Body mass index is 30.71 kg/m.  General Appearance: Casual  Eye Contact:  Minimal  Speech:  Pressured and minimal  Volume:  Normal  Mood:  Depressed and Irritable  Affect:  Flat  Thought Process:  Coherent  Orientation:  Full (Time, Place, and Person)  Thought Content:  Hallucinations: None and Rumination  Suicidal Thoughts:  Yes.  with intent/plan  Homicidal Thoughts:  No  Memory:  fair  Judgement:  Impaired  Insight:  Shallow  Psychomotor Activity:  Restlessness  Concentration:  Concentration: Fair  Recall:  [employer]  Fund of Knowledge:  Fair  Language:  Fair  Akathisia:  No  Handed:  Right  AIMS (if indicated):   n/a  Assets:  Social Support  ADL's:  Intact  Cognition:  WNL  Sleep:   decreased     Treatment Plan Summary: Daily contact with patient to assess and evaluate symptoms and progress in treatment and Plan Patient requests nicotine replacement.  Will defer further psychiatric medication management to primary treatment team.  Disposition: Recommend psychiatric Inpatient admission when medically cleared. Supportive therapy provided about ongoing stressors. Continue involuntary commitment and sitter while on medical floor.  Mariel CraftSHEILA M MAURER, MD 01/24/2018 9:20 AM

## 2018-01-24 NOTE — Progress Notes (Signed)
Can be transferred to [facility] after 6 PM if bed available

## 2018-01-24 NOTE — Discharge Instructions (Signed)
Regular diet

## 2018-01-24 NOTE — Discharge Summary (Addendum)
[employer] - [place] [facility]   PATIENT NAME: Clifford Burgess    MR#:  [id number]  DATE OF BIRTH:  1970-03-08  DATE OF ADMISSION:  01/23/2018 ADMITTING PHYSICIAN: Altamese Dilling, MD  DATE OF DISCHARGE: 01/25/2018  PRIMARY CARE PHYSICIAN: Johnna Acosta, NP   ADMISSION DIAGNOSIS:  Drug overdose, undetermined intent, initial encounter [T50.904A]  DISCHARGE DIAGNOSIS:  Principal Problem:   Drug overdose Active Problems:   Alcohol abuse   SECONDARY DIAGNOSIS:   Past Medical History:  Diagnosis Date  . Asthma   . COPD (chronic obstructive pulmonary disease) (HCC)   . GERD (gastroesophageal reflux disease)      ADMITTING HISTORY  HISTORY OF PRESENT ILLNESS: Clifford Burgess  is a 48 y.o. male with a known history of asthma, COPD, gastroesophageal reflux disease-took 15 to 20 tablets of Wellbutrin at home.  Also drank some alcohol after that.  And he reported this to his sister at home so she brought him to emergency room. Patient denies any complaints of abdominal pain, chest pain, shortness of breath, nausea or vomiting.  He denies any suicidal attempts but does not say clearly why he took so many tablets.  As per sister he never did overdose on any of the medications before. ER physician spoke to poison control center, they suggested to observe for 24 hours for prolonged QT interval and arrhythmias.   HOSPITAL COURSE:   *Drug overdose with attempted suicide and severe depression Reportedly took 15 to 20 tablets of Wellbutrin. Monitored on telemetry. IVC placed. Sitted at bedside Dr. Viviano Simas saw patient and advised transfer to [facility] when medically stable. QT normal. Tele shows no arrhythmias. Patient will be transferred to behavioral health unit now that he is medically stable  *Alcoholism Alcohol level was more than 100 on admission Does not appear in withdrawal currently, not a regular heavy drinker as per patient and family.  *Active  smoking Counseled to quit smoking on admission  Discharge to behavioral health unit  CONSULTS OBTAINED:  Treatment Team:  Mariel Craft, MD  DRUG ALLERGIES:  No Known Allergies  DISCHARGE MEDICATIONS:   Allergies as of 01/24/2018   No Known Allergies     Medication List    TAKE these medications   buPROPion 150 MG 12 hr tablet Commonly known as:  WELLBUTRIN SR TAKE 1 TABLET BY MOUTH TWICE A DAY   omeprazole 20 MG capsule Commonly known as:  PRILOSEC Take 1 capsule (20 mg total) by mouth daily.       Today   VITAL SIGNS:  Blood pressure 128/79, pulse 64, temperature 97.8 F (36.6 C), temperature source Oral, resp. rate 19, height 5\' 8"  (1.727 m), weight 91.6 kg, SpO2 96 %.  I/O:    Intake/Output Summary (Last 24 hours) at 01/24/2018 1526 Last data filed at 01/24/2018 1300 Gross per 24 hour  Intake 875.95 ml  Output 1725 ml  Net -849.05 ml    PHYSICAL EXAMINATION:  Physical Exam  GENERAL:  48 y.o.-year-old patient lying in the bed with no acute distress.  LUNGS: Normal breath sounds bilaterally, no wheezing, rales,rhonchi or crepitation. No use of accessory muscles of respiration.  CARDIOVASCULAR: S1, S2 normal. No murmurs, rubs, or gallops.  ABDOMEN: Soft, non-tender, non-distended. Bowel sounds present. No organomegaly or mass.  NEUROLOGIC: Moves all 4 extremities. PSYCHIATRIC: The patient is alert and oriented x 3.  SKIN: No obvious rash, lesion, or ulcer.   DATA REVIEW:   CBC Recent Labs  Lab 01/24/18 0501  WBC 7.3  HGB 12.7*  HCT 39.6  PLT 358    Chemistries  Recent Labs  Lab 01/23/18 2028 01/24/18 0501  NA 138 139  K 3.7 4.4  CL 100 105  CO2 26 26  GLUCOSE 91 102*  BUN 9 11  CREATININE 0.95 0.90  CALCIUM 9.3 8.6*  AST 17  --   ALT 16  --   ALKPHOS 62  --   BILITOT 0.6  --     Cardiac Enzymes No results for input(s): TROPONINI in the last 168 hours.  Microbiology Results  Results for orders placed or performed in visit  on 11/01/17  Microscopic Examination     Status: None   Collection Time: 11/01/17  9:25 AM  Result Value Ref Range Status   WBC, UA 0-5 0 - 5 /hpf Final   RBC, UA 0-2 0 - 2 /hpf Final   Epithelial Cells (non renal) None seen 0 - 10 /hpf Final   Casts None seen None seen /lpf Final   Mucus, UA Present Not Estab. Final   Bacteria, UA None seen None seen/Few Final    RADIOLOGY:  No results found.  Follow up with PCP in 1 week.  Management plans discussed with the patient, family and they are in agreement.  CODE STATUS:     Code Status Orders  (From admission, onward)         Start     Ordered   01/24/18 0029  Full code  Continuous     01/24/18 0028        Code Status History    This patient has a current code status but no historical code status.      TOTAL TIME TAKING CARE OF THIS PATIENT ON DAY OF DISCHARGE: more than 30 minutes.   Molinda Bailiff Marqual Mi M.D on 01/25/2018 at 8:10 AM  Between 7am to 6pm - Pager - [phone]  After 6pm go to [email or web address] - password [email or web address] [facility]  [facility]  Office  [phone]  CC: Primary care physician; Johnna Acosta, NP  Note: This dictation was prepared with Dragon dictation along with smaller phrase technology. Any transcriptional errors that result from this process are unintentional.

## 2018-01-25 ENCOUNTER — Telehealth: Payer: Self-pay

## 2018-01-25 ENCOUNTER — Inpatient Hospital Stay
Admission: AD | Admit: 2018-01-25 | Discharge: 2018-01-25 | DRG: 897 | Disposition: A | Discharge status: Home / Self Care | Payer: Medicaid Other | Source: Intra-hospital | Attending: Psychiatry | Admitting: Psychiatry

## 2018-01-25 ENCOUNTER — Other Ambulatory Visit: Payer: Self-pay

## 2018-01-25 DIAGNOSIS — T50901A Poisoning by unspecified drugs, medicaments and biological substances, accidental (unintentional), initial encounter: Secondary | ICD-10-CM | POA: Diagnosis present

## 2018-01-25 DIAGNOSIS — F17210 Nicotine dependence, cigarettes, uncomplicated: Secondary | ICD-10-CM | POA: Diagnosis present

## 2018-01-25 DIAGNOSIS — Z9981 Dependence on supplemental oxygen: Secondary | ICD-10-CM

## 2018-01-25 DIAGNOSIS — F314 Bipolar disorder, current episode depressed, severe, without psychotic features: Secondary | ICD-10-CM | POA: Diagnosis present

## 2018-01-25 DIAGNOSIS — R1010 Upper abdominal pain, unspecified: Secondary | ICD-10-CM | POA: Diagnosis not present

## 2018-01-25 DIAGNOSIS — T43291A Poisoning by other antidepressants, accidental (unintentional), initial encounter: Secondary | ICD-10-CM | POA: Diagnosis present

## 2018-01-25 DIAGNOSIS — F1010 Alcohol abuse, uncomplicated: Principal | ICD-10-CM | POA: Diagnosis present

## 2018-01-25 DIAGNOSIS — J449 Chronic obstructive pulmonary disease, unspecified: Secondary | ICD-10-CM | POA: Diagnosis present

## 2018-01-25 DIAGNOSIS — K219 Gastro-esophageal reflux disease without esophagitis: Secondary | ICD-10-CM | POA: Diagnosis present

## 2018-01-25 DIAGNOSIS — T43292A Poisoning by other antidepressants, intentional self-harm, initial encounter: Secondary | ICD-10-CM | POA: Diagnosis not present

## 2018-01-25 MED ORDER — MAGNESIUM HYDROXIDE 400 MG/5ML PO SUSP: 30.0000 mL | Freq: Every day | ORAL | Status: DC | PRN

## 2018-01-25 MED ORDER — DOCUSATE SODIUM 100 MG PO CAPS: 100.0000 mg | ORAL_CAPSULE | Freq: Two times a day (BID) | ORAL | Status: DC | PRN

## 2018-01-25 MED ORDER — ACETAMINOPHEN 325 MG PO TABS: 650.0000 mg | ORAL_TABLET | Freq: Four times a day (QID) | ORAL | Status: DC | PRN

## 2018-01-25 MED ORDER — PANTOPRAZOLE SODIUM 40 MG PO TBEC: 40.0000 mg | DELAYED_RELEASE_TABLET | Freq: Every day | ORAL | Status: DC

## 2018-01-25 MED ORDER — HYDROXYZINE HCL 50 MG PO TABS: 50.0000 mg | ORAL_TABLET | ORAL | Status: DC | PRN

## 2018-01-25 MED ORDER — NICOTINE 14 MG/24HR TD PT24: 14.0000 mg | MEDICATED_PATCH | Freq: Every day | TRANSDERMAL | Status: DC

## 2018-01-25 MED ORDER — ENOXAPARIN SODIUM 40 MG/0.4ML ~~LOC~~ SOLN: 40.0000 mg | SUBCUTANEOUS | Status: DC

## 2018-01-25 MED ORDER — ALUM & MAG HYDROXIDE-SIMETH 200-200-20 MG/5ML PO SUSP: 30.0000 mL | ORAL | Status: DC | PRN

## 2018-01-25 MED ORDER — NICOTINE POLACRILEX 2 MG MT GUM: 2.0000 mg | CHEWING_GUM | OROMUCOSAL | Status: DC | PRN

## 2018-01-25 NOTE — BHH Suicide Risk Assessment (Signed)
[facility] Admission Suicide Risk Assessment   Nursing information obtained from:  Patient Demographic factors:  Male, Divorced or widowed, Clifford Burgess, lesbian, or bisexual orientation Current Mental Status:  NA Loss Factors:  Loss of significant relationship, Legal issues Historical Factors:  NA Risk Reduction Factors:  Living with another person, especially a relative  Total Time spent with patient: 1 hour Principal Problem: Substance-induced mood disorder Diagnosis:  Active Problems:   Bipolar 1 disorder, depressed, severe (HCC)  Subjective Data: Patient seen chart reviewed.  Patient denies any suicidal thought wish or plan.  Indicates that she finds her life quite good and satisfying.  Able to name multiple things she is looking forward to.  No sign of psychosis.  Patient admits that alcohol abuse has proved to be a problem and shows reasonable insight about this.  Continued Clinical Symptoms:  Alcohol Use Disorder Identification Test Final Score (AUDIT): 3 The "Alcohol Use Disorders Identification Test", Guidelines for Use in Primary Care, Second Edition.  World Science writer [facility]). Score between 0-7:  no or low risk or alcohol related problems. Score between 8-15:  moderate risk of alcohol related problems. Score between 16-19:  high risk of alcohol related problems. Score 20 or above:  warrants further diagnostic evaluation for alcohol dependence and treatment.   CLINICAL FACTORS:   Alcohol/Substance Abuse/Dependencies   Musculoskeletal: Strength & Muscle Tone: within normal limits Gait & Station: normal Patient leans: N/A  Psychiatric Specialty Exam: Physical Exam  Nursing note and vitals reviewed. Constitutional: He appears well-developed and well-nourished.  HENT:  Head: Normocephalic and atraumatic.  Eyes: Pupils are equal, round, and reactive to light. Conjunctivae are normal.  Neck: Normal range of motion.  Cardiovascular: Regular rhythm and normal heart sounds.   Respiratory: Effort normal. No respiratory distress.  GI: Soft.  Musculoskeletal: Normal range of motion.  Neurological: He is alert.  Skin: Skin is warm and dry.  Psychiatric: He has a normal mood and affect. His speech is normal and behavior is normal. Judgment and thought content normal. He exhibits abnormal recent memory.    Review of Systems  Constitutional: Negative.   HENT: Negative.   Eyes: Negative.   Respiratory: Negative.   Cardiovascular: Negative.   Gastrointestinal: Negative.   Musculoskeletal: Negative.   Skin: Negative.   Neurological: Negative.   Psychiatric/Behavioral: Positive for memory loss and substance abuse. Negative for depression, hallucinations and suicidal ideas. The patient is not nervous/anxious and does not have insomnia.     Blood pressure (!) 139/92, pulse 100, temperature 97.9 F (36.6 C), temperature source Oral, height 5\' 8"  (1.727 m), weight 89.8 kg, SpO2 97 %.Body mass index is 30.11 kg/m.  General Appearance: Casual  Eye Contact:  Good  Speech:  Clear and Coherent  Volume:  Normal  Mood:  Euthymic  Affect:  Congruent  Thought Process:  Goal Directed  Orientation:  Full (Time, Place, and Person)  Thought Content:  Logical  Suicidal Thoughts:  No  Homicidal Thoughts:  No  Memory:  Immediate;   Fair Recent;   Poor Remote;   Fair  Judgement:  Fair  Insight:  Fair  Psychomotor Activity:  Normal  Concentration:  Concentration: Fair  Recall:  [employer] of Knowledge:  Fair  Language:  Fair  Akathisia:  No  Handed:  Right  AIMS (if indicated):     Assets:  Desire for Improvement Housing Physical Health Resilience Social Support  ADL's:  Intact  Cognition:  WNL  Sleep:  COGNITIVE FEATURES THAT CONTRIBUTE TO RISK:  Loss of executive function    SUICIDE RISK:   Minimal: No identifiable suicidal ideation.  Patients presenting with no risk factors but with morbid ruminations; may be classified as minimal risk based on  the severity of the depressive symptoms  PLAN OF CARE: Patient admitted to the psychiatric ward in transfer from the medical service where she had been stabilized after an overdose on bupropion.  Patient today is showing no signs or symptoms of major depression.  Has consistently denied any suicidal intent.  Admits that the overdose was foolish and cannot give any rational reason for it other than an issue being drunk.  Shows reasonable insight about this.  Patient at this point no longer meets commitment criteria does not appear to necessarily need hospitalization.  We are going to discharge her home and she already has follow-up care in place.  I certify that inpatient services furnished can reasonably be expected to improve the patient's condition.   Mordecai RasmussenJohn Clapacs, MD 01/25/2018, 5:53 PM

## 2018-01-25 NOTE — Progress Notes (Signed)
Patient discharged to home with sister with all belongings. Denies SI, HI and AV hallucinations and contracts for safety.

## 2018-01-25 NOTE — Plan of Care (Signed)
  Problem: Clinical Measurements: Goal: Diagnostic test results will improve Outcome: Progressing Goal: Cardiovascular complication will be avoided Outcome: Progressing   Problem: Safety: Goal: Ability to remain free from injury will improve Outcome: Progressing   Problem: Education: Goal: Knowledge of warning signs, risks, and behaviors that relate to suicide ideation and self-harm behaviors will improve Outcome: Progressing   Problem: Health Behavior/Discharge (Transition) Planning: Goal: Ability to manage health-related needs will improve Outcome: Progressing  Patient has been A&Ox4. She has not had any withdrawal symptoms.

## 2018-01-25 NOTE — BHH Suicide Risk Assessment (Signed)
[facility] Discharge Suicide Risk Assessment   Principal Problem: Substance-induced mood disorder Discharge Diagnoses: Active Problems:   Bipolar 1 disorder, depressed, severe (HCC)   Total Time spent with patient: 1 hour  Musculoskeletal: Strength & Muscle Tone: within normal limits Gait & Station: normal Patient leans: N/A  Psychiatric Specialty Exam: Review of Systems  Constitutional: Negative.   HENT: Negative.   Eyes: Negative.   Respiratory: Negative.   Cardiovascular: Negative.   Gastrointestinal: Negative.   Musculoskeletal: Negative.   Skin: Negative.   Neurological: Negative.   Psychiatric/Behavioral: Positive for memory loss and substance abuse. Negative for depression, hallucinations and suicidal ideas. The patient is not nervous/anxious and does not have insomnia.     Blood pressure (!) 139/92, pulse 100, temperature 97.9 F (36.6 C), temperature source Oral, height 5\' 8"  (1.727 m), weight 89.8 kg, SpO2 97 %.Body mass index is 30.11 kg/m.  General Appearance: Casual  Eye Contact::  Good  Speech:  Normal Rate409  Volume:  Normal  Mood:  Euthymic  Affect:  Congruent  Thought Process:  Goal Directed  Orientation:  Full (Time, Place, and Person)  Thought Content:  Logical  Suicidal Thoughts:  No  Homicidal Thoughts:  No  Memory:  Immediate;   Fair Recent;   Poor Remote;   Fair  Judgement:  Fair  Insight:  Fair  Psychomotor Activity:  Normal  Concentration:  Fair  Recall:  Poor  Fund of Knowledge:Fair  Language: Fair  Akathisia:  No  Handed:  Right  AIMS (if indicated):     Assets:  Architect Housing Social Support  Sleep:     Cognition: WNL  ADL's:  Intact   Mental Status Per Nursing Assessment::   On Admission:  NA  Demographic Factors:  Male, Caucasian and Gay, lesbian, or bisexual orientation  Loss Factors: Substance abuse  Historical Factors: Impulsivity  Risk Reduction Factors:   Religious  beliefs about death, Living with another person, especially a relative, Positive social support and Positive therapeutic relationship  Continued Clinical Symptoms:  Alcohol/Substance Abuse/Dependencies  Cognitive Features That Contribute To Risk:  Loss of executive function    Suicide Risk:  Minimal: No identifiable suicidal ideation.  Patients presenting with no risk factors but with morbid ruminations; may be classified as minimal risk based on the severity of the depressive symptoms    Plan Of Care/Follow-up recommendations:  Activity:  Activity as tolerated Diet:  Regular diet Other:  Follow-up with substance abuse and mental health counseling as already in place  Mordecai Rasmussen, MD 01/25/2018, 5:57 PM

## 2018-01-25 NOTE — Progress Notes (Signed)
Spoke with poison control. They stated that they are closing their case on Clifford Burgess.

## 2018-01-25 NOTE — Progress Notes (Signed)
Pt admitted after overdosing on Wellbutrin. Patient denies that the overdose was a suicide attempt. Pt is pleasant and animated. Denies SI, HI, and AV hallucinations. Denies any substance abuse. Denies triggers for overdose. Contracts for safety.

## 2018-01-25 NOTE — BH Assessment (Signed)
Patient is to be admitted to [facility] by Dr. Viviano Simas.  Attending Physician will be Dr. Jennet Maduro.   Patient has been assigned to room 305, by [facility] Charge Nurse T'Yawn.   Intake Paper Work has been signed and placed on patient chart.  Staff is aware of the admission:   Crystal, Patient's Nurse   Ethelene Browns, Patient Access.

## 2018-01-25 NOTE — Tx Team (Signed)
Initial Treatment Plan 01/25/2018 3:29 PM Clifford Burgess KZL:[id number]    PATIENT STRESSORS: Loss of SPOUSE Marital or family conflict   PATIENT STRENGTHS: Ability for insight Active sense of humor   PATIENT IDENTIFIED PROBLEMS: DEPRESSION OVERDOSE                     DISCHARGE CRITERIA:  Ability to meet basic life and health needs Adequate post-discharge living arrangements Improved stabilization in mood, thinking, and/or behavior  PRELIMINARY DISCHARGE PLAN: Attend aftercare/continuing care group  PATIENT/FAMILY INVOLVEMENT: This treatment plan has been presented to and reviewed with the patient, Clifford Burgess, and/or family member, .  The patient and family have been given the opportunity to ask questions and make suggestions.  Billy Coast, RN 01/25/2018, 3:29 PM

## 2018-01-25 NOTE — H&P (Signed)
Psychiatric Admission Assessment Adult  Patient Identification: Clifford Burgess MRN:  [id number] Date of Evaluation:  01/25/2018 Chief Complaint:  bipolar Principal Diagnosis: Overdose Diagnosis:  Active Problems:   Bipolar 1 disorder, depressed, severe (HCC)  History of Present Illness: Patient was transferred from the medical service where she had been admitted after an overdose of bupropion.  Patient was drinking heavily and remembers taking an excessive number of bupropion tablets.  He then called his sister for help.  He was admitted to the medical service and monitored for any ill effects from this.  Patient has consistently denied any suicidal intent being involved.  He cannot give any rational excuses for it other than that he was drunk and sometimes does impulsive things.  Patient denies being depressed.  Denies any recent feelings of depression or hopelessness.  Denies any psychosis denies any thoughts about harming anyone else.  He very strongly denies any suicidal ideation currently.  He admits that he binge drinks 2 or 3 times a week and that when he does he has a tendency to act out.  He has been getting involved in substance abuse and mental health treatment recently.  Medical history: Patient has COPD and is oxygen dependent at night.  Otherwise does not appear to have major medical problems.  Social history: Receives disability.  Lives with his sister and brother-in-law  Substance abuse history: Long history of abuse of alcohol although she says she has been able to maintain sobriety sometimes for up to 3 years at a time.  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 Associated Signs/Symptoms: Depression Symptoms:  anxiety, (Hypo) Manic Symptoms:  None Anxiety Symptoms:  None Psychotic Symptoms:  None PTSD Symptoms: Negative Total Time spent with patient: 1 hour  Past Psychiatric History: Patient denies previous psychiatric hospitalization.  Denies any past suicide  attempts.  Has recently been on Wellbutrin and feels like it might of been partially helpful.  Denies having any side effects.  Has had other antidepressants in the past but has little memory about them.  Has had substance abuse treatment in the past but it has been a while.  Is the patient at risk to self? Yes.    Has the patient been a risk to self in the past 6 months? Yes.    Has the patient been a risk to self within the distant past? Yes.    Is the patient a risk to others? No.  Has the patient been a risk to others in the past 6 months? No.  Has the patient been a risk to others within the distant past? No.   Prior Inpatient Therapy:   Prior Outpatient Therapy:    Alcohol Screening: 1. How often do you have a drink containing alcohol?: 2 to 4 times a month 2. How many drinks containing alcohol do you have on a typical day when you are drinking?: 1 or 2 3. How often do you have six or more drinks on one occasion?: Never AUDIT-C Score: 2 4. How often during the last year have you found that you were not able to stop drinking once you had started?: Less than monthly 5. How often during the last year have you failed to do what was normally expected from you becasue of drinking?: Never 6. How often during the last year have you needed a first drink in the morning to get yourself going after a heavy drinking session?: Never 7. How often during the last year have  you had a feeling of guilt of remorse after drinking?: Never 8. How often during the last year have you been unable to remember what happened the night before because you had been drinking?: Never 9. Have you or someone else been injured as a result of your drinking?: No 10. Has a relative or friend or a doctor or another health worker been concerned about your drinking or suggested you cut down?: No Alcohol Use Disorder Identification Test Final Score (AUDIT): 3 Substance Abuse History in the last 12 months:  Yes.   Consequences of  Substance Abuse: Medical Consequences:  Overdose of bupropion Previous Psychotropic Medications: Yes  Psychological Evaluations: Yes  Past Medical History:  Past Medical History:  Diagnosis Date  . Asthma   . COPD (chronic obstructive pulmonary disease) (HCC)   . GERD (gastroesophageal reflux disease)     Past Surgical History:  Procedure Laterality Date  . HERNIA REPAIR     Family History:  Family History  Problem Relation Age of Onset  . Hypertension Father    Family Psychiatric  History: Denies any Tobacco Screening: Have you used any form of tobacco in the last 30 days? (Cigarettes, Smokeless Tobacco, Cigars, and/or Pipes): Yes Tobacco use, Select all that apply: 5 or more cigarettes per day Are you interested in Tobacco Cessation Medications?: Yes, will notify MD for an order Social History:  Social History   Substance and Sexual Activity  Alcohol Use Yes  . Alcohol/week: 2.0 standard drinks  . Types: 2 Cans of beer per week     Social History   Substance and Sexual Activity  Drug Use Not Currently  . Types: Cocaine, Marijuana, Heroin, LSD, "Crack" cocaine    Additional Social History:                           Allergies:   Allergies  Allergen Reactions  . Chocolate    Lab Results:  Results for orders placed or performed during the hospital encounter of 01/23/18 (from the past 48 hour(s))  Comprehensive metabolic panel     Status: None   Collection Time: 01/23/18  8:28 PM  Result Value Ref Range   Sodium 138 135 - 145 mmol/L   Potassium 3.7 3.5 - 5.1 mmol/L   Chloride 100 98 - 111 mmol/L   CO2 26 22 - 32 mmol/L   Glucose, Bld 91 70 - 99 mg/dL   BUN 9 6 - 20 mg/dL   Creatinine, Ser 1.61 0.61 - 1.24 mg/dL   Calcium 9.3 8.9 - 09.6 mg/dL   Total Protein 8.0 6.5 - 8.1 g/dL   Albumin 4.4 3.5 - 5.0 g/dL   AST 17 15 - 41 U/L   ALT 16 0 - 44 U/L   Alkaline Phosphatase 62 38 - 126 U/L   Total Bilirubin 0.6 0.3 - 1.2 mg/dL   GFR calc non Af Amer  >60 >60 mL/min   GFR calc Af Amer >60 >60 mL/min   Anion gap 12 5 - 15    Comment: Performed at [facility], [address] [place], [place] [address]  Ethanol     Status: Abnormal   Collection Time: 01/23/18  8:28 PM  Result Value Ref Range   Alcohol, Ethyl (B) 131 (H) <10 mg/dL    Comment: (NOTE) Lowest detectable limit for serum alcohol is 10 mg/dL. For medical purposes only. Performed at [facility], [address] [place], [place] [address]  cbc     Status: Abnormal   Collection Time: 01/23/18  8:28 PM  Result Value Ref Range   WBC 9.2 4.0 - 10.5 K/uL   RBC 4.73 4.22 - 5.81 MIL/uL   Hemoglobin 14.1 13.0 - 17.0 g/dL   HCT 24.4 01.0 - 27.2 %   MCV 91.1 80.0 - 100.0 fL   MCH 29.8 26.0 - 34.0 pg   MCHC 32.7 30.0 - 36.0 g/dL   RDW 53.6 64.4 - 03.4 %   Platelets 413 (H) 150 - 400 K/uL   nRBC 0.0 0.0 - 0.2 %    Comment: Performed at [facility], [address] [place], [place] [address]  Salicylate level     Status: None   Collection Time: 01/23/18  8:28 PM  Result Value Ref Range   Salicylate Lvl <7.0 2.8 - 30.0 mg/dL    Comment: Performed at [facility] [address] [place], [place] [address]  Acetaminophen level     Status: Abnormal   Collection Time: 01/23/18  8:28 PM  Result Value Ref Range   Acetaminophen (Tylenol), Serum <10 (L) 10 - 30 ug/mL    Comment: (NOTE) Therapeutic concentrations vary significantly. A range of 10-30 ug/mL  may be an effective concentration for many patients. However, some  are best treated at concentrations outside of this range. Acetaminophen concentrations >150 ug/mL at 4 hours after ingestion  and >50 ug/mL at 12 hours after ingestion are often associated with  toxic reactions. Performed at [facility], [address] [place], [place] [address]   Urine Drug Screen, Qualitative     Status: None   Collection Time: 01/23/18 10:08 PM  Result Value Ref Range   Tricyclic, Ur  Screen NONE DETECTED NONE DETECTED   Amphetamines, Ur Screen NONE DETECTED NONE DETECTED   MDMA (Ecstasy)Ur Screen NONE DETECTED NONE DETECTED   Cocaine Metabolite,Ur [place] NONE DETECTED NONE DETECTED   Opiate, Ur Screen NONE DETECTED NONE DETECTED   Phencyclidine (PCP) Ur S NONE DETECTED NONE DETECTED   Cannabinoid 50 Ng, Ur [place] NONE DETECTED NONE DETECTED   Barbiturates, Ur Screen NONE DETECTED NONE DETECTED   Benzodiazepine, Ur Scrn NONE DETECTED NONE DETECTED   Methadone Scn, Ur NONE DETECTED NONE DETECTED    Comment: (NOTE) Tricyclics + metabolites, urine    Cutoff 1000 ng/mL Amphetamines + metabolites, urine  Cutoff 1000 ng/mL MDMA (Ecstasy), urine              Cutoff 500 ng/mL Cocaine Metabolite, urine          Cutoff 300 ng/mL Opiate + metabolites, urine        Cutoff 300 ng/mL Phencyclidine (PCP), urine         Cutoff 25 ng/mL Cannabinoid, urine                 Cutoff 50 ng/mL Barbiturates + metabolites, urine  Cutoff 200 ng/mL Benzodiazepine, urine              Cutoff 200 ng/mL Methadone, urine                   Cutoff 300 ng/mL The urine drug screen provides only a preliminary, unconfirmed analytical test result and should not be used for non-medical purposes. Clinical consideration and professional judgment should be applied to any positive drug screen result due to possible interfering substances. A more specific alternate chemical method must be used in order to obtain a confirmed analytical result. Gas chromatography /  mass spectrometry (GC/MS) is the preferred confirmat ory method. Performed at [facility], [address] [place], [place] [address]   Basic metabolic panel     Status: Abnormal   Collection Time: 01/24/18  5:01 AM  Result Value Ref Range   Sodium 139 135 - 145 mmol/L   Potassium 4.4 3.5 - 5.1 mmol/L   Chloride 105 98 - 111 mmol/L   CO2 26 22 - 32 mmol/L   Glucose, Bld 102 (H) 70 - 99 mg/dL   BUN 11 6 - 20 mg/dL   Creatinine, Ser 8.29  0.61 - 1.24 mg/dL   Calcium 8.6 (L) 8.9 - 10.3 mg/dL   GFR calc non Af Amer >60 >60 mL/min   GFR calc Af Amer >60 >60 mL/min   Anion gap 8 5 - 15    Comment: Performed at [facility], [address] [place], [place] [address]  CBC     Status: Abnormal   Collection Time: 01/24/18  5:01 AM  Result Value Ref Range   WBC 7.3 4.0 - 10.5 K/uL   RBC 4.33 4.22 - 5.81 MIL/uL   Hemoglobin 12.7 (L) 13.0 - 17.0 g/dL   HCT 08.6 57.8 - 46.9 %   MCV 91.5 80.0 - 100.0 fL   MCH 29.3 26.0 - 34.0 pg   MCHC 32.1 30.0 - 36.0 g/dL   RDW 62.9 52.8 - 41.3 %   Platelets 358 150 - 400 K/uL   nRBC 0.0 0.0 - 0.2 %    Comment: Performed at [facility], [address] [place], [place] [address]    Blood Alcohol level:  Lab Results  Component Value Date   ETH 131 (H) 01/23/2018    Metabolic Disorder Labs:  No results found for: HGBA1C, MPG No results found for: PROLACTIN Lab Results  Component Value Date   CHOL 189 10/15/2017   TRIG 135 10/15/2017   HDL 48 10/15/2017   LDLCALC 114 (H) 10/15/2017    Current Medications: Current Facility-Administered Medications  Medication Dose Route Frequency Provider Last Rate Last Dose  . acetaminophen (TYLENOL) tablet 650 mg  650 mg Oral Q6H PRN Mariel Craft, MD      . alum & mag hydroxide-simeth (MAALOX/MYLANTA) 200-200-20 MG/5ML suspension 30 mL  30 mL Oral Q4H PRN Mariel Craft, MD      . docusate sodium (COLACE) capsule 100 mg  100 mg Oral BID PRN Mariel Craft, MD      . hydrOXYzine (ATARAX/VISTARIL) tablet 50 mg  50 mg Oral Q4H PRN Mariel Craft, MD      . magnesium hydroxide (MILK OF MAGNESIA) suspension 30 mL  30 mL Oral Daily PRN Mariel Craft, MD      . nicotine (NICODERM CQ - dosed in mg/24 hours) patch 14 mg  14 mg Transdermal Daily Mariel Craft, MD      . nicotine polacrilex (NICORETTE) gum 2 mg  2 mg Oral PRN Mariel Craft, MD      . Melene Muller ON 01/26/2018] pantoprazole (PROTONIX) EC tablet 40 mg  40 mg  Oral QAC breakfast Mariel Craft, MD       PTA Medications: Medications Prior to Admission  Medication Sig Dispense Refill Last Dose  . buPROPion (WELLBUTRIN SR) 150 MG 12 hr tablet TAKE 1 TABLET BY MOUTH TWICE A DAY 60 tablet 1 01/23/2018 at 1830  . omeprazole (PRILOSEC) 20 MG capsule Take 1 capsule (20 mg total) by mouth daily. 30  capsule 3 Past Week at Unknown time    Musculoskeletal: Strength & Muscle Tone: within normal limits Gait & Station: normal Patient leans: N/A  Psychiatric Specialty Exam: Physical Exam  Nursing note and vitals reviewed. Constitutional: He appears well-developed and well-nourished.  HENT:  Head: Normocephalic and atraumatic.  Eyes: Pupils are equal, round, and reactive to light. Conjunctivae are normal.  Neck: Normal range of motion.  Cardiovascular: Regular rhythm and normal heart sounds.  Respiratory: Effort normal. No respiratory distress.  GI: Soft.  Musculoskeletal: Normal range of motion.  Neurological: He is alert.  Skin: Skin is warm and dry.  Psychiatric: He has a normal mood and affect. His behavior is normal. Judgment and thought content normal.    Review of Systems  Constitutional: Negative.   HENT: Negative.   Eyes: Negative.   Respiratory: Negative.   Cardiovascular: Negative.   Gastrointestinal: Negative.   Musculoskeletal: Negative.   Skin: Negative.   Neurological: Negative.   Psychiatric/Behavioral: Positive for memory loss and substance abuse. Negative for depression, hallucinations and suicidal ideas. The patient is not nervous/anxious and does not have insomnia.     Blood pressure (!) 139/92, pulse 100, temperature 97.9 F (36.6 C), temperature source Oral, height 5\' 8"  (1.727 m), weight 89.8 kg, SpO2 97 %.Body mass index is 30.11 kg/m.  General Appearance: Casual  Eye Contact:  Fair  Speech:  Clear and Coherent  Volume:  Normal  Mood:  Euthymic  Affect:  Congruent  Thought Process:  Coherent  Orientation:  Full  (Time, Place, and Person)  Thought Content:  Logical  Suicidal Thoughts:  No  Homicidal Thoughts:  No  Memory:  Immediate;   Fair Recent;   Poor Remote;   Fair  Judgement:  Fair  Insight:  Fair  Psychomotor Activity:  Normal  Concentration:  Concentration: Fair  Recall:  [employer] of Knowledge:  Fair  Language:  Fair  Akathisia:  No  Handed:  Right  AIMS (if indicated):     Assets:  Desire for Improvement Housing Leisure Time Resilience Social Support  ADL's:  Intact  Cognition:  WNL  Sleep:       Treatment Plan Summary: Plan Patient was admitted from the medical service where she has been for the last couple days.  Patient was intoxicated on admission but is clearly sober now.  She has consistently denied any suicidal ideation.  Denies any symptoms of depression.  Has not been acting out or trying to harm herself.  Review of the chart does not show any signs of depression just before the overdose.  Patient appears to have been largely the victim of her own intoxication.  There is nothing we are likely to be able to do in the short-term in the hospital to make a significant difference to that.  Right now the patient is not meeting commitment criteria.  Agrees to outpatient substance abuse and mental health treatment.  Patient will be taken off IV C and discharged from the hospital with strong encouragement to quit drinking  Observation Level/Precautions:  15 minute checks  Laboratory:  Chemistry Profile  Psychotherapy:    Medications:    Consultations:    Discharge Concerns:    Estimated LOS:  Other:     Physician Treatment Plan for Primary Diagnosis: <principal problem not specified> Long Term Goal(s): Improvement in symptoms so as ready for discharge  Short Term Goals: Ability to verbalize feelings will improve and Ability to disclose and discuss suicidal ideas  Physician Treatment  Plan for Secondary Diagnosis: Active Problems:   Bipolar 1 disorder, depressed, severe  (HCC)  Long Term Goal(s): Improvement in symptoms so as ready for discharge  Short Term Goals: Ability to identify triggers associated with substance abuse/mental health issues will improve  I certify that inpatient services furnished can reasonably be expected to improve the patient's condition.    Mordecai RasmussenJohn Shahad Mazurek, MD 1/17/20205:59 PM

## 2018-01-25 NOTE — Discharge Summary (Signed)
Physician Discharge Summary Note  Patient:  Clifford Burgess is an 48 y.o., male MRN:  [id number] DOB:  1970/10/21 Patient phone:  [phone] (home)  Patient address:   [address] [place] [address],  Total Time spent with patient: 1 hour  Date of Admission:  01/25/2018 Date of Discharge: January 17th 2020  Reason for Admission: Patient admitted in transfer from the medical service because of recent overdose on Wellbutrin and alcohol abuse  Principal Problem: Alcohol abuse Discharge Diagnoses: Principal Problem:   Alcohol abuse Active Problems:   Dependence on nocturnal oxygen therapy   Drug overdose   Past Psychiatric History: Past history of longstanding substance abuse problems.  No previous suicide attempts.  Some history of depression.  No clear history of mania or psychosis.  Past Medical History:  Past Medical History:  Diagnosis Date  . Asthma   . COPD (chronic obstructive pulmonary disease) (HCC)   . GERD (gastroesophageal reflux disease)     Past Surgical History:  Procedure Laterality Date  . HERNIA REPAIR     Family History:  Family History  Problem Relation Age of Onset  . Hypertension Father    Family Psychiatric  History: Denies Social History:  Social History   Substance and Sexual Activity  Alcohol Use Yes  . Alcohol/week: 2.0 standard drinks  . Types: 2 Cans of beer per week     Social History   Substance and Sexual Activity  Drug Use Not Currently  . Types: Cocaine, Marijuana, Heroin, LSD, "Crack" cocaine    Social History   Socioeconomic History  . Marital status: Divorced    Spouse name: Not on file  . Number of children: 2  . Years of education: Not on file  . Highest education level: 8th grade  Occupational History  . Not on file  Social Needs  . Financial resource strain: Hard  . Food insecurity:    Worry: Often true    Inability: Often true  . Transportation needs:    Medical: No    Non-medical: No   Tobacco Use  . Smoking status: Current Every Day Smoker    Packs/day: 1.00    Types: Cigarettes  . Smokeless tobacco: Current User    Types: Chew  Substance and Sexual Activity  . Alcohol use: Yes    Alcohol/week: 2.0 standard drinks    Types: 2 Cans of beer per week  . Drug use: Not Currently    Types: Cocaine, Marijuana, Heroin, LSD, "Crack" cocaine  . Sexual activity: Not Currently  Lifestyle  . Physical activity:    Days per week: 0 days    Minutes per session: 0 min  . Stress: Not at all  Relationships  . Social connections:    Talks on phone: Not on file    Gets together: Not on file    Attends religious service: Never    Active member of club or organization: No    Attends meetings of clubs or organizations: Never    Relationship status: Divorced  Other Topics Concern  . Not on file  Social History Narrative  . Not on file    Hospital Course: Patient was admitted to the medical service from the emergency room after presenting intoxicated and with an overdose on Wellbutrin.  Patient has consistently denied any suicidal thoughts intent or plan.  She has been cooperative with treatment in the hospital.  Today on interview the patient is of course sober without any signs of detox or withdrawal.  Absolutely denies suicidal thoughts.  Articulates multiple positive plans for the future.  Articulate some understanding of how alcohol abuse has contributed to dangerous behavior and agrees that she is planning to follow-up with outpatient substance abuse treatment.  Patient does not appear to meet commitment criteria and does not appear to require further inpatient hospitalization particularly given that she is already engaged with a psychiatrist and counselor in the community.  Patient will be discharged from the psychiatric service at this point to continue outpatient care.  She expresses agreement with the plan.  Physical Findings: AIMS: Facial and Oral Movements Muscles of  Facial Expression: None, normal Lips and Perioral Area: None, normal Jaw: None, normal Tongue: None, normal,Extremity Movements Upper (arms, wrists, hands, fingers): None, normal Lower (legs, knees, ankles, toes): None, normal, Trunk Movements Neck, shoulders, hips: None, normal, Overall Severity Severity of abnormal movements (highest score from questions above): None, normal Incapacitation due to abnormal movements: None, normal Patient's awareness of abnormal movements (rate only patient's report): No Awareness, Dental Status Current problems with teeth and/or dentures?: Yes(no teeth) Does patient usually wear dentures?: No  CIWA:  CIWA-Ar Total: 0 COWS:  COWS Total Score: 0  Musculoskeletal: Strength & Muscle Tone: within normal limits Gait & Station: normal Patient leans: N/A  Psychiatric Specialty Exam: Physical Exam  Nursing note and vitals reviewed. Constitutional: He appears well-developed and well-nourished.  HENT:  Head: Normocephalic and atraumatic.  Eyes: Pupils are equal, round, and reactive to light. Conjunctivae are normal.  Neck: Normal range of motion.  Cardiovascular: Regular rhythm and normal heart sounds.  Respiratory: Effort normal. No respiratory distress.  GI: Soft.  Musculoskeletal: Normal range of motion.  Neurological: He is alert.  Skin: Skin is warm and dry.  Psychiatric: He has a normal mood and affect. His speech is normal and behavior is normal. Judgment and thought content normal. He exhibits abnormal recent memory.    Review of Systems  Constitutional: Negative.   HENT: Negative.   Eyes: Negative.   Respiratory: Negative.   Cardiovascular: Negative.   Gastrointestinal: Negative.   Musculoskeletal: Negative.   Skin: Negative.   Neurological: Negative.   Psychiatric/Behavioral: Positive for substance abuse. Negative for depression, hallucinations and suicidal ideas. The patient is not nervous/anxious.     Blood pressure (!) 139/92, pulse  100, temperature 97.9 F (36.6 C), temperature source Oral, height 5\' 8"  (1.727 m), weight 89.8 kg, SpO2 97 %.Body mass index is 30.11 kg/m.  General Appearance: Casual  Eye Contact:  Good  Speech:  Clear and Coherent  Volume:  Normal  Mood:  Euthymic  Affect:  Congruent  Thought Process:  Goal Directed  Orientation:  Full (Time, Place, and Person)  Thought Content:  Logical  Suicidal Thoughts:  No  Homicidal Thoughts:  No  Memory:  Immediate;   Fair Recent;   Fair Remote;   Fair  Judgement:  Fair  Insight:  Fair  Psychomotor Activity:  Normal  Concentration:  Concentration: Fair  Recall:  [employer] of Knowledge:  Fair  Language:  Fair  Akathisia:  No  Handed:  Right  AIMS (if indicated):     Assets:  Desire for Improvement Housing Resilience Social Support  ADL's:  Intact  Cognition:  WNL  Sleep:        Have you used any form of tobacco in the last 30 days? (Cigarettes, Smokeless Tobacco, Cigars, and/or Pipes): Yes  Has this patient used any form of tobacco in the last 30 days? (Cigarettes, Smokeless  Tobacco, Cigars, and/or Pipes) Yes, Yes, A prescription for an FDA-approved tobacco cessation medication was offered at discharge and the patient refused  Blood Alcohol level:  Lab Results  Component Value Date   ETH 131 (H) 01/23/2018    Metabolic Disorder Labs:  No results found for: HGBA1C, MPG No results found for: PROLACTIN Lab Results  Component Value Date   CHOL 189 10/15/2017   TRIG 135 10/15/2017   HDL 48 10/15/2017   LDLCALC 114 (H) 10/15/2017    See Psychiatric Specialty Exam and Suicide Risk Assessment completed by Attending Physician prior to discharge.  Discharge destination:  Home  Is patient on multiple antipsychotic therapies at discharge:  No   Has Patient had three or more failed trials of antipsychotic monotherapy by history:  No  Recommended Plan for Multiple Antipsychotic Therapies: NA  Discharge Instructions    Diet - low sodium  heart healthy   Complete by:  As directed    Increase activity slowly   Complete by:  As directed      Allergies as of 01/25/2018      Reactions   Chocolate       Medication List    TAKE these medications     Indication  buPROPion 150 MG 12 hr tablet Commonly known as:  WELLBUTRIN SR TAKE 1 TABLET BY MOUTH TWICE A DAY  Indication:  Major Depressive Disorder   omeprazole 20 MG capsule Commonly known as:  PRILOSEC Take 1 capsule (20 mg total) by mouth daily.  Indication:  Gastroesophageal Reflux Disease        Follow-up recommendations:  Activity:  Activity as tolerated Diet:  Regular diet regular diet Other:  Follow-up with outpatient psychiatric and substance abuse treatment as planned  Comments: Patient appears to at this point be stable and without suicidal ideation and without major mood or psychotic symptoms.  Expresses understanding of the need to work on substance abuse treatment.  Risks outweigh benefits of continued psychiatric hospitalization.  Discontinue IVC.  Patient will be discharged back to his home with follow-up in the community.  Signed: Mordecai RasmussenJohn Marcene Laskowski, MD 01/25/2018, 6:13 PM

## 2018-01-25 NOTE — Telephone Encounter (Signed)
Pt sister called that brother is in hospital for behavioral health and she concerned about his breathing as per dr Welton Flakes I advised pt sister that she can call medical dr for consultation.

## 2018-01-28 ENCOUNTER — Ambulatory Visit: Payer: Medicaid Other | Admitting: Psychiatry

## 2018-01-29 ENCOUNTER — Ambulatory Visit (INDEPENDENT_AMBULATORY_CARE_PROVIDER_SITE_OTHER): Payer: Medicaid Other | Admitting: Psychiatry

## 2018-01-29 ENCOUNTER — Encounter: Payer: Self-pay | Admitting: Psychiatry

## 2018-01-29 ENCOUNTER — Other Ambulatory Visit: Payer: Self-pay

## 2018-01-29 ENCOUNTER — Other Ambulatory Visit: Payer: Self-pay | Admitting: Adult Health

## 2018-01-29 VITALS — BP 126/77 | HR 73 | Temp 98.0°F | Wt 203.8 lb

## 2018-01-29 DIAGNOSIS — F332 Major depressive disorder, recurrent severe without psychotic features: Secondary | ICD-10-CM

## 2018-01-29 DIAGNOSIS — F1020 Alcohol dependence, uncomplicated: Secondary | ICD-10-CM | POA: Diagnosis not present

## 2018-01-29 DIAGNOSIS — F640 Transsexualism: Secondary | ICD-10-CM | POA: Diagnosis not present

## 2018-01-29 DIAGNOSIS — F17200 Nicotine dependence, unspecified, uncomplicated: Secondary | ICD-10-CM

## 2018-01-29 NOTE — Progress Notes (Signed)
BH MD OP Progress Note  01/29/2018 5:44 PM Clifford Burgess  MRN:  [id number]  Chief Complaint: ' I am here for follow up." Chief Complaint    Follow-up     HPI: Clifford Burgess is a 48 year old trans-man gender male to male, on disability, lives with sister in [place], has a history of tobacco use disorder, alcohol use disorder, COPD, GERD, presented to the clinic today for a follow-up visit.  Patient today reports she is doing well on the current medication regimen.  She denies any significant side effects.  She has been on the Wellbutrin which helps with her mood symptoms.  She denies any significant sadness, crying spells, suicidality.  She denies any anxiety symptoms.  Patient has been trying to cut down on alcohol intake and reports it is going well.  Patient continues to be in psychotherapy with Ms. Peacock on a regular basis.  She plans to continue it.  She has not started the hormone therapy yet.  She reports she has upcoming appointment with her providers.  Patient continues to smoke cigarettes.  She is not ready to quit at this time.   Visit Diagnosis:    ICD-10-CM   1. 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 Alcohol use disorder, moderate, dependence (HCC) F10.20    improving  3. Tobacco use disorder F17.200     Past Psychiatric History: I have reviewed past psychiatric history from my progress note on 11/27/2017  Past Medical History:  Past Medical History:  Diagnosis Date  . Asthma   . COPD (chronic obstructive pulmonary disease) (HCC)   . GERD (gastroesophageal reflux disease)     Past Surgical History:  Procedure Laterality Date  . HERNIA REPAIR      Family Psychiatric History: I have reviewed family psychiatric history from my progress note on 11/27/2017.  Family History:  Family History  Problem Relation Age of Onset  . Hypertension Father     Social History: Reviewed social history from my progress note on 11/27/2017. Social History    Socioeconomic History  . Marital status: Divorced    Spouse name: Not on file  . Number of children: 2  . Years of education: Not on file  . Highest education level: 8th grade  Occupational History  . Not on file  Social Needs  . Financial resource strain: Hard  . Food insecurity:    Worry: Often true    Inability: Often true  . Transportation needs:    Medical: No    Non-medical: No  Tobacco Use  . Smoking status: Current Every Day Smoker    Packs/day: 1.00    Types: Cigarettes  . Smokeless tobacco: Current User    Types: Chew  Substance and Sexual Activity  . Alcohol use: Yes    Alcohol/week: 2.0 standard drinks    Types: 2 Cans of beer per week  . Drug use: Not Currently    Types: Cocaine, Marijuana, Heroin, LSD, "Crack" cocaine  . Sexual activity: Not Currently  Lifestyle  . Physical activity:    Days per week: 0 days    Minutes per session: 0 min  . Stress: Not at all  Relationships  . Social connections:    Talks on phone: Not on file    Gets together: Not on file    Attends religious service: Never    Active member of club or organization: No    Attends meetings of clubs or organizations: Never    Relationship status: Divorced  Other Topics Concern  . Not on file  Social History Narrative  . Not on file    Allergies:  Allergies  Allergen Reactions  . Chocolate     Metabolic Disorder Labs: No results found for: HGBA1C, MPG No results found for: PROLACTIN Lab Results  Component Value Date   CHOL 189 10/15/2017   TRIG 135 10/15/2017   HDL 48 10/15/2017   LDLCALC 114 (H) 10/15/2017   Lab Results  Component Value Date   TSH 1.130 10/15/2017    Therapeutic Level Labs: No results found for: LITHIUM No results found for: VALPROATE No components found for:  CBMZ  Current Medications: Current Outpatient Medications  Medication Sig Dispense Refill  . buPROPion (WELLBUTRIN SR) 150 MG 12 hr tablet TAKE 1 TABLET BY MOUTH TWICE A DAY 60 tablet  1  . omeprazole (PRILOSEC) 20 MG capsule Take 1 capsule (20 mg total) by mouth daily. 30 capsule 3   No current facility-administered medications for this visit.      Musculoskeletal: Strength & Muscle Tone: within normal limits Gait & Station: normal Patient leans: N/A  Psychiatric Specialty Exam: Review of Systems  Psychiatric/Behavioral: Negative for depression. The patient is not nervous/anxious.   All other systems reviewed and are negative.   Blood pressure 126/77, pulse 73, temperature 98 F (36.7 C), temperature source Oral, weight 203 lb 12.8 oz (92.4 kg).Body mass index is 30.99 kg/m.  General Appearance: Casual  Eye Contact:  Fair  Speech:  Clear and Coherent  Volume:  Normal  Mood:  Euthymic  Affect:  Congruent  Thought Process:  Goal Directed and Descriptions of Associations: Intact  Orientation:  Full (Time, Place, and Person)  Thought Content: Logical   Suicidal Thoughts:  No  Homicidal Thoughts:  No  Memory:  Immediate;   Fair Recent;   Fair Remote;   Fair  Judgement:  Fair  Insight:  Fair  Psychomotor Activity:  Normal  Concentration:  Concentration: Fair and Attention Span: Fair  Recall:  [employer] of Knowledge: Fair  Language: Fair  Akathisia:  No  Handed:  Right  AIMS (if indicated): Denies tremors, rigidity, stiffness  Assets:  Communication Skills Desire for Improvement Social Support  ADL's:  Intact  Cognition: WNL  Sleep:  Fair   Screenings: AIMS     Admission (Discharged) from 01/25/2018 in [facility]PATIENT BE[facility]ICINE  AIMS Total Score  0    AUDIT     Admission (Discharged) from 01/25/2018 in [facility] INPATIENT BEHAVIORAL MEDICINE Office Visit from 01/07/2018 in [facility], [facility] Office Visit from 10/05/2017 in [facility], [facility]  Alcohol Use Disorder Identification Test Final Score (AUDIT)  3  6  31     PHQ2-9     Office Visit from 01/07/2018 in [facility], [facility] Office Visit from 10/05/2017 in  [facility], [facility]  PHQ-2 Total Score  0  2  PHQ-9 Total Score  -  7       Assessment and Plan: Clifford Burgess is a 48 year old Caucasian male to male transgender who is divorced, on SSD, has a history of alcohol use disorder, tobacco use disorder, COPD, GERD, presented to the clinic today for a follow-up visit.  Patient reports mood symptoms as stable denies any new concerns.  Will continue plan as noted below.  Plan Gender dysphoria Patient will continue psychotherapy sessions with therapist here in clinic. Patient reports being ready for hormone replacement therapy.  Patient will work with providers for the same.  Alcohol use disorder moderate-improving Patient will continue to cut down. Provided substance abuse counseling.  For tobacco use disorder-unstable Patient on Wellbutrin now. Provided smoking cessation counseling.  Follow-up in clinic in 3 months or sooner if needed.  I have spent atleast 15 minutes face to face with patient today. More than 50 % of the time was spent for psychoeducation and supportive psychotherapy and care coordination.  This note was generated in part or whole with voice recognition software. Voice recognition is usually quite accurate but there are transcription errors that can and very often do occur. I apologize for any typographical errors that were not detected and corrected.            Jomarie LongsSaramma Stefannie Defeo, MD 01/29/2018, 5:44 PM

## 2018-02-12 ENCOUNTER — Ambulatory Visit: Payer: Medicaid Other | Admitting: Licensed Clinical Social Worker

## 2018-02-14 ENCOUNTER — Telehealth: Payer: Self-pay

## 2018-02-14 DIAGNOSIS — F332 Major depressive disorder, recurrent severe without psychotic features: Secondary | ICD-10-CM

## 2018-02-14 MED ORDER — BUPROPION HCL ER (SR) 150 MG PO TB12: 150.0000 mg | ORAL_TABLET | Freq: Two times a day (BID) | ORAL | 3 refills | Status: DC

## 2018-02-14 NOTE — Telephone Encounter (Signed)
Sent wellbutrin to pharmacy. 

## 2018-02-14 NOTE — Telephone Encounter (Signed)
pt sister called states that patient needs refills on wellbutrin sr. states that his primary will not refill because her is now seeing psychiatry   buPROPion [facility] SR) 150 MG 12 hr tablet  Medication  Date: 11/29/2017 Department: [facility], [facility] Ordering/Authorizing: Johnna Acosta, NP  Order Providers   Prescribing Provider Encounter Provider  Johnna Acosta, NP Johnna Acosta, NP  Outpatient Medication Detail    Disp Refills Start End   buPROPion [facility] SR) 150 MG 12 hr tablet 60 tablet 1 11/29/2017    Sig: TAKE 1 TABLET BY MOUTH TWICE A DAY   Sent to pharmacy as: buPROPion (WELLBUTRIN SR) 150 MG 12 hr tablet   E-Prescribing Status: Receipt confirmed by pharmacy (11/29/2017 1:05 PM EST)

## 2018-02-27 ENCOUNTER — Ambulatory Visit (INDEPENDENT_AMBULATORY_CARE_PROVIDER_SITE_OTHER): Payer: Medicaid Other | Admitting: Licensed Clinical Social Worker

## 2018-02-27 DIAGNOSIS — F332 Major depressive disorder, recurrent severe without psychotic features: Secondary | ICD-10-CM | POA: Diagnosis not present

## 2018-03-25 ENCOUNTER — Ambulatory Visit (INDEPENDENT_AMBULATORY_CARE_PROVIDER_SITE_OTHER): Payer: Medicaid Other | Admitting: Licensed Clinical Social Worker

## 2018-03-25 ENCOUNTER — Other Ambulatory Visit: Payer: Self-pay

## 2018-03-25 DIAGNOSIS — F332 Major depressive disorder, recurrent severe without psychotic features: Secondary | ICD-10-CM

## 2018-04-08 ENCOUNTER — Ambulatory Visit: Payer: Medicaid Other | Admitting: Adult Health

## 2018-04-08 ENCOUNTER — Encounter: Payer: Self-pay | Admitting: Adult Health

## 2018-04-08 VITALS — BP 122/78 | HR 84 | Resp 16 | Ht 68.0 in | Wt 205.0 lb

## 2018-04-08 DIAGNOSIS — F640 Transsexualism: Secondary | ICD-10-CM

## 2018-04-08 DIAGNOSIS — Z9981 Dependence on supplemental oxygen: Secondary | ICD-10-CM | POA: Diagnosis not present

## 2018-04-08 DIAGNOSIS — K219 Gastro-esophageal reflux disease without esophagitis: Secondary | ICD-10-CM | POA: Diagnosis not present

## 2018-04-08 DIAGNOSIS — J441 Chronic obstructive pulmonary disease with (acute) exacerbation: Secondary | ICD-10-CM

## 2018-04-08 DIAGNOSIS — F1010 Alcohol abuse, uncomplicated: Secondary | ICD-10-CM

## 2018-04-08 DIAGNOSIS — Z789 Other specified health status: Secondary | ICD-10-CM

## 2018-04-08 DIAGNOSIS — F17200 Nicotine dependence, unspecified, uncomplicated: Secondary | ICD-10-CM

## 2018-04-08 NOTE — Progress Notes (Signed)
[facility] [address] [place], [place] [address]  Internal MEDICINE  Office Visit Note  Patient Name: Clifford Burgess  [id number]  [id number]  Date of Service: 04/09/2018  Chief Complaint  Patient presents with  . Gastroesophageal Reflux  . Asthma    HPI Pt is here for follow up on GERD and copd.  Reports good control of his GERD with medications.  She denies any overt issues with his COPD.  She reports that she continues to smoke a pack of cigarettes per day.    Current Medication: Outpatient Encounter Medications as of 04/08/2018  Medication Sig  . buPROPion (WELLBUTRIN SR) 150 MG 12 hr tablet Take 1 tablet (150 mg total) by mouth 2 (two) times daily.  Marland Kitchen omeprazole (PRILOSEC) 20 MG capsule Take 1 capsule (20 mg total) by mouth daily.  . OXYGEN Inhale 2 L into the lungs. At night   No facility-administered encounter medications on file as of 04/08/2018.     Surgical History: Past Surgical History:  Procedure Laterality Date  . HERNIA REPAIR      Medical History: Past Medical History:  Diagnosis Date  . Asthma   . COPD (chronic obstructive pulmonary disease) (HCC)   . GERD (gastroesophageal reflux disease)     Family History: Family History  Problem Relation Age of Onset  . Hypertension Father     Social History   Socioeconomic History  . Marital status: Divorced    Spouse name: Not on file  . Number of children: 2  . Years of education: Not on file  . Highest education level: 8th grade  Occupational History  . Not on file  Social Needs  . Financial resource strain: Hard  . Food insecurity:    Worry: Often true    Inability: Often true  . Transportation needs:    Medical: No    Non-medical: No  Tobacco Use  . Smoking status: Current Every Day Smoker    Packs/day: 1.00    Types: Cigarettes  . Smokeless tobacco: Current User    Types: Chew  Substance and Sexual Activity  . Alcohol use: Yes    Alcohol/week: 2.0 standard  drinks    Types: 2 Cans of beer per week  . Drug use: Not Currently    Types: Cocaine, Marijuana, Heroin, LSD, "Crack" cocaine  . Sexual activity: Not Currently  Lifestyle  . Physical activity:    Days per week: 0 days    Minutes per session: 0 min  . Stress: Not at all  Relationships  . Social connections:    Talks on phone: Not on file    Gets together: Not on file    Attends religious service: Never    Active member of club or organization: No    Attends meetings of clubs or organizations: Never    Relationship status: Divorced  . Intimate partner violence:    Fear of current or ex partner: Not on file    Emotionally abused: No    Physically abused: No    Forced sexual activity: No  Other Topics Concern  . Not on file  Social History Narrative  . Not on file      Review of Systems  Constitutional: Negative for chills, fatigue and unexpected weight change.  HENT: Negative for congestion, rhinorrhea, sneezing and sore throat.   Eyes: Negative for photophobia, pain and redness.  Respiratory: Negative for cough, chest tightness and shortness of breath.   Cardiovascular: Negative for chest pain and  palpitations.  Gastrointestinal: Negative for abdominal pain, constipation, diarrhea, nausea and vomiting.  Endocrine: Negative.   Genitourinary: Negative for dysuria and frequency.  Musculoskeletal: Negative for arthralgias, back pain, joint swelling and neck pain.  Skin: Negative for rash.  Allergic/Immunologic: Negative.   Neurological: Negative for tremors and numbness.  Hematological: Negative for adenopathy. Does not bruise/bleed easily.  Psychiatric/Behavioral: Negative for behavioral problems and sleep disturbance. The patient is not nervous/anxious.     Vital Signs: BP 122/78   Pulse 84   Resp 16   Ht  (1.727 m)   Wt 205 lb (93 kg)   SpO2 92%   BMI 31.17 kg/m    Physical Exam Vitals signs and nursing note reviewed.  Constitutional:      General: He  is not in acute distress.    Appearance: He is well-developed. He is not diaphoretic.  HENT:     Head: Normocephalic and atraumatic.     Mouth/Throat:     Pharynx: No oropharyngeal exudate.  Eyes:     Pupils: Pupils are equal, round, and reactive to light.  Neck:     Musculoskeletal: Normal range of motion and neck supple.     Thyroid: No thyromegaly.     Vascular: No JVD.     Trachea: No tracheal deviation.  Cardiovascular:     Rate and Rhythm: Normal rate and regular rhythm.     Heart sounds: Normal heart sounds. No murmur. No friction rub. No gallop.   Pulmonary:     Effort: Pulmonary effort is normal. No respiratory distress.     Breath sounds: Normal breath sounds. No wheezing or rales.  Chest:     Chest wall: No tenderness.  Abdominal:     Palpations: Abdomen is soft.     Tenderness: There is no abdominal tenderness. There is no guarding.  Musculoskeletal: Normal range of motion.  Lymphadenopathy:     Cervical: No cervical adenopathy.  Skin:    General: Skin is warm and dry.  Neurological:     Mental Status: He is alert and oriented to person, place, and time.     Cranial Nerves: No cranial nerve deficit.  Psychiatric:        Behavior: Behavior normal.        Thought Content: Thought content normal.        Judgment: Judgment normal.    Assessment/Plan: 1. Gastroesophageal reflux disease without esophagitis Stable at this time continue present medication.  2. Dependence on nocturnal oxygen therapy Patient continue to use nocturnal oxygen 2 L/min.  Patient reports good results with this.  3. COPD exacerbation (HCC) COPD stable at this time.  Denies any shortness of breath or coughing currently.  4. Transgender Patient has been doing psychiatry and feels like he is ready for hormone replacement therapy.  Psychiatry has also said that they feel like she can move forward so we will find a endocrinologist to refer her to for initial hormone therapy.  5. Nicotine  dependence with current use Smoking cessation counseling: 1. Pt acknowledges the risks of long term smoking, she will try to quite smoking. 2. Options for different medications including nicotine products, chewing gum, patch etc, Wellbutrin and Chantix is discussed 3. Goal and date of compete cessation is discussed 4. Total time spent in smoking cessation is 15 min.  6. Alcohol abuse Patient has drastically cut down on drinking and reports that has not had a drink in 3 weeks.  She hopes to continue this.  General  Counseling: markie sopp understanding of the findings of todays visit and agrees with plan of treatment. I have discussed any further diagnostic evaluation that may be needed or ordered today. We also reviewed his medications today. he has been encouraged to call the office with any questions or concerns that should arise related to todays visit.    No orders of the defined types were placed in this encounter.   No orders of the defined types were placed in this encounter.   Time spent: 25 Minutes   This patient was seen by Blima Ledger AGNP-C in Collaboration with Dr Lyndon Code as a part of collaborative care agreement     Johnna Acosta AGNP-C Internal medicine

## 2018-04-25 ENCOUNTER — Ambulatory Visit (INDEPENDENT_AMBULATORY_CARE_PROVIDER_SITE_OTHER): Payer: Medicaid Other | Admitting: Licensed Clinical Social Worker

## 2018-04-25 ENCOUNTER — Other Ambulatory Visit: Payer: Self-pay

## 2018-04-25 DIAGNOSIS — F332 Major depressive disorder, recurrent severe without psychotic features: Secondary | ICD-10-CM | POA: Diagnosis not present

## 2018-04-30 ENCOUNTER — Other Ambulatory Visit: Payer: Self-pay

## 2018-04-30 ENCOUNTER — Encounter: Payer: Self-pay | Admitting: Psychiatry

## 2018-04-30 ENCOUNTER — Ambulatory Visit (INDEPENDENT_AMBULATORY_CARE_PROVIDER_SITE_OTHER): Payer: Medicaid Other | Admitting: Psychiatry

## 2018-04-30 DIAGNOSIS — F640 Transsexualism: Secondary | ICD-10-CM

## 2018-04-30 DIAGNOSIS — F17200 Nicotine dependence, unspecified, uncomplicated: Secondary | ICD-10-CM

## 2018-04-30 DIAGNOSIS — F1020 Alcohol dependence, uncomplicated: Secondary | ICD-10-CM

## 2018-04-30 NOTE — Progress Notes (Signed)
Virtual Visit via Telephone Note  I connected with Clifford Burgess on 04/30/18 at 10:00 AM EDT by telephone and verified that I am speaking with the correct person using two identifiers.   I discussed the limitations, risks, security and privacy concerns of performing an evaluation and management service by telephone and the availability of in person appointments. I also discussed with the patient that there may be a patient responsible charge related to this service. The patient expressed understanding and agreed to proceed.   I discussed the assessment and treatment plan with the patient. The patient was provided an opportunity to ask questions and all were answered. The patient agreed with the plan and demonstrated an understanding of the instructions.   The patient was advised to call back or seek an in-person evaluation if the symptoms worsen or if the condition fails to improve as anticipated.   BH MD OP Progress Note  04/30/2018 2:37 PM Clifford Burgess  MRN:  [id number]  Chief Complaint:  Chief Complaint    Follow-up     HPI: Clifford Burgess is a 48 year old transgender male to male, on disability, lives with sister in [place], has a history of alcohol use disorder, mild, tobacco use disorder, gender dysphoria, COPD, GERD, was evaluated by phone today.  Patient today reports she is currently doing well on the current medication regimen.  She denies any side effects to the medication.  Reports she is compliant on the Wellbutrin.  She denies any suicidality, homicidality, significant sadness.  Patient reports that she had acted out in January 2020, had drank some alcohol and overdosed on Wellbutrin.  She regrets her actions and calls that ' stupid."  She reports she does not feel that way anymore.  Has good social support from her sister.  Patient reports she can reach out to her and talk to her if she needs to.  She continues to be in psychotherapy sessions with Ms.  Peacock.  Patient reports she has been trying to cut down on alcohol, last drink was a week ago when she had 2 to 3, 25 ounce beer.  Some time was spent providing substance abuse counseling.  Patient denies any other concerns today. Visit Diagnosis:    ICD-10-CM   1. 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 Alcohol use disorder, moderate, dependence (HCC) F10.20   3. Tobacco use disorder F17.200     Past Psychiatric History: I have reviewed past psychiatric history from my progress note on 11/27/2017.  Past Medical History:  Past Medical History:  Diagnosis Date  . Asthma   . COPD (chronic obstructive pulmonary disease) (HCC)   . GERD (gastroesophageal reflux disease)     Past Surgical History:  Procedure Laterality Date  . HERNIA REPAIR      Family Psychiatric History: Reviewed family psychiatric history from my progress note on 11/27/2017.  Family History:  Family History  Problem Relation Age of Onset  . Hypertension Father     Social History: Reviewed social history from my progress note on 11/27/2017. Social History   Socioeconomic History  . Marital status: Divorced    Spouse name: Not on file  . Number of children: 2  . Years of education: Not on file  . Highest education level: 8th grade  Occupational History  . Not on file  Social Needs  . Financial resource strain: Hard  . Food insecurity:    Worry: Often true    Inability: Often true  . Transportation needs:  Medical: No    Non-medical: No  Tobacco Use  . Smoking status: Current Every Day Smoker    Packs/day: 1.00    Types: Cigarettes  . Smokeless tobacco: Current User    Types: Chew  Substance and Sexual Activity  . Alcohol use: Yes    Alcohol/week: 2.0 standard drinks    Types: 2 Cans of beer per week  . Drug use: Not Currently    Types: Cocaine, Marijuana, Heroin, LSD, "Crack" cocaine  . Sexual activity: Not Currently  Lifestyle  . Physical activity:    Days per week: 0  days    Minutes per session: 0 min  . Stress: Not at all  Relationships  . Social connections:    Talks on phone: Not on file    Gets together: Not on file    Attends religious service: Never    Active member of club or organization: No    Attends meetings of clubs or organizations: Never    Relationship status: Divorced  Other Topics Concern  . Not on file  Social History Narrative  . Not on file    Allergies:  Allergies  Allergen Reactions  . Chocolate     Metabolic Disorder Labs: No results found for: HGBA1C, MPG No results found for: PROLACTIN Lab Results  Component Value Date   CHOL 189 10/15/2017   TRIG 135 10/15/2017   HDL 48 10/15/2017   LDLCALC 114 (H) 10/15/2017   Lab Results  Component Value Date   TSH 1.130 10/15/2017    Therapeutic Level Labs: No results found for: LITHIUM No results found for: VALPROATE No components found for:  CBMZ  Current Medications: Current Outpatient Medications  Medication Sig Dispense Refill  . buPROPion (WELLBUTRIN SR) 150 MG 12 hr tablet Take 1 tablet (150 mg total) by mouth 2 (two) times daily. 60 tablet 3  . omeprazole (PRILOSEC) 20 MG capsule Take 1 capsule (20 mg total) by mouth daily. 30 capsule 3  . OXYGEN Inhale 2 L into the lungs. At night     No current facility-administered medications for this visit.      Musculoskeletal: Strength & Muscle Tone: UTA Gait & Station: UTA Patient leans: N/A  Psychiatric Specialty Exam: Review of Systems  Psychiatric/Behavioral: The patient is not nervous/anxious.   All other systems reviewed and are negative.   There were no vitals taken for this visit.There is no height or weight on file to calculate BMI.  General Appearance: UTA  Eye Contact:  UTA  Speech:  Normal Rate  Volume:  Normal  Mood:  Euthymic  Affect:  UTA  Thought Process:  Goal Directed and Descriptions of Associations: Intact  Orientation:  Full (Time, Place, and Person)  Thought Content: Logical    Suicidal Thoughts:  No  Homicidal Thoughts:  No  Memory:  Immediate;   Fair Recent;   Fair Remote;   Fair  Judgement:  Fair  Insight:  Fair  Psychomotor Activity:  UTA  Concentration:  Concentration: Fair and Attention Span: Fair  Recall:  FiservFair  Fund of Knowledge: Fair  Language: Fair  Akathisia:  No  Handed:  Right  AIMS (if indicated): NA  Assets:  Communication Skills Desire for Improvement Social Support  ADL's:  Intact  Cognition: WNL  Sleep:  Fair   Screenings: AIMS     [facility]  AIMS Total Score  0    AUDIT     Admission (Discharged) from 01/25/2018  in [facility] INPATIENT BEH[facility] MEDICINE Office Visit from 01/07/2018 in [facility], [facility] Office Visit from 10/05/2017 in [facility], [facility]  Alcohol Use Disorder Identification Test Final Score (AUDIT)  PHQ2-9     Office Visit from 04/08/2018 in [facility], [facility] Office Visit from 01/07/2018 in [facility], [facility] Office Visit from 10/05/2017 in [facility], [facility]  PHQ-2 Total Score  0  0  2  PHQ-9 Total Score  -  -  7       Assessment and Plan: Clifford Burgess is a 48 year old Caucasian male to male transgender, divorced, on SSD, has a history of alcohol use disorder, tobacco use disorder, COPD, GERD, was evaluated by phone today.  Patient reports doing well on the current medication regimen.  Patient continues to need support for alcohol use disorder however is better able to cut back.  Plan as noted below.  Plan Gender dysphoria- progressing Patient will continue psychotherapy sessions with therapist here in clinic.  Alcohol use disorder moderate-improving Provided substance abuse counseling.  For tobacco use disorder-unstable Patient is on Wellbutrin. Provided smoking cessation counseling.  Follow-up in clinic in 1 month or sooner if needed.  I have spent atleast 15 minutes non face to  face with patient today. More than 50 % of the time was spent for psychoeducation and supportive psychotherapy and care coordination.  This note was generated in part or whole with voice recognition software. Voice recognition is usually quite accurate but there are transcription errors that can and very often do occur. I apologize for any typographical errors that were not detected and corrected.       Jomarie Longs, MD 04/30/2018, 2:37 PM

## 2018-04-30 NOTE — Progress Notes (Signed)
TC on  04-30-18 @ 8:40 spoke with patient reviewed patient allergies with no changes. Reviewed the medical and surgical hx with no changes.  Pt medications and pharmacy were reviewed with no changes. No vitals taken because this is a phone consult.

## 2018-05-19 NOTE — Progress Notes (Signed)
   THERAPIST PROGRESS NOTE  Session Time:  Participation Level: Active  Behavioral Response: DisheveledAlertAnxious  Type of Therapy: Individual Therapy  Treatment Goals addressed: Coping  Interventions: Solution Focused  Summary: Martie Cavazos is a 48 y.o. adult who presents with resistance.  Patient reports that he wants hormone replacement treatment and he would like Psychiatry to release him.  Discussed expectations of Patient and the overall effects of hormone replacement.  Discussed triggers and stressors and how to reduce frequency.   Suicidal/Homicidal: No  Plan: Return again in 2 weeks.  Diagnosis: Axis I: Depression    Axis II: No diagnosis    Marinda Elk, LCSW 02/27/2018

## 2018-05-20 NOTE — Progress Notes (Signed)
   THERAPIST PROGRESS NOTE  Session Time:  Participation Level: Active  Behavioral Response: DisheveledAlertDepressed  Type of Therapy: Individual Therapy  Treatment Goals addressed: Coping  Interventions: Solution Focused  Summary: Clifford Burgess is a 48 y.o. adult who presents with resistance. Therapist discussed and educated Patient on inadequate pattern of communication with partner, family members, employer leading to frequent arguing and conflicts.      Suicidal/Homicidal: No  Plan: Return again in 2 weeks.  Diagnosis: Axis I: Depression    Axis II: No diagnosis    Marinda Elk, LCSW 03/25/2018

## 2018-05-23 ENCOUNTER — Other Ambulatory Visit: Payer: Self-pay

## 2018-05-23 ENCOUNTER — Ambulatory Visit: Payer: Medicaid Other | Admitting: Licensed Clinical Social Worker

## 2018-05-31 NOTE — Progress Notes (Signed)
See note

## 2018-05-31 NOTE — Progress Notes (Signed)
  Virtual Visit via Video Note  I connected with Clifford Burgess on 04/25/18 at  3:00 PM EDT by a video enabled telemedicine application and verified that I am speaking with the correct person using two identifiers.  Location: Patient: home Provider: office   I discussed the limitations of evaluation and management by telemedicine and the availability of in person appointments. The patient expressed understanding and agreed to proceed.    Participation Level: Minimal  Type of Therapy: Individual Therapy  Treatment Goals addressed: Anxiety  Interventions: CBT and Motivational Interviewing  Summary: Clifford Burgess is a 48 y.o. adult who presents with continued symptoms.  Therapist facilitated the session to address the stressors and assist him with identifying coping skills. Therapist encouraged him to process feelings of stress and frustrations. Therapist explored the sources of the Patient's stressors. Therapist encouraged the Patient to identify a support system that he can rely on to provide support and encouragement. Therapist validated his emotional responses and reflected that he has does have any natural supports. Therapist discussed ways to build natural supports through churches. Therapist provided supportive feedback and empathy he explained that he has been dealing with his mental health for several years. Therapist used psycho education to increase his awareness of his MH diagnosis.    Suicidal/Homicidal: No  Plan: Return again in 2 weeks.  Diagnosis: Axis I: Major Depression, Recurrent severe    Axis II: No diagnosis  I discussed the assessment and treatment plan with the patient. The patient was provided an opportunity to ask questions and all were answered. The patient agreed with the plan and demonstrated an understanding of the instructions.   The patient was advised to call back or seek an in-person evaluation if the symptoms worsen or if the condition  fails to improve as anticipated.  I provided 60 minutes of non-face-to-face time during this encounter.   Marinda Elk, LCSW

## 2018-06-04 ENCOUNTER — Other Ambulatory Visit: Payer: Self-pay

## 2018-06-04 ENCOUNTER — Encounter: Payer: Self-pay | Admitting: Psychiatry

## 2018-06-04 ENCOUNTER — Ambulatory Visit (INDEPENDENT_AMBULATORY_CARE_PROVIDER_SITE_OTHER): Payer: Medicaid Other | Admitting: Psychiatry

## 2018-06-04 DIAGNOSIS — F3341 Major depressive disorder, recurrent, in partial remission: Secondary | ICD-10-CM | POA: Diagnosis not present

## 2018-06-04 DIAGNOSIS — F640 Transsexualism: Secondary | ICD-10-CM | POA: Diagnosis not present

## 2018-06-04 DIAGNOSIS — F1020 Alcohol dependence, uncomplicated: Secondary | ICD-10-CM

## 2018-06-04 DIAGNOSIS — F17200 Nicotine dependence, unspecified, uncomplicated: Secondary | ICD-10-CM

## 2018-06-04 MED ORDER — BUPROPION HCL ER (SR) 150 MG PO TB12: 150.0000 mg | ORAL_TABLET | Freq: Two times a day (BID) | ORAL | 3 refills | Status: DC

## 2018-06-04 MED ORDER — VARENICLINE TARTRATE 0.5 MG PO TABS: 0.5000 mg | ORAL_TABLET | Freq: Two times a day (BID) | ORAL | 1 refills | Status: DC

## 2018-06-04 NOTE — Progress Notes (Signed)
Virtual Visit via Video Note  I connected with Clifford Burgess on 06/04/18 at  3:00 PM EDT by a video enabled telemedicine application and verified that I am speaking with the correct person using two identifiers.   I discussed the limitations of evaluation and management by telemedicine and the availability of in person appointments. The patient expressed understanding and agreed to proceed.    I discussed the assessment and treatment plan with the patient. The patient was provided an opportunity to ask questions and all were answered. The patient agreed with the plan and demonstrated an understanding of the instructions.   The patient was advised to call back or seek an in-person evaluation if the symptoms worsen or if the condition fails to improve as anticipated.   BH MD OP Progress Note  06/04/2018 5:45 PM Clifford Burgess  MRN:  [id number]  Chief Complaint:  Chief Complaint    Follow-up     HPI: Clifford Burgess is a 48 year old transgender male to male, on disability, lives with sister in [place], has a history of gender dysphoria, MDD, alcohol use disorder, tobacco use disorder, COPD, GERD was evaluated by telemedicine today.  Patient today reports she is currently improving on the current medication.  She denies any side effects.  She denies any significant sadness, crying spells.  She reports sleep is good.  She denies any suicidality or homicidality.  She reports she has been cutting back on alcohol and the last drink was 2 weeks ago.  She continues to need support to cut back on smoking cigarettes.  Clifford Burgess has not been very helpful.  Clifford Burgess was initially started for tobacco use however was continued also for her depressive symptoms.  Discussed with her to continue the Clifford Burgess for now.  We will start Chantix.  She agrees with plan.  Discussed with patient to continue psychotherapy sessions.  Discussed with patient to make use of her support system.  She does  have good support system from her sister. Visit Diagnosis:    ICD-10-CM   1. 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 MDD (major depressive disorder), recurrent, in partial remission (HCC) F33.41 buPROPion (Clifford Burgess SR) 150 MG 12 hr tablet  3. Alcohol use disorder, moderate, dependence (HCC) F10.20   4. Tobacco use disorder F17.200 buPROPion (Clifford Burgess SR) 150 MG 12 hr tablet    varenicline (CHANTIX) 0.5 MG tablet    Past Psychiatric History: Reviewed past psychiatric history from my progress note on 11/27/2017  Past Medical History:  Past Medical History:  Diagnosis Date  . Asthma   . COPD (chronic obstructive pulmonary disease) (HCC)   . GERD (gastroesophageal reflux disease)     Past Surgical History:  Procedure Laterality Date  . HERNIA REPAIR      Family Psychiatric History: Reviewed family psychiatric history from my progress note on 11/27/2017.  Family History:  Family History  Problem Relation Age of Onset  . Hypertension Father     Social History: Reviewed social history from my progress note on 11/27/2017 Social History   Socioeconomic History  . Marital status: Divorced    Spouse name: Not on file  . Number of children: 2  . Years of education: Not on file  . Highest education level: 8th grade  Occupational History  . Not on file  Social Needs  . Financial resource strain: Hard  . Food insecurity:    Worry: Often true    Inability: Often true  . Transportation needs:  Medical: No    Non-medical: No  Tobacco Use  . Smoking status: Current Every Day Smoker    Packs/day: 1.00    Types: Cigarettes  . Smokeless tobacco: Current User    Types: Chew  Substance and Sexual Activity  . Alcohol use: Yes    Alcohol/week: 2.0 standard drinks    Types: 2 Cans of beer per week  . Drug use: Not Currently    Types: Cocaine, Marijuana, Heroin, LSD, "Crack" cocaine  . Sexual activity: Not Currently  Lifestyle  . Physical activity:    Days per  week: 0 days    Minutes per session: 0 min  . Stress: Not at all  Relationships  . Social connections:    Talks on phone: Not on file    Gets together: Not on file    Attends religious service: Never    Active member of club or organization: No    Attends meetings of clubs or organizations: Never    Relationship status: Divorced  Other Topics Concern  . Not on file  Social History Narrative  . Not on file    Allergies:  Allergies  Allergen Reactions  . Chocolate     Metabolic Disorder Labs: No results found for: HGBA1C, MPG No results found for: PROLACTIN Lab Results  Component Value Date   CHOL 189 10/15/2017   TRIG 135 10/15/2017   HDL 48 10/15/2017   LDLCALC 114 (H) 10/15/2017   Lab Results  Component Value Date   TSH 1.130 10/15/2017    Therapeutic Level Labs: No results found for: LITHIUM No results found for: VALPROATE No components found for:  CBMZ  Current Medications: Current Outpatient Medications  Medication Sig Dispense Refill  . buPROPion (Clifford Burgess SR) 150 MG 12 hr tablet Take 1 tablet (150 mg total) by mouth 2 (two) times daily. 60 tablet 3  . omeprazole (PRILOSEC) 20 MG capsule Take 1 capsule (20 mg total) by mouth daily. 30 capsule 3  . OXYGEN Inhale 2 L into the lungs. At night    . varenicline (CHANTIX) 0.5 MG tablet Take 1 tablet (0.5 mg total) by mouth 2 (two) times daily. Start take one tablet a day for a week and increase to twice a day 60 tablet 1   No current facility-administered medications for this visit.      Musculoskeletal: Strength & Muscle Tone: within normal limits Gait & Station: normal Patient leans: N/A  Psychiatric Specialty Exam: Review of Systems  Psychiatric/Behavioral: The patient is not nervous/anxious.   All other systems reviewed and are negative.   There were no vitals taken for this visit.There is no height or weight on file to calculate BMI.  General Appearance: Casual  Eye Contact:  Good  Speech:   Normal Rate  Volume:  Normal  Mood:  Euthymic  Affect:  Congruent  Thought Process:  Goal Directed and Descriptions of Associations: Intact  Orientation:  Full (Time, Place, and Person)  Thought Content: Logical   Suicidal Thoughts:  No  Homicidal Thoughts:  No  Memory:  Immediate;   Fair Recent;   Fair Remote;   Fair  Judgement:  Fair  Insight:  Good  Psychomotor Activity:  Normal  Concentration:  Concentration: Fair and Attention Span: Fair  Recall:  [employer] of Knowledge: Fair  Language: Fair  Akathisia:  No  Handed:  Right  AIMS (if indicated): Denies tremors, rigidity, stiffness  Assets:  Communication Skills Desire for Improvement Social Support  ADL's:  Intact  Cognition: WNL  Sleep:  Fair   Screenings: AIMS     Ad[facility]  AIMS Total Score  0    AUDIT     [facility]Dis[facility]  Alcohol Use Disorder Identification Test Final Score (AUDIT)  3  6  31     PHQ2-9     [facility]  PHQ-2 Total Score  0  0  2  PHQ-9 Total Score  -  -  7       Assessment and Plan: Clifford FillersLiza is a 48 year old Caucasian male to male transgender, divorced, on SSD, has a history of alcohol use disorder, tobacco use disorder, COPD, GERD was evaluated by telemedicine today.  Patient denies any significant mood symptoms.  Patient however continues to struggle with tobacco use disorder and is interested in Chantix.  Discussed plan as noted below.  Plan Gender dysphoria- progressing Patient will continue psychotherapy session with therapist here in clinic.  For MDD-in partial  remission Clifford Burgess as prescribed  For alcohol use disorder moderate-in early remission Last drink was 2 weeks ago.  We will continue to monitor closely.  For  tobacco use disorder--unstable Provided smoking cessation counseling. Start Chantix 0.5 mg p.o. twice daily.  Follow-up in clinic in 1 month or sooner if needed.  June 30 at 2:30 PM  I have spent atleast 15 minutes non face to face with patient today. More than 50 % of the time was spent for psychoeducation and supportive psychotherapy and care coordination.  This note was generated in part or whole with voice recognition software. Voice recognition is usually quite accurate but there are transcription errors that can and very often do occur. I apologize for any typographical errors that were not detected and corrected.        Jomarie LongsSaramma Ansar Skoda, MD 06/04/2018, 5:45 PM

## 2018-06-21 ENCOUNTER — Other Ambulatory Visit: Payer: Self-pay | Admitting: Adult Health

## 2018-07-09 ENCOUNTER — Other Ambulatory Visit: Payer: Self-pay

## 2018-07-09 ENCOUNTER — Encounter: Payer: Self-pay | Admitting: Psychiatry

## 2018-07-09 ENCOUNTER — Ambulatory Visit (INDEPENDENT_AMBULATORY_CARE_PROVIDER_SITE_OTHER): Payer: Medicaid Other | Admitting: Psychiatry

## 2018-07-09 DIAGNOSIS — F3341 Major depressive disorder, recurrent, in partial remission: Secondary | ICD-10-CM | POA: Diagnosis not present

## 2018-07-09 DIAGNOSIS — F17200 Nicotine dependence, unspecified, uncomplicated: Secondary | ICD-10-CM | POA: Insufficient documentation

## 2018-07-09 DIAGNOSIS — F1020 Alcohol dependence, uncomplicated: Secondary | ICD-10-CM | POA: Diagnosis not present

## 2018-07-09 DIAGNOSIS — F640 Transsexualism: Secondary | ICD-10-CM | POA: Diagnosis not present

## 2018-07-09 NOTE — Patient Instructions (Signed)
Coping with Quitting Smoking  Quitting smoking is a physical and mental challenge. You will face cravings, withdrawal symptoms, and temptation. Before quitting, work with your health care provider to make a plan that can help you cope. Preparation can help you quit and keep you from giving in. How can I cope with cravings? Cravings usually last for 5-10 minutes. If you get through it, the craving will pass. Consider taking the following actions to help you cope with cravings:  Keep your mouth busy: ? Chew sugar-free gum. ? Suck on hard candies or a straw. ? Brush your teeth.  Keep your hands and body busy: ? Immediately change to a different activity when you feel a craving. ? Squeeze or play with a ball. ? Do an activity or a hobby, like making bead jewelry, practicing needlepoint, or working with wood. ? Mix up your normal routine. ? Take a short exercise break. Go for a quick walk or run up and down stairs. ? Spend time in public places where smoking is not allowed.  Focus on doing something kind or helpful for someone else.  Call a friend or family member to talk during a craving.  Join a support group.  Call a quit line, such as 1-800-QUIT-NOW.  Talk with your health care provider about medicines that might help you cope with cravings and make quitting easier for you. How can I deal with withdrawal symptoms? Your body may experience negative effects as it tries to get used to not having nicotine in the system. These effects are called withdrawal symptoms. They may include:  Feeling hungrier than normal.  Trouble concentrating.  Irritability.  Trouble sleeping.  Feeling depressed.  Restlessness and agitation.  Craving a cigarette. To manage withdrawal symptoms:  Avoid places, people, and activities that trigger your cravings.  Remember why you want to quit.  Get plenty of sleep.  Avoid coffee and other caffeinated drinks. These may worsen some of your symptoms.  How can I handle social situations? Social situations can be difficult when you are quitting smoking, especially in the first few weeks. To manage this, you can:  Avoid parties, bars, and other social situations where people might be smoking.  Avoid alcohol.  Leave right away if you have the urge to smoke.  Explain to your family and friends that you are quitting smoking. Ask for understanding and support.  Plan activities with friends or family where smoking is not an option. What are some ways I can cope with stress? Wanting to smoke may cause stress, and stress can make you want to smoke. Find ways to manage your stress. Relaxation techniques can help. For example:  Breathe slowly and deeply, in through your nose and out through your mouth.  Listen to soothing, relaxing music.  Talk with a family member or friend about your stress.  Light a candle.  Soak in a bath or take a shower.  Think about a peaceful place. What are some ways I can prevent weight gain? Be aware that many people gain weight after they quit smoking. However, not everyone does. To keep from gaining weight, have a plan in place before you quit and stick to the plan after you quit. Your plan should include:  Having healthy snacks. When you have a craving, it may help to: ? Eat plain popcorn, crunchy carrots, celery, or other cut vegetables. ? Chew sugar-free gum.  Changing how you eat: ? Eat small portion sizes at meals. ? Eat 4-6 small meals   throughout the day instead of 1-2 large meals a day. ? Be mindful when you eat. Do not watch television or do other things that might distract you as you eat.  Exercising regularly: ? Make time to exercise each day. If you do not have time for a long workout, do short bouts of exercise for 5-10 minutes several times a day. ? Do some form of strengthening exercise, like weight lifting, and some form of aerobic exercise, like running or swimming.  Drinking plenty of  water or other low-calorie or no-calorie drinks. Drink 6-8 glasses of water daily, or as much as instructed by your health care provider. Summary  Quitting smoking is a physical and mental challenge. You will face cravings, withdrawal symptoms, and temptation to smoke again. Preparation can help you as you go through these challenges.  You can cope with cravings by keeping your mouth busy (such as by chewing gum), keeping your body and hands busy, and making calls to family, friends, or a helpline for people who want to quit smoking.  You can cope with withdrawal symptoms by avoiding places where people smoke, avoiding drinks with caffeine, and getting plenty of rest.  Ask your health care provider about the different ways to prevent weight gain, avoid stress, and handle social situations. This information is not intended to replace advice given to you by your health care provider. Make sure you discuss any questions you have with your health care provider. Document Released: 12/24/2015 Document Revised: 12/08/2016 Document Reviewed: 12/24/2015 Elsevier Patient Education  2020 Elsevier Inc.  

## 2018-07-09 NOTE — Progress Notes (Signed)
Virtual Visit via Video Note  I connected with Clifford Burgess on 07/09/18 at  2:30 PM EDT by a video enabled telemedicine application and verified that I am speaking with the correct person using two identifiers.   I discussed the limitations of evaluation and management by telemedicine and the availability of in person appointments. The patient expressed understanding and agreed to proceed.    I discussed the assessment and treatment plan with the patient. The patient was provided an opportunity to ask questions and all were answered. The patient agreed with the plan and demonstrated an understanding of the instructions.   The patient was advised to call back or seek an in-person evaluation if the symptoms worsen or if the condition fails to improve as anticipated.   BH MD OP Progress Note  07/09/2018 5:06 PM Clifford Burgess  MRN:  [id number]  Chief Complaint:  Chief Complaint    Follow-up     HPI: Clifford Burgess is a 48 year old transgender male to male, on disability, lives with sister in [place], has a history of gender dysphoria, MDD, alcohol use disorder, tobacco use disorder, COPD, GERD was evaluated by telemedicine today.  Patient today reports she is currently doing well with regards to her mood symptoms.  Denies any significant sadness or anxiety symptoms.  Reports sleep is good.  She denies any suicidality, homicidality or perceptual disturbances.  Patient reports she continues to stay away from alcohol.  She currently uses it only socially.  Patient continues to cut back on smoking cigarettes.  Patient reports Chantix was not approved and it caused too much.  Patient continues to take Wellbutrin.  Also provided resources for smoking cessation-1 800 quit now.  Patient advised to continue psychotherapy sessions with Ms. Peacock.   Visit Diagnosis:    ICD-10-CM   1. 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 MDD (major depressive disorder), recurrent, in  partial remission (HCC)  F33.41   3. Alcohol use disorder, moderate, dependence (HCC)  F10.20   4. Tobacco use disorder  F17.200     Past Psychiatric History: I have reviewed past psychiatric history from my progress note on 11/27/2017.  Past Medical History:  Past Medical History:  Diagnosis Date  . Asthma   . COPD (chronic obstructive pulmonary disease) (HCC)   . GERD (gastroesophageal reflux disease)     Past Surgical History:  Procedure Laterality Date  . HERNIA REPAIR      Family Psychiatric History: I have reviewed family psychiatric history from my progress note on 11/27/2017.  Family History:  Family History  Problem Relation Age of Onset  . Hypertension Father     Social History: I have reviewed social history from my progress note on 11/27/2017. Social History   Socioeconomic History  . Marital status: Divorced    Spouse name: Not on file  . Number of children: 2  . Years of education: Not on file  . Highest education level: 8th grade  Occupational History  . Not on file  Social Needs  . Financial resource strain: Hard  . Food insecurity    Worry: Often true    Inability: Often true  . Transportation needs    Medical: No    Non-medical: No  Tobacco Use  . Smoking status: Current Every Day Smoker    Packs/day: 1.00    Types: Cigarettes  . Smokeless tobacco: Current User    Types: Chew  Substance and Sexual Activity  . Alcohol use: Yes  Alcohol/week: 2.0 standard drinks    Types: 2 Cans of beer per week  . Drug use: Not Currently    Types: Cocaine, Marijuana, Heroin, LSD, "Crack" cocaine  . Sexual activity: Not Currently  Lifestyle  . Physical activity    Days per week: 0 days    Minutes per session: 0 min  . Stress: Not at all  Relationships  . Social Musicianconnections    Talks on phone: Not on file    Gets together: Not on file    Attends religious service: Never    Active member of club or organization: No    Attends meetings of clubs or  organizations: Never    Relationship status: Divorced  Other Topics Concern  . Not on file  Social History Narrative  . Not on file    Allergies:  Allergies  Allergen Reactions  . Chocolate     Metabolic Disorder Labs: No results found for: HGBA1C, MPG No results found for: PROLACTIN Lab Results  Component Value Date   CHOL 189 10/15/2017   TRIG 135 10/15/2017   HDL 48 10/15/2017   LDLCALC 114 (H) 10/15/2017   Lab Results  Component Value Date   TSH 1.130 10/15/2017    Therapeutic Level Labs: No results found for: LITHIUM No results found for: VALPROATE No components found for:  CBMZ  Current Medications: Current Outpatient Medications  Medication Sig Dispense Refill  . buPROPion (WELLBUTRIN SR) 150 MG 12 hr tablet Take 1 tablet (150 mg total) by mouth 2 (two) times daily. 60 tablet 3  . omeprazole (PRILOSEC) 20 MG capsule TAKE 1 CAPSULE BY MOUTH EVERY DAY 30 capsule 3  . OXYGEN Inhale 2 L into the lungs. At night     No current facility-administered medications for this visit.      Musculoskeletal: Strength & Muscle Tone: within normal limits Gait & Station: normal Patient leans: N/A  Psychiatric Specialty Exam: Review of Systems  Psychiatric/Behavioral: The patient is not nervous/anxious.   All other systems reviewed and are negative.   There were no vitals taken for this visit.There is no height or weight on file to calculate BMI.  General Appearance: Casual  Eye Contact:  Fair  Speech:  Clear and Coherent  Volume:  Normal  Mood:  Euthymic  Affect:  Appropriate  Thought Process:  Goal Directed and Descriptions of Associations: Intact  Orientation:  Full (Time, Place, and Person)  Thought Content: Logical   Suicidal Thoughts:  No  Homicidal Thoughts:  No  Memory:  Immediate;   Fair Recent;   Fair Remote;   Fair  Judgement:  Fair  Insight:  Fair  Psychomotor Activity:  Normal  Concentration:  Concentration: Fair and Attention Span: Fair   Recall:  [employer]  Fund of Knowledge: Fair  Language: Fair  Akathisia:  No  Handed:  Right  AIMS (if indicated): denies tremors, rigidity  Assets:  Communication Skills Desire for Improvement Social Support  ADL's:  Intact  Cognition: WNL  Sleep:  Fair   Screenings: AIMS     Ad[facility]  AIMS Total Score  0    AUDIT     Ad[facility]Dis[facility]  Alcohol Use Disorder Identification Test Final Score (AUDIT)  3  6  31     PHQ2-9     [facility] Office Visit from 01/07/2018 in [facility] Office Visit from 10/05/2017 in [facility], [facility]  PHQ-2 Total Score  0  0  2  PHQ-9 Total Score  -  -  7       Assessment and Plan: Learta Codding is a 48 year old Caucasian male to male transgender, divorced, on SSD, has a history of depression in remission, tobacco use disorder, alcohol use disorder in early remission, COPD, GERD was evaluated by telemedicine today.  Patient continues to make progress with regards to mood symptoms.  Plan as noted below.  Plan For gender dysphoria-improving Patient to continue psychotherapy sessions with therapist here in clinic  For MDD in partial remission Wellbutrin SR 150 mg p.o. twice daily  For alcohol use disorder in early remission Patient continues to stay sober.  For tobacco use disorder-unstable Provided smoking cessation counseling, provided resources. Patient is already on Wellbutrin which helps Discussed to continue CBT as well as nicotine replacement over-the-counter.  Follow-up in clinic in 1 to 2 months or sooner if needed.  September 23 at 10 AM.  Discussed with patient to reach out to her therapist to make an appointment and to have  more frequent therapy sessions in the meantime.  I have spent atleast 15 minutes non face to face with patient today. More than 50 % of the time was spent for psychoeducation and supportive psychotherapy and care coordination.  This note was generated in part or whole with voice recognition software. Voice recognition is usually quite accurate but there are transcription errors that can and very often do occur. I apologize for any typographical errors that were not detected and corrected.        Ursula Alert, MD 07/09/2018, 5:06 PM

## 2018-07-22 ENCOUNTER — Encounter: Payer: Self-pay | Admitting: Internal Medicine

## 2018-07-22 ENCOUNTER — Ambulatory Visit: Payer: Medicaid Other | Admitting: Internal Medicine

## 2018-07-22 ENCOUNTER — Other Ambulatory Visit: Payer: Self-pay

## 2018-07-22 VITALS — BP 124/82 | HR 80 | Resp 16 | Ht 68.0 in | Wt 204.0 lb

## 2018-07-22 DIAGNOSIS — G4734 Idiopathic sleep related nonobstructive alveolar hypoventilation: Secondary | ICD-10-CM

## 2018-07-22 DIAGNOSIS — Z9981 Dependence on supplemental oxygen: Secondary | ICD-10-CM | POA: Diagnosis not present

## 2018-07-22 DIAGNOSIS — J449 Chronic obstructive pulmonary disease, unspecified: Secondary | ICD-10-CM

## 2018-07-22 DIAGNOSIS — K219 Gastro-esophageal reflux disease without esophagitis: Secondary | ICD-10-CM

## 2018-07-22 DIAGNOSIS — R0602 Shortness of breath: Secondary | ICD-10-CM | POA: Diagnosis not present

## 2018-07-22 NOTE — Progress Notes (Signed)
[facility] [address] [place], [place] [address]  Pulmonary Sleep Medicine   Office Visit Note  Patient Name: Clifford Burgess DOB: [birth year] MRN [id number]  Date of Service: 07/22/2018  Complaints/HPI: Pt is here for follow up.  Pt has a history of GERD and copd.  She does continue to smoke about 1 PPD.  Denies any chest pain, sob or other issues.     ROS  General: (-) fever, (-) chills, (-) night sweats, (-) weakness Skin: (-) rashes, (-) itching,. Eyes: (-) visual changes, (-) redness, (-) itching. Nose and Sinuses: (-) nasal stuffiness or itchiness, (-) postnasal drip, (-) nosebleeds, (-) sinus trouble. Mouth and Throat: (-) sore throat, (-) hoarseness. Neck: (-) swollen glands, (-) enlarged thyroid, (-) neck pain. Respiratory: - cough, (-) bloody sputum, - shortness of breath, - wheezing. Cardiovascular: - ankle swelling, (-) chest pain. Lymphatic: (-) lymph node enlargement. Neurologic: (-) numbness, (-) tingling. Psychiatric: (-) anxiety, (-) depression   Current Medication: Outpatient Encounter Medications as of 07/22/2018  Medication Sig  . buPROPion (WELLBUTRIN SR) 150 MG 12 hr tablet Take 1 tablet (150 mg total) by mouth 2 (two) times daily.  Marland Kitchen. omeprazole (PRILOSEC) 20 MG capsule TAKE 1 CAPSULE BY MOUTH EVERY DAY  . OXYGEN Inhale 2 L into the lungs. At night   No facility-administered encounter medications on file as of 07/22/2018.     Surgical History: Past Surgical History:  Procedure Laterality Date  . HERNIA REPAIR      Medical History: Past Medical History:  Diagnosis Date  . Asthma   . COPD (chronic obstructive pulmonary disease) (HCC)   . GERD (gastroesophageal reflux disease)     Family History: Family History  Problem Relation Age of Onset  . Hypertension Father     Social History: Social History   Socioeconomic History  . Marital status: Divorced    Spouse name: Not on file  . Number of children: 2  . Years of  education: Not on file  . Highest education level: 8th grade  Occupational History  . Not on file  Social Needs  . Financial resource strain: Hard  . Food insecurity    Worry: Often true    Inability: Often true  . Transportation needs    Medical: No    Non-medical: No  Tobacco Use  . Smoking status: Current Every Day Smoker    Packs/day: 1.00    Types: Cigarettes  . Smokeless tobacco: Current User    Types: Chew  Substance and Sexual Activity  . Alcohol use: Yes    Alcohol/week: 2.0 standard drinks    Types: 2 Cans of beer per week  . Drug use: Not Currently    Types: Cocaine, Marijuana, Heroin, LSD, "Crack" cocaine  . Sexual activity: Not Currently  Lifestyle  . Physical activity    Days per week: 0 days    Minutes per session: 0 min  . Stress: Not at all  Relationships  . Social Musicianconnections    Talks on phone: Not on file    Gets together: Not on file    Attends religious service: Never    Active member of club or organization: No    Attends meetings of clubs or organizations: Never    Relationship status: Divorced  . Intimate partner violence    Fear of current or ex partner: Not on file    Emotionally abused: No    Physically abused: No    Forced sexual activity: No  Other Topics Concern  . Not on file  Social History Narrative  . Not on file    Vital Signs: Blood pressure 124/82, pulse 80, resp. rate 16, height 5\' 8"  (1.727 m), weight 204 lb (92.5 kg), SpO2 97 %.  Examination: General Appearance: The patient is well-developed, well-nourished, and in no distress. Skin: Gross inspection of skin unremarkable. Head: normocephalic, no gross deformities. Eyes: no gross deformities noted. ENT: ears appear grossly normal no exudates. Neck: Supple. No thyromegaly. No LAD. Respiratory: clear bilateraly. Cardiovascular: Normal S1 and S2 without murmur or rub. Extremities: No cyanosis. pulses are equal. Neurologic: Alert and oriented. No involuntary  movements.  LABS: No results found for this or any previous visit (from the past 2160 hour(s)).  Radiology: No results found.  No results found.  No results found.    Assessment and Plan: Patient Active Problem List   Diagnosis Date Noted  . 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 MDD (major depressive disorder), recurrent, in partial remission ([place]) 07/09/2018  . Alcohol use disorder, moderate, dependence ([place]) 07/09/2018  . Tobacco use disorder 07/09/2018  . Bipolar 1 disorder, depressed, severe ([place]) 01/25/2018  . Drug overdose 01/23/2018  . COPD exacerbation ([place]) 11/27/2017  . GERD (gastroesophageal reflux disease) 11/27/2017  . 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 Smoker 10/15/2017  . SOB (shortness of breath) 10/15/2017  . Alcohol abuse 10/05/2017  . Drug abuse ([place]) 10/05/2017    1. Chronic obstructive pulmonary disease, unspecified COPD type ([place]) Stable, pt does not use any inhalers at this time. Denies doe.  Continue present management.   2. Gastroesophageal reflux disease without esophagitis Stable, continue present management.   3. Oxygen dependent Continued need for oxygen at night.    4. Nocturnal hypoxia Continue to use oxygen at night on 2lpm.   5. SOB (shortness of breath) Pt did not desat.  - 6 minute walk  General Counseling: I have discussed the findings of the evaluation and examination with Clifford Burgess.  I have also discussed any further diagnostic evaluation thatmay be needed or ordered today. Clifford Burgess verbalizes understanding of the findings of todays visit. We also reviewed his medications today and discussed drug interactions and side effects including but not limited excessive drowsiness and altered mental states. We also discussed that there is always a risk not just to him but also people around him. he has been encouraged to call the office with any questions or concerns that should arise related to todays  visit.    Time spent: 15 This patient was seen by Orson Gear AGNP-C in Collaboration with Dr. Devona Konig as a part of collaborative care agreement.   I have personally obtained a history, examined the patient, evaluated laboratory and imaging results, formulated the assessment and plan and placed orders.    Allyne Gee, MD [facility] Pulmonary and Critical Care Sleep medicine

## 2018-07-22 NOTE — Patient Instructions (Signed)
Hypoxia Hypoxia is a condition that happens when there is a lack of oxygen in the body's tissues and organs. When there is not enough oxygen, organs cannot work as they should. This causes serious problems throughout the body and in the brain. What are the causes? This condition may be caused by:  Exposure to high altitude.  A collapsed lung (pneumothorax).  Lung infection (pneumonia).  Lung injury.  Long-term (chronic) lung disease, such as COPD (chronic obstructive pulmonary disease).  Blood collecting in the chest cavity (hemothorax).  Food, saliva, or vomit getting into the airway (aspiration).  Reduced blood flow (ischemia).  Severe blood loss.  Slow or shallow breathing (hypoventilation).  Blood disorders, such as anemia.  Carbon monoxide poisoning.  The heart suddenly stopping (cardiac arrest).  Anesthetic medicines.  Drowning.  Choking. What are the signs or symptoms? Symptoms of this condition include:  Headache.  Fatigue.  Drowsiness.  Forgetfulness.  Nausea.  Confusion.  Shortness of breath.  Dizziness.  Bluish color of the skin, lips, or nail beds (cyanosis).  Change in consciousness or awareness. If hypoxia is not treated, it can lead to convulsions, loss of consciousness (coma), or brain damage. How is this diagnosed? This condition may be diagnosed based on:  A physical exam.  Blood tests.  A test that measures how much oxygen is in your blood (pulse oximetry). This is done with a sensor that is placed on your finger, toe, or earlobe.  Chest X-ray.  Tests to check your lung function (pulmonary function tests).  A test to check the electrical activity of your heart (electrocardiogram, ECG). You may have other tests to determine the cause of your hypoxia. How is this treated?  Treatment for this condition depends on what is causing the hypoxia. You will likely be treated with oxygen therapy. This may be done by giving you oxygen  through a face mask or through tubes in your nose. Your health care provider may also recommend other therapies to treat the underlying cause of your hypoxia. Follow these instructions at home:  Take over-the-counter and prescription medicines only as told by your health care provider.  Do not use any products that contain nicotine or tobacco, such as cigarettes and e-cigarettes. If you need help quitting, ask your health care provider.  Avoid secondhand smoke.  Work with your health care provider to manage any chronic conditions you have that may be causing hypoxia, such as COPD.  Keep all follow-up visits as told by your health care provider. This is important. Contact a health care provider if:  You have a fever.  You have trouble breathing, even after treatment.  You become extremely short of breath when you exercise. Get help right away if:  Your shortness of breath gets worse, especially with normal or very little activity.  Your skin, lips, or nail beds have a bluish color.  You become confused or you cannot think properly.  You have chest pain. Summary  Hypoxia is a condition that happens when there is a lack of oxygen in the body's tissues and organs.  If hypoxia is not treated, it can lead to convulsions, loss of consciousness (coma), or brain damage.  Symptoms of hypoxia can include a headache, shortness of breath, confusion, nausea, and a bluish skin color.  Hypoxia has many possible causes, including exposure to high altitude, carbon monoxide poisoning, or other health issues, such as blood disorders or cardiac arrest.  Hypoxia is usually treated with oxygen therapy. This information is not   intended to replace advice given to you by your health care provider. Make sure you discuss any questions you have with your health care provider. Document Released: 02/14/2016 Document Revised: 12/08/2016 Document Reviewed: 02/14/2016 Elsevier Patient Education  2020  Elsevier Inc.  

## 2018-07-29 ENCOUNTER — Encounter: Payer: Self-pay | Admitting: Adult Health

## 2018-07-29 ENCOUNTER — Other Ambulatory Visit: Payer: Self-pay

## 2018-07-29 ENCOUNTER — Ambulatory Visit: Payer: Medicaid Other | Admitting: Adult Health

## 2018-07-29 VITALS — BP 114/72 | HR 88 | Resp 16 | Ht 68.0 in | Wt 206.0 lb

## 2018-07-29 DIAGNOSIS — K219 Gastro-esophageal reflux disease without esophagitis: Secondary | ICD-10-CM

## 2018-07-29 DIAGNOSIS — F17200 Nicotine dependence, unspecified, uncomplicated: Secondary | ICD-10-CM

## 2018-07-29 DIAGNOSIS — J449 Chronic obstructive pulmonary disease, unspecified: Secondary | ICD-10-CM

## 2018-07-29 DIAGNOSIS — Z9981 Dependence on supplemental oxygen: Secondary | ICD-10-CM

## 2018-07-29 DIAGNOSIS — Z789 Other specified health status: Secondary | ICD-10-CM

## 2018-07-29 DIAGNOSIS — F640 Transsexualism: Secondary | ICD-10-CM | POA: Diagnosis not present

## 2018-07-29 NOTE — Progress Notes (Signed)
[facility] [address] [place], [place] [address]  Internal MEDICINE  Office Visit Note  Patient Name: Clifford Burgess  [id number]  [id number]  Date of Service: 07/29/2018  Chief Complaint  Patient presents with  . Medical Management of Chronic Issues  . Gastroesophageal Reflux    HPI  Pt is here for follow up.  Overall she is doing well.  She denies any current need.  She continues to use oxygen at night.  Unfortunately she continues to smoke.  She is attempting to cut back and wean herself down. She reports her GERD is well controlled currently.    Current Medication: Outpatient Encounter Medications as of 07/29/2018  Medication Sig  . buPROPion (WELLBUTRIN SR) 150 MG 12 hr tablet Take 1 tablet (150 mg total) by mouth 2 (two) times daily.  Marland Kitchen. omeprazole (PRILOSEC) 20 MG capsule TAKE 1 CAPSULE BY MOUTH EVERY DAY  . OXYGEN Inhale 2 L into the lungs. At night   No facility-administered encounter medications on file as of 07/29/2018.     Surgical History: Past Surgical History:  Procedure Laterality Date  . HERNIA REPAIR      Medical History: Past Medical History:  Diagnosis Date  . Asthma   . COPD (chronic obstructive pulmonary disease) (HCC)   . GERD (gastroesophageal reflux disease)     Family History: Family History  Problem Relation Age of Onset  . Hypertension Father     Social History   Socioeconomic History  . Marital status: Divorced    Spouse name: Not on file  . Number of children: 2  . Years of education: Not on file  . Highest education level: 8th grade  Occupational History  . Not on file  Social Needs  . Financial resource strain: Hard  . Food insecurity    Worry: Often true    Inability: Often true  . Transportation needs    Medical: No    Non-medical: No  Tobacco Use  . Smoking status: Current Every Day Smoker    Packs/day: 1.00    Types: Cigarettes  . Smokeless tobacco: Current User    Types: Chew  Substance  and Sexual Activity  . Alcohol use: Yes    Alcohol/week: 2.0 standard drinks    Types: 2 Cans of beer per week  . Drug use: Not Currently    Types: Cocaine, Marijuana, Heroin, LSD, "Crack" cocaine  . Sexual activity: Not Currently  Lifestyle  . Physical activity    Days per week: 0 days    Minutes per session: 0 min  . Stress: Not at all  Relationships  . Social Musicianconnections    Talks on phone: Not on file    Gets together: Not on file    Attends religious service: Never    Active member of club or organization: No    Attends meetings of clubs or organizations: Never    Relationship status: Divorced  . Intimate partner violence    Fear of current or ex partner: Not on file    Emotionally abused: No    Physically abused: No    Forced sexual activity: No  Other Topics Concern  . Not on file  Social History Narrative  . Not on file      Review of Systems  Constitutional: Negative for chills, fatigue and unexpected weight change.  HENT: Negative for congestion, rhinorrhea, sneezing and sore throat.   Eyes: Negative for photophobia, pain and redness.  Respiratory: Negative for cough, chest tightness  and shortness of breath.   Cardiovascular: Negative for chest pain and palpitations.  Gastrointestinal: Negative for abdominal pain, constipation, diarrhea, nausea and vomiting.  Endocrine: Negative.   Genitourinary: Negative for dysuria and frequency.  Musculoskeletal: Negative for arthralgias, back pain, joint swelling and neck pain.  Skin: Negative for rash.  Allergic/Immunologic: Negative.   Neurological: Negative for tremors and numbness.  Hematological: Negative for adenopathy. Does not bruise/bleed easily.  Psychiatric/Behavioral: Negative for behavioral problems and sleep disturbance. The patient is not nervous/anxious.     Vital Signs: BP 114/72   Pulse 88   Resp 16   Ht 5\' 8"  (1.727 m)   Wt 206 lb (93.4 kg)   SpO2 93%   BMI 31.32 kg/m    Physical  Exam Vitals signs and nursing note reviewed.  Constitutional:      General: He is not in acute distress.    Appearance: He is well-developed. He is not diaphoretic.  HENT:     Head: Normocephalic and atraumatic.     Mouth/Throat:     Pharynx: No oropharyngeal exudate.  Eyes:     Pupils: Pupils are equal, round, and reactive to light.  Neck:     Musculoskeletal: Normal range of motion and neck supple.     Thyroid: No thyromegaly.     Vascular: No JVD.     Trachea: No tracheal deviation.  Cardiovascular:     Rate and Rhythm: Normal rate and regular rhythm.     Heart sounds: Normal heart sounds. No murmur. No friction rub. No gallop.   Pulmonary:     Effort: Pulmonary effort is normal. No respiratory distress.     Breath sounds: Normal breath sounds. No wheezing or rales.  Chest:     Chest wall: No tenderness.  Abdominal:     Palpations: Abdomen is soft.     Tenderness: There is no abdominal tenderness. There is no guarding.  Musculoskeletal: Normal range of motion.  Lymphadenopathy:     Cervical: No cervical adenopathy.  Skin:    General: Skin is warm and dry.  Neurological:     Mental Status: He is alert and oriented to person, place, and time.     Cranial Nerves: No cranial nerve deficit.  Psychiatric:        Behavior: Behavior normal.        Thought Content: Thought content normal.        Judgment: Judgment normal.    Assessment/Plan: 1. Gastroesophageal reflux disease without esophagitis Stable, continue present management.  2. Chronic obstructive pulmonary disease, unspecified COPD type (HCC) Stable, we discussed smoking cesation. Pt will continue to use oxygen at night.   3. Oxygen dependent Continue to use oxygen 2lpm as prescribed.   4. Transgender Pt is attempting to find a physician for HRT, that will accept her insurance.  We are also looking into places for her, and she reports self pay may be an option.   5. Nicotine dependence with current  use Smoking cessation counseling: 1. Pt acknowledges the risks of long term smoking, she will try to quite smoking. 2. Options for different medications including nicotine products, chewing gum, patch etc, Wellbutrin and Chantix is discussed 3. Goal and date of compete cessation is discussed 4. Total time spent in smoking cessation is 15 min.   General Counseling: Lytle Michaelsdward verbalizes understanding of the findings of todays visit and agrees with plan of treatment. I have discussed any further diagnostic evaluation that may be needed or ordered today. We also  reviewed his medications today. he has been encouraged to call the office with any questions or concerns that should arise related to todays visit.    No orders of the defined types were placed in this encounter.   No orders of the defined types were placed in this encounter.   Time spent: 20 Minutes   This patient was seen by Orson Gear AGNP-C in Collaboration with Dr Lavera Guise as a part of collaborative care agreement     Kendell Bane AGNP-C Internal medicine

## 2018-08-12 ENCOUNTER — Ambulatory Visit: Payer: Medicaid Other | Admitting: Adult Health

## 2018-08-14 ENCOUNTER — Ambulatory Visit: Payer: Medicaid Other | Admitting: Licensed Clinical Social Worker

## 2018-08-19 ENCOUNTER — Other Ambulatory Visit: Payer: Self-pay

## 2018-08-19 ENCOUNTER — Ambulatory Visit: Payer: Medicaid Other | Admitting: Licensed Clinical Social Worker

## 2018-10-02 ENCOUNTER — Other Ambulatory Visit: Payer: Self-pay

## 2018-10-02 ENCOUNTER — Ambulatory Visit: Payer: Medicaid Other | Admitting: Psychiatry

## 2018-10-04 ENCOUNTER — Other Ambulatory Visit: Payer: Self-pay | Admitting: Adult Health

## 2018-10-04 ENCOUNTER — Other Ambulatory Visit: Payer: Self-pay | Admitting: Psychiatry

## 2018-10-04 DIAGNOSIS — F17200 Nicotine dependence, unspecified, uncomplicated: Secondary | ICD-10-CM

## 2018-10-04 DIAGNOSIS — F3341 Major depressive disorder, recurrent, in partial remission: Secondary | ICD-10-CM

## 2018-11-06 ENCOUNTER — Encounter: Payer: Self-pay | Admitting: Adult Health

## 2018-11-22 ENCOUNTER — Telehealth: Payer: Self-pay

## 2018-11-22 NOTE — Telephone Encounter (Signed)
Confirmed appointment with patient. klh °

## 2018-11-26 ENCOUNTER — Other Ambulatory Visit: Payer: Self-pay

## 2018-11-26 ENCOUNTER — Ambulatory Visit: Payer: Medicaid Other | Admitting: Internal Medicine

## 2018-11-26 ENCOUNTER — Encounter: Payer: Self-pay | Admitting: Internal Medicine

## 2018-11-26 VITALS — BP 131/73 | HR 81 | Temp 98.1°F | Resp 16 | Ht 68.0 in | Wt 195.2 lb

## 2018-11-26 DIAGNOSIS — J449 Chronic obstructive pulmonary disease, unspecified: Secondary | ICD-10-CM

## 2018-11-26 DIAGNOSIS — G4734 Idiopathic sleep related nonobstructive alveolar hypoventilation: Secondary | ICD-10-CM | POA: Diagnosis not present

## 2018-11-26 DIAGNOSIS — F17200 Nicotine dependence, unspecified, uncomplicated: Secondary | ICD-10-CM

## 2018-11-26 DIAGNOSIS — Z9981 Dependence on supplemental oxygen: Secondary | ICD-10-CM | POA: Diagnosis not present

## 2018-11-26 MED ORDER — ALBUTEROL SULFATE (2.5 MG/3ML) 0.083% IN NEBU: 2.5000 mg | INHALATION_SOLUTION | Freq: Four times a day (QID) | RESPIRATORY_TRACT | 2 refills | Status: DC | PRN

## 2018-11-26 NOTE — Progress Notes (Signed)
[facility] [place], [place] [address]  Pulmonary Sleep Medicine   Office Visit Note  Patient Name: Clifford Burgess DOB: [birth year] MRN [id number]  Date of Service: 11/26/2018  Complaints/HPI: Pt is here for follow up on copd.  She continues to use oxygen at night.  Denies any current issues with breathing.  She denies any hospitalizations, or issues currently. Unfortunately continues to smoke cigarettes.     ROS  General: (-) fever, (-) chills, (-) night sweats, (-) weakness Skin: (-) rashes, (-) itching,. Eyes: (-) visual changes, (-) redness, (-) itching. Nose and Sinuses: (-) nasal stuffiness or itchiness, (-) postnasal drip, (-) nosebleeds, (-) sinus trouble. Mouth and Throat: (-) sore throat, (-) hoarseness. Neck: (-) swollen glands, (-) enlarged thyroid, (-) neck pain. Respiratory: - cough, (-) bloody sputum, - shortness of breath, - wheezing. Cardiovascular: - ankle swelling, (-) chest pain. Lymphatic: (-) lymph node enlargement. Neurologic: (-) numbness, (-) tingling. Psychiatric: (-) anxiety, (-) depression   Current Medication: Outpatient Encounter Medications as of 11/26/2018  Medication Sig  . buPROPion (WELLBUTRIN SR) 150 MG 12 hr tablet TAKE 1 TABLET BY MOUTH TWICE A DAY  . omeprazole (PRILOSEC) 20 MG capsule TAKE 1 CAPSULE BY MOUTH EVERY DAY  . OXYGEN Inhale 2 L into the lungs. At night   No facility-administered encounter medications on file as of 11/26/2018.     Surgical History: Past Surgical History:  Procedure Laterality Date  . HERNIA REPAIR      Medical History: Past Medical History:  Diagnosis Date  . Asthma   . COPD (chronic obstructive pulmonary disease) ([place])   . GERD (gastroesophageal reflux disease)     Family History: Family History  Problem Relation Age of Onset  . Hypertension Father     Social History: Social History   Socioeconomic History  . Marital status: Divorced    Spouse name: Not  on file  . Number of children: 2  . Years of education: Not on file  . Highest education level: 8th grade  Occupational History  . Not on file  Social Needs  . Financial resource strain: Hard  . Food insecurity    Worry: Often true    Inability: Often true  . Transportation needs    Medical: No    Non-medical: No  Tobacco Use  . Smoking status: Current Every Day Smoker    Packs/day: 1.00    Types: Cigarettes  . Smokeless tobacco: Current User    Types: Chew  Substance and Sexual Activity  . Alcohol use: Yes    Alcohol/week: 2.0 standard drinks    Types: 2 Cans of beer per week  . Drug use: Not Currently    Types: Cocaine, Marijuana, Heroin, LSD, "Crack" cocaine  . Sexual activity: Not Currently  Lifestyle  . Physical activity    Days per week: 0 days    Minutes per session: 0 min  . Stress: Not at all  Relationships  . Social Herbalist on phone: Not on file    Gets together: Not on file    Attends religious service: Never    Active member of club or organization: No    Attends meetings of clubs or organizations: Never    Relationship status: Divorced  . Intimate partner violence    Fear of current or ex partner: Not on file    Emotionally abused: No    Physically abused: No    Forced sexual activity: No  Other  Topics Concern  . Not on file  Social History Narrative  . Not on file    Vital Signs: Blood pressure 131/73, pulse 81, temperature 98.1 F (36.7 C), resp. rate 16, height 5\' 8"  (1.727 m), weight 195 lb 3.2 oz (88.5 kg), SpO2 94 %.  Examination: General Appearance: The patient is well-developed, well-nourished, and in no distress. Skin: Gross inspection of skin unremarkable. Head: normocephalic, no gross deformities. Eyes: no gross deformities noted. ENT: ears appear grossly normal no exudates. Neck: Supple. No thyromegaly. No LAD. Respiratory: wheezing. Cardiovascular: Normal S1 and S2 without murmur or rub. Extremities: No cyanosis.  pulses are equal. Neurologic: Alert and oriented. No involuntary movements.  LABS: No results found for this or any previous visit (from the past 2160 hour(s)).  Radiology: No results found.  No results found.  No results found.    Assessment and Plan: Patient Active Problem List   Diagnosis Date Noted  . 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 MDD (major depressive disorder), recurrent, in partial remission (HCC) 07/09/2018  . Alcohol use disorder, moderate, dependence (HCC) 07/09/2018  . Tobacco use disorder 07/09/2018  . Bipolar 1 disorder, depressed, severe (HCC) 01/25/2018  . Drug overdose 01/23/2018  . COPD exacerbation (HCC) 11/27/2017  . GERD (gastroesophageal reflux disease) 11/27/2017  . 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 Smoker 10/15/2017  . SOB (shortness of breath) 10/15/2017  . Alcohol abuse 10/05/2017  . Drug abuse (HCC) 10/05/2017   1. Chronic obstructive pulmonary disease, unspecified COPD type (HCC) Stable at this time Nebulizer given to patient, with albuterol tx.   2. Oxygen dependent Continue present use of oxygen.    3. Nocturnal hypoxia Continue to use 2 LPM of oxygen at prescribed.   4. Nicotine dependence with current use Smoking cessation counseling: 1. Pt acknowledges the risks of long term smoking, she will try to quite smoking. 2. Options for different medications including nicotine products, chewing gum, patch etc, Wellbutrin and Chantix is discussed 3. Goal and date of compete cessation is discussed 4. Total time spent in smoking cessation is 15 min.    General Counseling: I have discussed the findings of the evaluation and examination with 10/07/2017.  I have also discussed any further diagnostic evaluation thatmay be needed or ordered today. Clifford Burgess verbalizes understanding of the findings of todays visit. We also reviewed his medications today and discussed drug interactions and side effects including but  not limited excessive drowsiness and altered mental states. We also discussed that there is always a risk not just to him but also people around him. he has been encouraged to call the office with any questions or concerns that should arise related to todays visit.    Time spent: 15 This patient was seen by Ramon Dredge AGNP-C in Collaboration with Dr. Blima Ledger as a part of collaborative care agreement.   I have personally obtained a history, examined the patient, evaluated laboratory and imaging results, formulated the assessment and plan and placed orders.    Freda Munro, MD [facility] Pulmonary and Critical Care Sleep medicine

## 2018-11-27 ENCOUNTER — Telehealth: Payer: Self-pay

## 2018-11-27 NOTE — Telephone Encounter (Signed)
Gave pt nebulizer machine in office and also gave paper work for nebulizer to [employer] from [employer]

## 2018-11-28 ENCOUNTER — Telehealth: Payer: Self-pay

## 2018-11-28 NOTE — Telephone Encounter (Signed)
Confirmed appointment with patient. klh °

## 2018-12-02 ENCOUNTER — Other Ambulatory Visit: Payer: Self-pay

## 2018-12-02 ENCOUNTER — Encounter: Payer: Self-pay | Admitting: Adult Health

## 2018-12-02 ENCOUNTER — Ambulatory Visit: Payer: Medicaid Other | Admitting: Adult Health

## 2018-12-02 VITALS — BP 135/68 | HR 72 | Temp 98.2°F | Resp 16 | Ht 68.0 in | Wt 196.6 lb

## 2018-12-02 DIAGNOSIS — K219 Gastro-esophageal reflux disease without esophagitis: Secondary | ICD-10-CM | POA: Diagnosis not present

## 2018-12-02 DIAGNOSIS — J449 Chronic obstructive pulmonary disease, unspecified: Secondary | ICD-10-CM

## 2018-12-02 DIAGNOSIS — Z0001 Encounter for general adult medical examination with abnormal findings: Secondary | ICD-10-CM

## 2018-12-02 DIAGNOSIS — Z9981 Dependence on supplemental oxygen: Secondary | ICD-10-CM

## 2018-12-02 DIAGNOSIS — Z23 Encounter for immunization: Secondary | ICD-10-CM | POA: Diagnosis not present

## 2018-12-02 DIAGNOSIS — Z125 Encounter for screening for malignant neoplasm of prostate: Secondary | ICD-10-CM

## 2018-12-02 DIAGNOSIS — F1010 Alcohol abuse, uncomplicated: Secondary | ICD-10-CM

## 2018-12-02 DIAGNOSIS — F640 Transsexualism: Secondary | ICD-10-CM

## 2018-12-02 DIAGNOSIS — F339 Major depressive disorder, recurrent, unspecified: Secondary | ICD-10-CM

## 2018-12-02 DIAGNOSIS — Z789 Other specified health status: Secondary | ICD-10-CM

## 2018-12-02 DIAGNOSIS — R300 Dysuria: Secondary | ICD-10-CM

## 2018-12-02 DIAGNOSIS — F17210 Nicotine dependence, cigarettes, uncomplicated: Secondary | ICD-10-CM

## 2018-12-02 NOTE — Progress Notes (Signed)
[facility] [address] [place], [place] [address]  Internal MEDICINE  Office Visit Note  Patient Name: Clifford Burgess  [id number]  [id number]  Date of Service: 12/02/2018  Chief Complaint  Patient presents with  . Medical Management of Chronic Issues  . Annual Exam  . Gastroesophageal Reflux     HPI Pt is here for routine health maintenance examination.  Pt is a 48 yo transgender male to male.  She has a history of COPD, nocturnal hypoxia, GERD, depression, and smoking. Overall she is doing well.  Denies any overt issues at this time.  Has not been hospitalized recently. She is smiling and laughing in the exam room.  She is using her oxygen at night while she sleeps per order, and feels like her depression and gerd are well controlled on current medications.   Current Medication: Outpatient Encounter Medications as of 12/02/2018  Medication Sig  . albuterol (PROVENTIL) (2.5 MG/3ML) 0.083% nebulizer solution Take 3 mLs (2.5 mg total) by nebulization every 6 (six) hours as needed for wheezing or shortness of breath.  Marland Kitchen buPROPion (WELLBUTRIN SR) 150 MG 12 hr tablet TAKE 1 TABLET BY MOUTH TWICE A DAY  . omeprazole (PRILOSEC) 20 MG capsule TAKE 1 CAPSULE BY MOUTH EVERY DAY  . OXYGEN Inhale 2 L into the lungs. At night   No facility-administered encounter medications on file as of 12/02/2018.     Surgical History: Past Surgical History:  Procedure Laterality Date  . HERNIA REPAIR      Medical History: Past Medical History:  Diagnosis Date  . Asthma   . COPD (chronic obstructive pulmonary disease) (HCC)   . GERD (gastroesophageal reflux disease)     Family History: Family History  Problem Relation Age of Onset  . Hypertension Father       Review of Systems  Constitutional: Negative for chills, fatigue and unexpected weight change.  HENT: Negative for congestion, rhinorrhea, sneezing and sore throat.   Eyes: Negative for photophobia, pain and  redness.  Respiratory: Negative for cough, chest tightness and shortness of breath.   Cardiovascular: Negative for chest pain and palpitations.  Gastrointestinal: Negative for abdominal pain, constipation, diarrhea, nausea and vomiting.  Endocrine: Negative.   Genitourinary: Negative for dysuria and frequency.  Musculoskeletal: Negative for arthralgias, back pain, joint swelling and neck pain.  Skin: Negative for rash.  Allergic/Immunologic: Negative.   Neurological: Negative for tremors and numbness.  Hematological: Negative for adenopathy. Does not bruise/bleed easily.  Psychiatric/Behavioral: Negative for behavioral problems and sleep disturbance. The patient is not nervous/anxious.      Vital Signs: BP 135/68   Pulse 72   Temp 98.2 F (36.8 C)   Resp 16   Ht 5\' 8"  (1.727 m)   Wt 196 lb 9.6 oz (89.2 kg)   SpO2 97%   BMI 29.89 kg/m    Physical Exam Vitals signs and nursing note reviewed.  Constitutional:      General: He is not in acute distress.    Appearance: He is well-developed. He is not diaphoretic.  HENT:     Head: Normocephalic and atraumatic.     Mouth/Throat:     Pharynx: No oropharyngeal exudate.  Eyes:     Pupils: Pupils are equal, round, and reactive to light.  Neck:     Musculoskeletal: Normal range of motion and neck supple.     Thyroid: No thyromegaly.     Vascular: No JVD.     Trachea: No tracheal deviation.  Cardiovascular:  Rate and Rhythm: Normal rate and regular rhythm.     Heart sounds: Normal heart sounds. No murmur. No friction rub. No gallop.   Pulmonary:     Effort: Pulmonary effort is normal. No respiratory distress.     Breath sounds: Normal breath sounds. No wheezing or rales.  Chest:     Chest wall: No tenderness.  Abdominal:     Palpations: Abdomen is soft.     Tenderness: There is no abdominal tenderness. There is no guarding.  Musculoskeletal: Normal range of motion.  Lymphadenopathy:     Cervical: No cervical adenopathy.   Skin:    General: Skin is warm and dry.  Neurological:     Mental Status: He is alert and oriented to person, place, and time.     Cranial Nerves: No cranial nerve deficit.  Psychiatric:        Behavior: Behavior normal.        Thought Content: Thought content normal.        Judgment: Judgment normal.      LABS: No results found for this or any previous visit (from the past 2160 hour(s)).   Assessment/Plan: 1. Encounter for general adult medical examination with abnormal findings Up to date on PHM, will review labs when available.  - CBC with Differential/Platelet - Lipid Panel With LDL/HDL Ratio - TSH - T4, free - Comprehensive metabolic panel  2. Chronic obstructive pulmonary disease, unspecified COPD type ([place]) Controlled, unfortunately continues to smoke.  Continue present management.  3. Gastroesophageal reflux disease without esophagitis Stable, continue med management with prilosec.   4. Depression, recurrent ([place]) Stable currently, continue present management.   21. Transgender PT is male to male, not currently on hormones, seeing counselor.   6. Tobacco dependence due to cigarettes Smoking cessation counseling: 1. Pt acknowledges the risks of long term smoking, she will try to quite smoking. 2. Options for different medications including nicotine products, chewing gum, patch etc, Wellbutrin and Chantix is discussed 3. Goal and date of compete cessation is discussed 4. Total time spent in smoking cessation is 15 min.  7. Alcohol abuse Pt reports cutting back on alcohol intake, however is still drinking, but not daily. Continued to encourage sobriety.     8. Dependence on nocturnal oxygen therapy Continue to use oxygen while sleeping.   9. Flu vaccine need - Flu Vaccine MDCK QUAD PF  10. Screening for prostate cancer - PSA  11. Dysuria - UA/M w/rflx Culture, Routine  General Counseling: blaine hari understanding of the findings of todays  visit and agrees with plan of treatment. I have discussed any further diagnostic evaluation that may be needed or ordered today. We also reviewed his medications today. he has been encouraged to call the office with any questions or concerns that should arise related to todays visit.   Orders Placed This Encounter  Procedures  . Flu Vaccine MDCK QUAD PF  . UA/M w/rflx Culture, Routine  . CBC with Differential/Platelet  . Lipid Panel With LDL/HDL Ratio  . TSH  . T4, free  . Comprehensive metabolic panel  . PSA    No orders of the defined types were placed in this encounter.   Time spent: 30 Minutes   This patient was seen by Orson Gear AGNP-C in Collaboration with Dr Lavera Guise as a part of collaborative care agreement    Kendell Bane AGNP-C Internal Medicine

## 2018-12-03 LAB — UA/M W/RFLX CULTURE, ROUTINE
Bilirubin, UA: NEGATIVE
Ketones, UA: NEGATIVE
Leukocytes,UA: NEGATIVE
Nitrite, UA: NEGATIVE
Protein,UA: NEGATIVE
RBC, UA: NEGATIVE
Specific Gravity, UA: 1.006 (ref 1.005–1.030)
Urobilinogen, Ur: 0.2 mg/dL (ref 0.2–1.0)
pH, UA: 6.5 (ref 5.0–7.5)

## 2018-12-03 LAB — MICROSCOPIC EXAMINATION
Bacteria, UA: NONE SEEN
Casts: NONE SEEN /lpf
Epithelial Cells (non renal): NONE SEEN /hpf (ref 0–10)
RBC, Urine: NONE SEEN /hpf (ref 0–2)

## 2018-12-23 ENCOUNTER — Telehealth: Payer: Self-pay

## 2018-12-23 NOTE — Telephone Encounter (Signed)
CMN SIGNED AND PLACED IN [employer] PATIENT FOLDER. °

## 2019-01-08 ENCOUNTER — Telehealth: Payer: Self-pay

## 2019-01-08 NOTE — Telephone Encounter (Signed)
Confirmed appointment with patient. klh °

## 2019-01-12 ENCOUNTER — Other Ambulatory Visit: Payer: Self-pay | Admitting: Adult Health

## 2019-01-12 ENCOUNTER — Other Ambulatory Visit: Payer: Self-pay | Admitting: Psychiatry

## 2019-01-12 DIAGNOSIS — F17200 Nicotine dependence, unspecified, uncomplicated: Secondary | ICD-10-CM

## 2019-01-12 DIAGNOSIS — F3341 Major depressive disorder, recurrent, in partial remission: Secondary | ICD-10-CM

## 2019-01-13 ENCOUNTER — Ambulatory Visit: Payer: Medicaid Other | Admitting: Adult Health

## 2019-01-13 ENCOUNTER — Encounter: Payer: Self-pay | Admitting: Adult Health

## 2019-01-13 ENCOUNTER — Other Ambulatory Visit: Payer: Self-pay

## 2019-01-13 VITALS — BP 133/80 | HR 78 | Temp 96.9°F | Resp 16 | Ht 68.0 in | Wt 191.4 lb

## 2019-01-13 DIAGNOSIS — J449 Chronic obstructive pulmonary disease, unspecified: Secondary | ICD-10-CM

## 2019-01-13 DIAGNOSIS — F17210 Nicotine dependence, cigarettes, uncomplicated: Secondary | ICD-10-CM | POA: Diagnosis not present

## 2019-01-13 DIAGNOSIS — F640 Transsexualism: Secondary | ICD-10-CM

## 2019-01-13 DIAGNOSIS — Z9981 Dependence on supplemental oxygen: Secondary | ICD-10-CM

## 2019-01-13 DIAGNOSIS — K219 Gastro-esophageal reflux disease without esophagitis: Secondary | ICD-10-CM | POA: Diagnosis not present

## 2019-01-13 DIAGNOSIS — Z789 Other specified health status: Secondary | ICD-10-CM

## 2019-01-13 DIAGNOSIS — G4734 Idiopathic sleep related nonobstructive alveolar hypoventilation: Secondary | ICD-10-CM

## 2019-01-13 MED ORDER — OMEPRAZOLE 20 MG PO CPDR: 20.0000 mg | DELAYED_RELEASE_CAPSULE | Freq: Every day | ORAL | 1 refills | Status: DC

## 2019-01-13 MED ORDER — ALBUTEROL SULFATE (2.5 MG/3ML) 0.083% IN NEBU: 2.5000 mg | INHALATION_SOLUTION | Freq: Four times a day (QID) | RESPIRATORY_TRACT | 2 refills | Status: DC | PRN

## 2019-01-13 MED ORDER — ALBUTEROL SULFATE HFA 108 (90 BASE) MCG/ACT IN AERS: 2.0000 | INHALATION_SPRAY | Freq: Four times a day (QID) | RESPIRATORY_TRACT | 3 refills | Status: DC | PRN

## 2019-01-13 NOTE — Progress Notes (Signed)
[facility] [address] [place], [place] [address]  Internal MEDICINE  Office Visit Note  Patient Name: Clifford Burgess  [id number]  [id number]  Date of Service: 01/13/2019  Chief Complaint  Patient presents with  . Asthma  . Gastroesophageal Reflux  . COPD  . Follow-up    labs    HPI  Pt is here for follow up.  She was unable to have labs drawn at this time. Overall she feels that she is doing well.  She has weaned herself off of Wellbutrin.  She has not had medicine in over a month.  Denies any issues with wean.  She is very happy and does not wish to go on another medication at this time.  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    Current Medication: Outpatient Encounter Medications as of 01/13/2019  Medication Sig  . aspirin EC 81 MG tablet Take 81 mg by mouth daily.  Marland Kitchen estradiol (ESTRACE) 2 MG tablet Take 2 mg by mouth daily.  . OXYGEN Inhale 2 L into the lungs. At night  . [DISCONTINUED] albuterol (PROVENTIL) (2.5 MG/3ML) 0.083% nebulizer solution Take 3 mLs (2.5 mg total) by nebulization every 6 (six) hours as needed for wheezing or shortness of breath.  . [DISCONTINUED] buPROPion (WELLBUTRIN SR) 150 MG 12 hr tablet TAKE 1 TABLET BY MOUTH TWICE A DAY  . [DISCONTINUED] omeprazole (PRILOSEC) 20 MG capsule TAKE 1 CAPSULE BY MOUTH EVERY DAY  . albuterol (PROVENTIL) (2.5 MG/3ML) 0.083% nebulizer solution Take 3 mLs (2.5 mg total) by nebulization every 6 (six) hours as needed for wheezing or shortness of breath.  Marland Kitchen albuterol (VENTOLIN HFA) 108 (90 Base) MCG/ACT inhaler Inhale 2 puffs into the lungs every 6 (six) hours as needed for wheezing or shortness of breath.  Marland Kitchen omeprazole (PRILOSEC) 20 MG capsule Take 1 capsule (20 mg total) by mouth daily.   No facility-administered encounter medications on file as of 01/13/2019.    Surgical History: Past Surgical History:  Procedure Laterality Date  . HERNIA  REPAIR      Medical History: Past Medical History:  Diagnosis Date  . Asthma   . COPD (chronic obstructive pulmonary disease) (HCC)   . GERD (gastroesophageal reflux disease)     Family History: Family History  Problem Relation Age of Onset  . Hypertension Father     Social History   Socioeconomic History  . Marital status: Divorced    Spouse name: Not on file  . Number of children: 2  . Years of education: Not on file  . Highest education level: 8th grade  Occupational History  . Not on file  Tobacco Use  . Smoking status: Current Every Day Smoker    Packs/day: 0.50    Types: Cigarettes  . Smokeless tobacco: Current User    Types: Chew  Substance and Sexual Activity  . Alcohol use: Yes    Alcohol/week: 2.0 standard drinks    Types: 2 Cans of beer per week  . Drug use: Not Currently    Types: Cocaine, Marijuana, Heroin, LSD, "Crack" cocaine  . Sexual activity: Not Currently  Other Topics Concern  . Not on file  Social History Narrative  . Not on file   Social Determinants of Health   Financial Resource Strain:   . Difficulty of Paying Living Expenses: Not on file  Food Insecurity:   . Worried About Programme researcher, broadcasting/film/video in the Last Year: Not on  file  . Bigfoot in the Last Year: Not on file  Transportation Needs:   . Lack of Transportation (Medical): Not on file  . Lack of Transportation (Non-Medical): Not on file  Physical Activity:   . Days of Exercise per Week: Not on file  . Minutes of Exercise per Session: Not on file  Stress:   . Feeling of Stress : Not on file  Social Connections:   . Frequency of Communication with Friends and Family: Not on file  . Frequency of Social Gatherings with Friends and Family: Not on file  . Attends Religious Services: Not on file  . Active Member of Clubs or Organizations: Not on file  . Attends Archivist Meetings: Not on file  . Marital Status: Not on file  Intimate Partner Violence:   . Fear of  Current or Ex-Partner: Not on file  . Emotionally Abused: Not on file  . Physically Abused: Not on file  . Sexually Abused: Not on file      Review of Systems  Constitutional: Negative for chills, fatigue and unexpected weight change.  HENT: Negative for congestion, rhinorrhea, sneezing and sore throat.   Eyes: Negative for photophobia, pain and redness.  Respiratory: Negative for cough, chest tightness and shortness of breath.   Cardiovascular: Negative for chest pain and palpitations.  Gastrointestinal: Negative for abdominal pain, constipation, diarrhea, nausea and vomiting.  Endocrine: Negative.   Genitourinary: Negative for dysuria and frequency.  Musculoskeletal: Negative for arthralgias, back pain, joint swelling and neck pain.  Skin: Negative for rash.  Allergic/Immunologic: Negative.   Neurological: Negative for tremors and numbness.  Hematological: Negative for adenopathy. Does not bruise/bleed easily.  Psychiatric/Behavioral: Negative for behavioral problems and sleep disturbance. The patient is not nervous/anxious.     Vital Signs: BP 133/80   Pulse 78   Temp (!) 96.9 F (36.1 C)   Resp 16   Ht 5\' 8"  (1.727 m)   Wt 191 lb 6.4 oz (86.8 kg)   SpO2 95%   BMI 29.10 kg/m    Physical Exam Vitals and nursing note reviewed.  Constitutional:      General: She is not in acute distress.    Appearance: She is well-developed. She is not diaphoretic.  HENT:     Head: Normocephalic and atraumatic.     Mouth/Throat:     Pharynx: No oropharyngeal exudate.  Eyes:     Pupils: Pupils are equal, round, and reactive to light.  Neck:     Thyroid: No thyromegaly.     Vascular: No JVD.     Trachea: No tracheal deviation.  Cardiovascular:     Rate and Rhythm: Normal rate and regular rhythm.     Heart sounds: Normal heart sounds. No murmur. No friction rub. No gallop.   Pulmonary:     Effort: Pulmonary effort is normal. No respiratory distress.     Breath sounds: Normal  breath sounds. No wheezing or rales.  Chest:     Chest wall: No tenderness.  Abdominal:     Palpations: Abdomen is soft.     Tenderness: There is no abdominal tenderness. There is no guarding.  Musculoskeletal:        General: Normal range of motion.     Cervical back: Normal range of motion and neck supple.  Lymphadenopathy:     Cervical: No cervical adenopathy.  Skin:    General: Skin is warm and dry.  Neurological:     Mental Status: She  is alert and oriented to person, place, and time.     Cranial Nerves: No cranial nerve deficit.  Psychiatric:        Behavior: Behavior normal.        Thought Content: Thought content normal.        Judgment: Judgment normal.     Assessment/Plan: 1. Chronic obstructive pulmonary disease, unspecified COPD type (HCC) Controlled, continue present management.  - albuterol (PROVENTIL) (2.5 MG/3ML) 0.083% nebulizer solution; Take 3 mLs (2.5 mg total) by nebulization every 6 (six) hours as needed for wheezing or shortness of breath.  Dispense: 150 mL; Refill: 2 - albuterol (VENTOLIN HFA) 108 (90 Base) MCG/ACT inhaler; Inhale 2 puffs into the lungs every 6 (six) hours as needed for wheezing or shortness of breath.  Dispense: 18 g; Refill: 3  2. Gastroesophageal reflux disease without esophagitis Continue Prilosec as prescribed.  - omeprazole (PRILOSEC) 20 MG capsule; Take 1 capsule (20 mg total) by mouth daily.  Dispense: 90 capsule; Refill: 1  3. Transgender Started Estrogen replacement therapy.  continue to follow.   4. Tobacco dependence due to cigarettes Smoking cessation counseling: 1. Pt acknowledges the risks of long term smoking, she will try to quite smoking. 2. Options for different medications including nicotine products, chewing gum, patch etc, Wellbutrin and Chantix is discussed 3. Goal and date of compete cessation is discussed 4. Total time spent in smoking cessation is 15 min.  5. Nocturnal hypoxia Continue to use oxygen therapy  at night.   6. Oxygen dependent 2 lPM at night.  General Counseling: vernie piet understanding of the findings of todays visit and agrees with plan of treatment. I have discussed any further diagnostic evaluation that may be needed or ordered today. We also reviewed her medications today. she has been encouraged to call the office with any questions or concerns that should arise related to todays visit.    No orders of the defined types were placed in this encounter.   Meds ordered this encounter  Medications  . albuterol (PROVENTIL) (2.5 MG/3ML) 0.083% nebulizer solution    Sig: Take 3 mLs (2.5 mg total) by nebulization every 6 (six) hours as needed for wheezing or shortness of breath.    Dispense:  150 mL    Refill:  2  . albuterol (VENTOLIN HFA) 108 (90 Base) MCG/ACT inhaler    Sig: Inhale 2 puffs into the lungs every 6 (six) hours as needed for wheezing or shortness of breath.    Dispense:  18 g    Refill:  3  . omeprazole (PRILOSEC) 20 MG capsule    Sig: Take 1 capsule (20 mg total) by mouth daily.    Dispense:  90 capsule    Refill:  1    Time spent: 25 Minutes   This patient was seen by Blima Ledger AGNP-C in Collaboration with Dr Lyndon Code as a part of collaborative care agreement     Johnna Acosta AGNP-C Internal medicine

## 2019-01-23 ENCOUNTER — Telehealth: Payer: Self-pay

## 2019-01-23 NOTE — Telephone Encounter (Signed)
CMN SIGNED AND PLACED IN [employer] PATIENT FOLDER. °

## 2019-02-17 ENCOUNTER — Other Ambulatory Visit
Admission: RE | Admit: 2019-02-17 | Discharge: 2019-02-17 | Disposition: A | Discharge status: Home / Self Care | Payer: Medicaid Other | Source: Ambulatory Visit | Attending: Adult Health | Admitting: Adult Health

## 2019-02-17 DIAGNOSIS — Z0001 Encounter for general adult medical examination with abnormal findings: Secondary | ICD-10-CM | POA: Diagnosis present

## 2019-02-17 DIAGNOSIS — Z125 Encounter for screening for malignant neoplasm of prostate: Secondary | ICD-10-CM | POA: Diagnosis not present

## 2019-02-17 LAB — CBC WITH DIFFERENTIAL/PLATELET
Abs Immature Granulocytes: 0.01 10*3/uL (ref 0.00–0.07)
Basophils Absolute: 0 10*3/uL (ref 0.0–0.1)
Basophils Relative: 1 %
Eosinophils Absolute: 0.1 10*3/uL (ref 0.0–0.5)
Eosinophils Relative: 2 %
HCT: 44.4 % (ref 39.0–52.0)
Hemoglobin: 14.3 g/dL (ref 13.0–17.0)
Immature Granulocytes: 0 %
Lymphocytes Relative: 30 %
Lymphs Abs: 1.9 10*3/uL (ref 0.7–4.0)
MCH: 29.7 pg (ref 26.0–34.0)
MCHC: 32.2 g/dL (ref 30.0–36.0)
MCV: 92.1 fL (ref 80.0–100.0)
Monocytes Absolute: 0.7 10*3/uL (ref 0.1–1.0)
Monocytes Relative: 10 %
Neutro Abs: 3.6 10*3/uL (ref 1.7–7.7)
Neutrophils Relative %: 57 %
Platelets: 382 10*3/uL (ref 150–400)
RBC: 4.82 MIL/uL (ref 4.22–5.81)
RDW: 12.8 % (ref 11.5–15.5)
WBC: 6.3 10*3/uL (ref 4.0–10.5)
nRBC: 0 % (ref 0.0–0.2)

## 2019-02-17 LAB — T4, FREE: Free T4: 0.77 ng/dL (ref 0.61–1.12)

## 2019-02-17 LAB — COMPREHENSIVE METABOLIC PANEL
ALT: 13 U/L (ref 0–44)
AST: 15 U/L (ref 15–41)
Albumin: 3.8 g/dL (ref 3.5–5.0)
Alkaline Phosphatase: 52 U/L (ref 38–126)
Anion gap: 7 (ref 5–15)
BUN: 13 mg/dL (ref 6–20)
CO2: 29 mmol/L (ref 22–32)
Calcium: 9 mg/dL (ref 8.9–10.3)
Chloride: 99 mmol/L (ref 98–111)
Creatinine, Ser: 0.89 mg/dL (ref 0.61–1.24)
GFR calc Af Amer: >60 mL/min (ref >60)
GFR calc non Af Amer: >60 mL/min (ref >60)
Glucose, Bld: 87 mg/dL (ref 70–99)
Potassium: 4 mmol/L (ref 3.5–5.1)
Sodium: 135 mmol/L (ref 135–145)
Total Bilirubin: 0.4 mg/dL (ref 0.3–1.2)
Total Protein: 7.1 g/dL (ref 6.5–8.1)

## 2019-02-17 LAB — LIPID PANEL
Cholesterol: 195 mg/dL (ref 0–200)
HDL: 48 mg/dL (ref >40)
LDL Cholesterol: 120 mg/dL — ABNORMAL HIGH (ref 0–99)
Total CHOL/HDL Ratio: 4.1 RATIO
Triglycerides: 135 mg/dL (ref <150)
VLDL: 27 mg/dL (ref 0–40)

## 2019-02-17 LAB — PSA: Prostatic Specific Antigen: 1.1 ng/mL (ref 0.00–4.00)

## 2019-02-17 LAB — TSH: TSH: 1.695 u[IU]/mL (ref 0.350–4.500)

## 2019-02-20 ENCOUNTER — Telehealth: Payer: Self-pay

## 2019-02-20 NOTE — Telephone Encounter (Signed)
Called confirmed appointment on 02/24/2019 and screened for covid. klh

## 2019-02-24 ENCOUNTER — Encounter: Payer: Self-pay | Admitting: Adult Health

## 2019-02-24 ENCOUNTER — Other Ambulatory Visit: Payer: Self-pay

## 2019-02-24 ENCOUNTER — Ambulatory Visit: Payer: Medicaid Other | Admitting: Adult Health

## 2019-02-24 VITALS — BP 124/81 | HR 61 | Temp 95.3°F | Resp 16 | Ht 68.0 in | Wt 193.2 lb

## 2019-02-24 DIAGNOSIS — K219 Gastro-esophageal reflux disease without esophagitis: Secondary | ICD-10-CM

## 2019-02-24 DIAGNOSIS — Z9981 Dependence on supplemental oxygen: Secondary | ICD-10-CM

## 2019-02-24 DIAGNOSIS — G4734 Idiopathic sleep related nonobstructive alveolar hypoventilation: Secondary | ICD-10-CM

## 2019-02-24 DIAGNOSIS — J449 Chronic obstructive pulmonary disease, unspecified: Secondary | ICD-10-CM | POA: Diagnosis not present

## 2019-02-24 DIAGNOSIS — F17210 Nicotine dependence, cigarettes, uncomplicated: Secondary | ICD-10-CM | POA: Diagnosis not present

## 2019-02-24 DIAGNOSIS — F640 Transsexualism: Secondary | ICD-10-CM | POA: Diagnosis not present

## 2019-02-24 DIAGNOSIS — Z789 Other specified health status: Secondary | ICD-10-CM

## 2019-02-24 NOTE — Progress Notes (Signed)
[facility] [address] [place], [place] [address]  Internal MEDICINE  Office Visit Note  Patient Name: Clifford Burgess  [id number]  [id number]  Date of Service: 02/24/2019  Chief Complaint  Patient presents with  . Follow-up    review labs and other test  . Gastroesophageal Reflux    HPI  Pt is seen at this time for follow up to review labs.  We discussed her lab results, and the only abnormal value was elevated LDL cholesterol.  She verbalized understanding of this and we discussed lifestyle modifications.  Overall she is doing well.  Her GERD is well controlled with omeprazole.  She has started on spironolactone since our last visit as an androgen blocker in combination with estrace.  She denies any issues with her medications.    Current Medication: Outpatient Encounter Medications as of 02/24/2019  Medication Sig  . albuterol (PROVENTIL) (2.5 MG/3ML) 0.083% nebulizer solution Take 3 mLs (2.5 mg total) by nebulization every 6 (six) hours as needed for wheezing or shortness of breath.  Marland Kitchen albuterol (VENTOLIN HFA) 108 (90 Base) MCG/ACT inhaler Inhale 2 puffs into the lungs every 6 (six) hours as needed for wheezing or shortness of breath.  Marland Kitchen aspirin EC 81 MG tablet Take 81 mg by mouth daily.  Marland Kitchen estradiol (ESTRACE) 2 MG tablet Take 2 mg by mouth 2 (two) times daily.   Marland Kitchen omeprazole (PRILOSEC) 20 MG capsule Take 1 capsule (20 mg total) by mouth daily.  . OXYGEN Inhale 2 L into the lungs. At night  . spironolactone (ALDACTONE) 50 MG tablet Take 50 mg by mouth once.   No facility-administered encounter medications on file as of 02/24/2019.    Surgical History: Past Surgical History:  Procedure Laterality Date  . HERNIA REPAIR      Medical History: Past Medical History:  Diagnosis Date  . Asthma   . COPD (chronic obstructive pulmonary disease) (HCC)   . GERD (gastroesophageal reflux disease)     Family History: Family History  Problem Relation Age of  Onset  . Hypertension Father     Social History   Socioeconomic History  . Marital status: Divorced    Spouse name: Not on file  . Number of children: 2  . Years of education: Not on file  . Highest education level: 8th grade  Occupational History  . Not on file  Tobacco Use  . Smoking status: Current Every Day Smoker    Packs/day: 0.50    Types: Cigarettes  . Smokeless tobacco: Current User    Types: Chew  Substance and Sexual Activity  . Alcohol use: Yes    Alcohol/week: 2.0 standard drinks    Types: 2 Cans of beer per week  . Drug use: Not Currently    Types: Cocaine, Marijuana, Heroin, LSD, "Crack" cocaine  . Sexual activity: Not Currently  Other Topics Concern  . Not on file  Social History Narrative  . Not on file   Social Determinants of Health   Financial Resource Strain:   . Difficulty of Paying Living Expenses: Not on file  Food Insecurity:   . Worried About Programme researcher, broadcasting/film/video in the Last Year: Not on file  . Ran Out of Food in the Last Year: Not on file  Transportation Needs:   . Lack of Transportation (Medical): Not on file  . Lack of Transportation (Non-Medical): Not on file  Physical Activity:   . Days of Exercise per Week: Not on file  . Minutes  of Exercise per Session: Not on file  Stress:   . Feeling of Stress : Not on file  Social Connections:   . Frequency of Communication with Friends and Family: Not on file  . Frequency of Social Gatherings with Friends and Family: Not on file  . Attends Religious Services: Not on file  . Active Member of Clubs or Organizations: Not on file  . Attends Banker Meetings: Not on file  . Marital Status: Not on file  Intimate Partner Violence:   . Fear of Current or Ex-Partner: Not on file  . Emotionally Abused: Not on file  . Physically Abused: Not on file  . Sexually Abused: Not on file      Review of Systems  Constitutional: Negative for chills, fatigue and unexpected weight change.   HENT: Negative for congestion, rhinorrhea, sneezing and sore throat.   Eyes: Negative for photophobia, pain and redness.  Respiratory: Negative for cough, chest tightness and shortness of breath.   Cardiovascular: Negative for chest pain and palpitations.  Gastrointestinal: Negative for abdominal pain, constipation, diarrhea, nausea and vomiting.  Endocrine: Negative.   Genitourinary: Negative for dysuria and frequency.  Musculoskeletal: Negative for arthralgias, back pain, joint swelling and neck pain.  Skin: Negative for rash.  Allergic/Immunologic: Negative.   Neurological: Negative for tremors and numbness.  Hematological: Negative for adenopathy. Does not bruise/bleed easily.  Psychiatric/Behavioral: Negative for behavioral problems and sleep disturbance. The patient is not nervous/anxious.     Vital Signs: BP 124/81   Pulse 61   Temp (!) 95.3 F (35.2 C)   Resp 16   Ht 5\' 8"  (1.727 m)   Wt 193 lb 3.2 oz (87.6 kg)   SpO2 97%   BMI 29.38 kg/m    Physical Exam Vitals and nursing note reviewed.  Constitutional:      General: She is not in acute distress.    Appearance: She is well-developed. She is not diaphoretic.  HENT:     Head: Normocephalic and atraumatic.     Mouth/Throat:     Pharynx: No oropharyngeal exudate.  Eyes:     Pupils: Pupils are equal, round, and reactive to light.  Neck:     Thyroid: No thyromegaly.     Vascular: No JVD.     Trachea: No tracheal deviation.  Cardiovascular:     Rate and Rhythm: Normal rate and regular rhythm.     Heart sounds: Normal heart sounds. No murmur. No friction rub. No gallop.   Pulmonary:     Effort: Pulmonary effort is normal. No respiratory distress.     Breath sounds: Normal breath sounds. No wheezing or rales.  Chest:     Chest wall: No tenderness.  Abdominal:     Palpations: Abdomen is soft.     Tenderness: There is no abdominal tenderness. There is no guarding.  Musculoskeletal:        General: Normal range  of motion.     Cervical back: Normal range of motion and neck supple.  Lymphadenopathy:     Cervical: No cervical adenopathy.  Skin:    General: Skin is warm and dry.  Neurological:     Mental Status: She is alert and oriented to person, place, and time.     Cranial Nerves: No cranial nerve deficit.  Psychiatric:        Behavior: Behavior normal.        Thought Content: Thought content normal.        Judgment: Judgment normal.  Assessment/Plan: 1. Gastroesophageal reflux disease without esophagitis Continue omeprazole as prescribed.   2. Transgender On estrogen and spirolactone.   3. Tobacco dependence due to cigarettes Smoking cessation counseling: 1. Pt acknowledges the risks of long term smoking, she will try to quite smoking. 2. Options for different medications including nicotine products, chewing gum, patch etc, Wellbutrin and Chantix is discussed 3. Goal and date of compete cessation is discussed 4. Total time spent in smoking cessation is 15 min.  4. Chronic obstructive pulmonary disease, unspecified COPD type ([place]) Stable, unfortunately continues to smoke.   5. Nocturnal hypoxia continue to use oxygen 2 lpm at night.   6. Oxygen dependent Continue with oxygen.   General Counseling: bravlio luca understanding of the findings of todays visit and agrees with plan of treatment. I have discussed any further diagnostic evaluation that may be needed or ordered today. We also reviewed her medications today. she has been encouraged to call the office with any questions or concerns that should arise related to todays visit.    No orders of the defined types were placed in this encounter.   No orders of the defined types were placed in this encounter.   Time spent: 25 Minutes   This patient was seen by Orson Gear AGNP-C in Collaboration with Dr Lavera Guise as a part of collaborative care agreement     Kendell Bane AGNP-C Internal medicine

## 2019-02-28 ENCOUNTER — Telehealth: Payer: Self-pay

## 2019-02-28 NOTE — Telephone Encounter (Signed)
Called confirmed appointment on 03/04/2019 and screened for covid. klh 

## 2019-03-04 ENCOUNTER — Ambulatory Visit: Payer: Medicaid Other | Admitting: Internal Medicine

## 2019-03-06 ENCOUNTER — Telehealth: Payer: Self-pay

## 2019-03-06 NOTE — Telephone Encounter (Signed)
Confirmed appointment on 03/11/2019 and screened for covid. klh 

## 2019-03-11 ENCOUNTER — Other Ambulatory Visit: Payer: Self-pay

## 2019-03-11 ENCOUNTER — Ambulatory Visit: Payer: Medicaid Other | Admitting: Internal Medicine

## 2019-03-11 ENCOUNTER — Encounter: Payer: Self-pay | Admitting: Internal Medicine

## 2019-03-11 VITALS — BP 111/69 | HR 74 | Temp 97.1°F | Resp 16 | Ht 68.0 in | Wt 193.0 lb

## 2019-03-11 DIAGNOSIS — Z9981 Dependence on supplemental oxygen: Secondary | ICD-10-CM | POA: Diagnosis not present

## 2019-03-11 DIAGNOSIS — F17200 Nicotine dependence, unspecified, uncomplicated: Secondary | ICD-10-CM

## 2019-03-11 DIAGNOSIS — G4734 Idiopathic sleep related nonobstructive alveolar hypoventilation: Secondary | ICD-10-CM

## 2019-03-11 DIAGNOSIS — K219 Gastro-esophageal reflux disease without esophagitis: Secondary | ICD-10-CM | POA: Diagnosis not present

## 2019-03-11 DIAGNOSIS — J449 Chronic obstructive pulmonary disease, unspecified: Secondary | ICD-10-CM

## 2019-03-11 NOTE — Progress Notes (Signed)
[facility] [address] [place], [place] [address]  Pulmonary Sleep Medicine   Office Visit Note  Patient Name: Clifford Burgess DOB: [birth year] MRN [id number]  Date of Service: 03/11/2019  Complaints/HPI: Pt is here for pulmonary follow up. She is currently treated for COPD, and nocturnal hypoxia.  She uses oxygen 2 LPM at night for hypoxia.  She has albuterol inhaler and nebulizer.  She rarely uses nebulizer, but has used it some. She denies any sob, or productive cough at this time.  Unfortunately, continues to smoke.   ROS  General: (-) fever, (-) chills, (-) night sweats, (-) weakness Skin: (-) rashes, (-) itching,. Eyes: (-) visual changes, (-) redness, (-) itching. Nose and Sinuses: (-) nasal stuffiness or itchiness, (-) postnasal drip, (-) nosebleeds, (-) sinus trouble. Mouth and Throat: (-) sore throat, (-) hoarseness. Neck: (-) swollen glands, (-) enlarged thyroid, (-) neck pain. Respiratory: - cough, (-) bloody sputum, - shortness of breath, - wheezing. Cardiovascular: - ankle swelling, (-) chest pain. Lymphatic: (-) lymph node enlargement. Neurologic: (-) numbness, (-) tingling. Psychiatric: (-) anxiety, (-) depression   Current Medication: Outpatient Encounter Medications as of 03/11/2019  Medication Sig  . albuterol (PROVENTIL) (2.5 MG/3ML) 0.083% nebulizer solution Take 3 mLs (2.5 mg total) by nebulization every 6 (six) hours as needed for wheezing or shortness of breath.  Marland Kitchen albuterol (VENTOLIN HFA) 108 (90 Base) MCG/ACT inhaler Inhale 2 puffs into the lungs every 6 (six) hours as needed for wheezing or shortness of breath.  Marland Kitchen aspirin EC 81 MG tablet Take 81 mg by mouth daily.  Marland Kitchen estradiol (ESTRACE) 2 MG tablet Take 2 mg by mouth 2 (two) times daily.   Marland Kitchen omeprazole (PRILOSEC) 20 MG capsule Take 1 capsule (20 mg total) by mouth daily.  . OXYGEN Inhale 2 L into the lungs. At night  . spironolactone (ALDACTONE) 50 MG tablet Take 50 mg by mouth once.    No facility-administered encounter medications on file as of 03/11/2019.    Surgical History: Past Surgical History:  Procedure Laterality Date  . HERNIA REPAIR      Medical History: Past Medical History:  Diagnosis Date  . Asthma   . COPD (chronic obstructive pulmonary disease) (HCC)   . GERD (gastroesophageal reflux disease)     Family History: Family History  Problem Relation Age of Onset  . Hypertension Father     Social History: Social History   Socioeconomic History  . Marital status: Divorced    Spouse name: Not on file  . Number of children: 2  . Years of education: Not on file  . Highest education level: 8th grade  Occupational History  . Not on file  Tobacco Use  . Smoking status: Current Every Day Smoker    Packs/day: 0.50    Types: Cigarettes  . Smokeless tobacco: Current User    Types: Chew  Substance and Sexual Activity  . Alcohol use: Yes    Alcohol/week: 2.0 standard drinks    Types: 2 Cans of beer per week  . Drug use: Not Currently    Types: Cocaine, Marijuana, Heroin, LSD, "Crack" cocaine  . Sexual activity: Not Currently  Other Topics Concern  . Not on file  Social History Narrative  . Not on file   Social Determinants of Health   Financial Resource Strain:   . Difficulty of Paying Living Expenses: Not on file  Food Insecurity:   . Worried About Programme researcher, broadcasting/film/video in the Last Year: Not on  file  . [place] in the Last Year: Not on file  Transportation Needs:   . Lack of Transportation (Medical): Not on file  . Lack of Transportation (Non-Medical): Not on file  Physical Activity:   . Days of Exercise per Week: Not on file  . Minutes of Exercise per Session: Not on file  Stress:   . Feeling of Stress : Not on file  Social Connections:   . Frequency of Communication with Friends and Family: Not on file  . Frequency of Social Gatherings with Friends and Family: Not on file  . Attends Religious Services: Not on file  .  Active Member of Clubs or Organizations: Not on file  . Attends Archivist Meetings: Not on file  . Marital Status: Not on file  Intimate Partner Violence:   . Fear of Current or Ex-Partner: Not on file  . Emotionally Abused: Not on file  . Physically Abused: Not on file  . Sexually Abused: Not on file    Vital Signs: Blood pressure 111/69, pulse 74, temperature (!) 97.1 F (36.2 C), resp. rate 16, height 5\' 8"  (1.727 m), weight 193 lb (87.5 kg), SpO2 96 %.  Examination: General Appearance: The patient is well-developed, well-nourished, and in no distress. Skin: Gross inspection of skin unremarkable. Head: normocephalic, no gross deformities. Eyes: no gross deformities noted. ENT: ears appear grossly normal no exudates. Neck: Supple. No thyromegaly. No LAD. Respiratory: clear bilaterally. Cardiovascular: Normal S1 and S2 without murmur or rub. Extremities: No cyanosis. pulses are equal. Neurologic: Alert and oriented. No involuntary movements.  LABS: Recent Results (from the past 2160 hour(s))  PSA     Status: None   Collection Time: 02/17/19 11:13 AM  Result Value Ref Range   Prostatic Specific Antigen 1.10 0.00 - 4.00 ng/mL    Comment: (NOTE) While PSA levels of <=4.0 ng/ml are reported as reference range, some men with levels below 4.0 ng/ml can have prostate cancer and many men with PSA above 4.0 ng/ml do not have prostate cancer.  Other tests such as free PSA, age specific reference ranges, PSA velocity and PSA doubling time may be helpful especially in men less than 52 years old. Performed at [facility], [place] [address], [place], [place] [address]   Comprehensive metabolic panel     Status: None   Collection Time: 02/17/19 11:13 AM  Result Value Ref Range   Sodium 135 135 - 145 mmol/L   Potassium 4.0 3.5 - 5.1 mmol/L   Chloride 99 98 - 111 mmol/L   CO2 29 22 - 32 mmol/L   Glucose, Bld 87 70 - 99 mg/dL   BUN 13 6 - 20 mg/dL   Creatinine, Ser  0.89 0.61 - 1.24 mg/dL   Calcium 9.0 8.9 - 10.3 mg/dL   Total Protein 7.1 6.5 - 8.1 g/dL   Albumin 3.8 3.5 - 5.0 g/dL   AST 15 15 - 41 U/L   ALT 13 0 - 44 U/L   Alkaline Phosphatase 52 38 - 126 U/L   Total Bilirubin 0.4 0.3 - 1.2 mg/dL   GFR calc non Af Amer >60 >60 mL/min   GFR calc Af Amer >60 >60 mL/min   Anion gap 7 5 - 15    Comment: Performed at [facility], [address], [place], [place] [address]  T4, free     Status: None   Collection Time: 02/17/19 11:13 AM  Result Value Ref Range  Free T4 0.77 0.61 - 1.12 ng/dL    Comment: (NOTE) Biotin ingestion may interfere with free T4 tests. If the results are inconsistent with the TSH level, previous test results, or the clinical presentation, then consider biotin interference. If needed, order repeat testing after stopping biotin. Performed at [facility], [address] [place], [place] [address]   TSH     Status: None   Collection Time: 02/17/19 11:13 AM  Result Value Ref Range   TSH 1.695 0.350 - 4.500 uIU/mL    Comment: Performed by a 3rd Generation assay with a functional sensitivity of <=0.01 uIU/mL. Performed at [facility], [address] [place], [place] [address]   Lipid panel     Status: Abnormal   Collection Time: 02/17/19 11:13 AM  Result Value Ref Range   Cholesterol 195 0 - 200 mg/dL   Triglycerides 767 <341 mg/dL   HDL 48 >93 mg/dL   Total CHOL/HDL Ratio 4.1 RATIO   VLDL 27 0 - 40 mg/dL   LDL Cholesterol 790 (H) 0 - 99 mg/dL    Comment:        Total Cholesterol/HDL:CHD Risk Coronary Heart Disease Risk Table                     Men   Women  1/2 Average Risk   3.4   3.3  Average Risk       5.0   4.4  2 X Average Risk   9.6   7.1  3 X Average Risk  23.4   11.0        Use the calculated Patient Ratio above and the CHD Risk Table to determine the patient's CHD Risk.        ATP III CLASSIFICATION (LDL):  <100     mg/dL   Optimal  240-973  mg/dL   Near or Above                     Optimal  130-159  mg/dL   Borderline  532-992  mg/dL   High  >426     mg/dL   Very High Performed at [facility], [address] [place], [place] [address]   CBC with Differential/Platelet     Status: None   Collection Time: 02/17/19 11:13 AM  Result Value Ref Range   WBC 6.3 4.0 - 10.5 K/uL   RBC 4.82 4.22 - 5.81 MIL/uL   Hemoglobin 14.3 13.0 - 17.0 g/dL   HCT 62.2 29.7 - 98.9 %   MCV 92.1 80.0 - 100.0 fL   MCH 29.7 26.0 - 34.0 pg   MCHC 32.2 30.0 - 36.0 g/dL   RDW 21.1 94.1 - 74.0 %   Platelets 382 150 - 400 K/uL   nRBC 0.0 0.0 - 0.2 %   Neutrophils Relative % 57 %   Neutro Abs 3.6 1.7 - 7.7 K/uL   Lymphocytes Relative 30 %   Lymphs Abs 1.9 0.7 - 4.0 K/uL   Monocytes Relative 10 %   Monocytes Absolute 0.7 0.1 - 1.0 K/uL   Eosinophils Relative 2 %   Eosinophils Absolute 0.1 0.0 - 0.5 K/uL   Basophils Relative 1 %   Basophils Absolute 0.0 0.0 - 0.1 K/uL   Immature Granulocytes 0 %   Abs Immature Granulocytes 0.01 0.00 - 0.07 K/uL    Comment: Performed at [facility], [address], [place], [place] [address]    Radiology: No  results found.  No results found.  No results found.    Assessment and Plan: Patient Active Problem List   Diagnosis Date Noted  . 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 MDD (major depressive disorder), recurrent, in partial remission (HCC) 07/09/2018  . Alcohol use disorder, moderate, dependence (HCC) 07/09/2018  . Tobacco use disorder 07/09/2018  . Bipolar 1 disorder, depressed, severe (HCC) 01/25/2018  . Drug overdose 01/23/2018  . COPD exacerbation (HCC) 11/27/2017  . GERD (gastroesophageal reflux disease) 11/27/2017  . 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 Smoker 10/15/2017  . SOB (shortness of breath) 10/15/2017  . Alcohol abuse 10/05/2017  . Drug abuse (HCC) 10/05/2017    1. Chronic obstructive pulmonary disease, unspecified COPD type (HCC) Good symptom  control.  Continue to use inhaler and nebulizer as directed.   2. Nocturnal hypoxia Continue with oxygen 2 LPM at night.   3. Oxygen dependent Continue to use oxygen as prescribed.   4. Gastroesophageal reflux disease without esophagitis Stable, continue current medication.   5. Nicotine dependence with current use Smoking cessation counseling: 1. Pt acknowledges the risks of long term smoking, she will try to quite smoking. 2. Options for different medications including nicotine products, chewing gum, patch etc, Wellbutrin and Chantix is discussed 3. Goal and date of compete cessation is discussed 4. Total time spent in smoking cessation is 15 min.   General Counseling: I have discussed the findings of the evaluation and examination with Ramon Dredge.  I have also discussed any further diagnostic evaluation thatmay be needed or ordered today. Emanuelle verbalizes understanding of the findings of todays visit. We also reviewed her medications today and discussed drug interactions and side effects including but not limited excessive drowsiness and altered mental states. We also discussed that there is always a risk not just to her but also people around her. she has been encouraged to call the office with any questions or concerns that should arise related to todays visit.  No orders of the defined types were placed in this encounter.    Time spent: 25 This patient was seen by Blima Ledger AGNP-C in Collaboration with Dr. Freda Munro as a part of collaborative care agreement.   I have personally obtained a history, examined the patient, evaluated laboratory and imaging results, formulated the assessment and plan and placed orders.    Yevonne Pax, MD [facility] Pulmonary and Critical Care Sleep medicine

## 2019-03-14 ENCOUNTER — Telehealth: Payer: Self-pay

## 2019-03-14 NOTE — Telephone Encounter (Signed)
CMN SIGNED AND PLACED IN [employer] PATIENT FOLDER. °

## 2019-05-29 ENCOUNTER — Emergency Department
Admission: EM | Admit: 2019-05-29 | Discharge: 2019-05-29 | Disposition: A | Discharge status: Home / Self Care | Payer: Medicaid Other | Attending: Emergency Medicine | Admitting: Emergency Medicine

## 2019-05-29 ENCOUNTER — Other Ambulatory Visit: Payer: Self-pay

## 2019-05-29 ENCOUNTER — Emergency Department: Payer: Medicaid Other

## 2019-05-29 ENCOUNTER — Encounter: Payer: Self-pay | Admitting: Emergency Medicine

## 2019-05-29 DIAGNOSIS — Y999 Unspecified external cause status: Secondary | ICD-10-CM | POA: Diagnosis not present

## 2019-05-29 DIAGNOSIS — Z7982 Long term (current) use of aspirin: Secondary | ICD-10-CM | POA: Diagnosis not present

## 2019-05-29 DIAGNOSIS — S92255A Nondisplaced fracture of navicular [scaphoid] of left foot, initial encounter for closed fracture: Secondary | ICD-10-CM | POA: Insufficient documentation

## 2019-05-29 DIAGNOSIS — Y929 Unspecified place or not applicable: Secondary | ICD-10-CM | POA: Diagnosis not present

## 2019-05-29 DIAGNOSIS — S9032XA Contusion of left foot, initial encounter: Secondary | ICD-10-CM

## 2019-05-29 DIAGNOSIS — F17210 Nicotine dependence, cigarettes, uncomplicated: Secondary | ICD-10-CM | POA: Diagnosis not present

## 2019-05-29 DIAGNOSIS — W28XXXA Contact with powered lawn mower, initial encounter: Secondary | ICD-10-CM | POA: Diagnosis not present

## 2019-05-29 DIAGNOSIS — J449 Chronic obstructive pulmonary disease, unspecified: Secondary | ICD-10-CM | POA: Diagnosis not present

## 2019-05-29 DIAGNOSIS — S82892A Other fracture of left lower leg, initial encounter for closed fracture: Secondary | ICD-10-CM

## 2019-05-29 DIAGNOSIS — S99912A Unspecified injury of left ankle, initial encounter: Secondary | ICD-10-CM | POA: Diagnosis present

## 2019-05-29 DIAGNOSIS — Y939 Activity, unspecified: Secondary | ICD-10-CM | POA: Insufficient documentation

## 2019-05-29 DIAGNOSIS — Z79899 Other long term (current) drug therapy: Secondary | ICD-10-CM | POA: Insufficient documentation

## 2019-05-29 DIAGNOSIS — S8265XA Nondisplaced fracture of lateral malleolus of left fibula, initial encounter for closed fracture: Secondary | ICD-10-CM | POA: Insufficient documentation

## 2019-05-29 MED ORDER — NAPROXEN 500 MG PO TABS
500.0000 mg | ORAL_TABLET | Freq: Two times a day (BID) | ORAL | Status: DC
Start: 2019-05-29 — End: 2020-07-19

## 2019-05-29 MED ORDER — TRAMADOL HCL 50 MG PO TABS: 50.0000 mg | ORAL_TABLET | Freq: Four times a day (QID) | ORAL | 0 refills | Status: AC | PRN

## 2019-05-29 MED ORDER — NAPROXEN 500 MG PO TABS
500.0000 mg | ORAL_TABLET | Freq: Once | ORAL | Status: AC
  Administered 2019-05-29: 500 mg via ORAL
  Filled 2019-05-29: qty 1

## 2019-05-29 NOTE — ED Triage Notes (Signed)
Pt reports the wheel of a lawnmower ran over his left foot hurting his left ankle. Pt reports broke that same ankle years ago.

## 2019-05-29 NOTE — ED Provider Notes (Addendum)
[facility] Emergency Department Provider Note   ____________________________________________   First MD Initiated Contact with Patient 05/29/19 [phone]     (approximate)  I have reviewed the triage vital signs and the nursing notes.   HISTORY  Chief Complaint Ankle Pain    HPI Clifford Burgess is a 49 y.o. adult patient complain of left foot and ankle pain secondary to being run over by the wheel of a lawnmower.  Patient state pain with weightbearing but ambulates without support.  Prior history of left ankle fracture years ago.  Patient was able to ride a motorcycle to this facility.  Patient rates pain as 8/10.  Patient described pain is "achy".  No palliative measure for complaint.         Past Medical History:  Diagnosis Date  . Asthma   . COPD (chronic obstructive pulmonary disease) ([place])   . GERD (gastroesophageal reflux disease)     Patient Active Problem List   Diagnosis Date Noted  . 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 MDD (major depressive disorder), recurrent, in partial remission ([place]) 07/09/2018  . Alcohol use disorder, moderate, dependence ([place]) 07/09/2018  . Tobacco use disorder 07/09/2018  . Bipolar 1 disorder, depressed, severe ([place]) 01/25/2018  . Drug overdose 01/23/2018  . COPD exacerbation ([place]) 11/27/2017  . GERD (gastroesophageal reflux disease) 11/27/2017  . 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 Smoker 10/15/2017  . SOB (shortness of breath) 10/15/2017  . Alcohol abuse 10/05/2017  . Drug abuse ([place]) 10/05/2017    Past Surgical History:  Procedure Laterality Date  . HERNIA REPAIR      Prior to Admission medications   Medication Sig Start Date End Date Taking? Authorizing Provider  albuterol (PROVENTIL) (2.5 MG/3ML) 0.083% nebulizer solution Take 3 mLs (2.5 mg total) by nebulization every 6 (six) hours as needed for wheezing or shortness of breath. 01/13/19   Kendell Bane, NP   albuterol (VENTOLIN HFA) 108 (90 Base) MCG/ACT inhaler Inhale 2 puffs into the lungs every 6 (six) hours as needed for wheezing or shortness of breath. 01/13/19   Kendell Bane, NP  aspirin EC 81 MG tablet Take 81 mg by mouth daily. 01/09/19   [provider]  estradiol (ESTRACE) 2 MG tablet Take 2 mg by mouth 2 (two) times daily.  01/09/19   [provider]  naproxen (NAPROSYN) 500 MG tablet Take 1 tablet (500 mg total) by mouth 2 (two) times daily with a meal. 05/29/19   Sable Feil, PA-C  omeprazole (PRILOSEC) 20 MG capsule Take 1 capsule (20 mg total) by mouth daily. 01/13/19   Kendell Bane, NP  OXYGEN Inhale 2 L into the lungs. At night    [provider]  spironolactone (ALDACTONE) 50 MG tablet Take 50 mg by mouth once.    [provider]  traMADol (ULTRAM) 50 MG tablet Take 1 tablet (50 mg total) by mouth every 6 (six) hours as needed. 05/29/19 05/28/20  Sable Feil, PA-C    Allergies Chocolate  Family History  Problem Relation Age of Onset  . Hypertension Father     Social History Social History   Tobacco Use  . Smoking status: Current Every Day Smoker    Packs/day: 0.50    Types: Cigarettes  . Smokeless tobacco: Current User    Types: Chew  Substance Use Topics  . Alcohol use: Yes    Alcohol/week: 2.0 standard drinks    Types: 2  Cans of beer per week  . Drug use: Not Currently    Types: Cocaine, Marijuana, Heroin, LSD, "Crack" cocaine    Review of Systems  Constitutional: No fever/chills Eyes: No visual changes. ENT: No sore throat. Cardiovascular: Denies chest pain. Respiratory: Denies shortness of breath. Gastrointestinal: No abdominal pain.  No nausea, no vomiting.  No diarrhea.  No constipation. Genitourinary: Negative for dysuria. Musculoskeletal: Left foot ankle pain.   Skin: Negative for rash. Neurological: Negative for headaches, focal weakness or numbness. Psychiatric:  Alcohol abuse, bipolar, and  depression. Allergic/Immunilogical: Chocolate ____________________________________________   PHYSICAL EXAM:  VITAL SIGNS: ED Triage Vitals  Enc Vitals Group     BP 05/29/19 0936 124/64     Pulse Rate 05/29/19 0936 78     Resp 05/29/19 0936 20     Temp 05/29/19 0936 98.2 F (36.8 C)     Temp Source 05/29/19 0936 Oral     SpO2 05/29/19 0936 96 %     Weight 05/29/19 0931 200 lb (90.7 kg)     Height 05/29/19 0931 5\' 8"  (1.727 m)     Head Circumference --      Peak Flow --      Pain Score 05/29/19 0931 8     Pain Loc --      Pain Edu? --      Excl. in GC? --     Constitutional: Alert and oriented. Well appearing and in no acute distress. Cardiovascular: Normal rate, regular rhythm. Grossly normal heart sounds.  Good peripheral circulation. Respiratory: Normal respiratory effort.  No retractions. Lungs CTAB. Genitourinary: Deferred Musculoskeletal: No obvious deformity to the left ankle.  Mild lateral ankle edema.  Moderate guarding palpation dorsal aspect the left foot.   Neurologic:  Normal speech and language. No gross focal neurologic deficits are appreciated. No gait instability. Skin:  Skin is warm, dry and intact. No rash noted. Psychiatric: Mood and affect are normal. Speech and behavior are normal.  ____________________________________________   LABS (all labs ordered are listed, but only abnormal results are displayed)  Labs Reviewed - No data to display ____________________________________________  EKG   ____________________________________________  RADIOLOGY  ED MD interpretation:    Official radiology report(s): DG Ankle Complete Left  Result Date: 05/29/2019 CLINICAL DATA:  Posttraumatic left foot pain EXAM: LEFT ANKLE COMPLETE - 3+ VIEW COMPARISON:  None. FINDINGS: Lateral soft tissue swelling. Probable ankle joint effusion. A small nondisplaced avulsion fracture is seen at the tip of the lateral malleolus. Probable additional nondisplaced fracture of  the dorsal navicular. IMPRESSION: 1. Nondisplaced avulsion fracture at the lateral malleolus. 2. Probable small avulsion fracture at the dorsal navicular. 3. Soft tissue swelling and ankle joint effusion. Electronically Signed   By: 05/31/2019 M.D.   On: 05/29/2019 10:29    ____________________________________________   PROCEDURES  Procedure(s) performed (including Critical Care):  Procedures   ____________________________________________   INITIAL IMPRESSION / ASSESSMENT AND PLAN / ED COURSE  As part of my medical decision making, I reviewed the following data within the electronic MEDICAL RECORD NUMBER     Patient complain of right foot pain secondary to contusion by the wheels of a lawnmower.  Discussed  x-ray findings with patient.  Patient physical exam consistent with foot contusion and small avulsion fracture of the lateral malleolus anddorsal navicular..  Patient given discharge care instruction.  Patient foot was Ace wrapped.  Patient given prescription for tramadol and naproxen.  Patient advised follow-up PCP.    05/31/2019 Abdallah was  evaluated in Emergency Department on 05/29/2019 for the symptoms described in the history of present illness. She was evaluated in the context of the global COVID-19 pandemic, which necessitated consideration that the patient might be at risk for infection with the SARS-CoV-2 virus that causes COVID-19. Institutional protocols and algorithms that pertain to the evaluation of patients at risk for COVID-19 are in a state of rapid change based on information released by regulatory bodies including the CDC and federal and state organizations. These policies and algorithms were followed during the patient's care in the ED.       ____________________________________________   FINAL CLINICAL IMPRESSION(S) / ED DIAGNOSES  Final diagnoses:  Contusion of left foot, initial encounter  Avulsion fracture of ankle, left, closed, initial encounter      ED Discharge Orders         Ordered    traMADol (ULTRAM) 50 MG tablet  Every 6 hours PRN     05/29/19 1030    naproxen (NAPROSYN) 500 MG tablet  2 times daily with meals     05/29/19 1030           Note:  This document was prepared using Dragon voice recognition software and may include unintentional dictation errors.    Joni Reining, PA-C 05/29/19 1031    Joni Reining, PA-C 05/29/19 1051    Shaune Pollack, MD 05/29/19 2034

## 2019-05-29 NOTE — Discharge Instructions (Signed)
Follow discharge care instruction take medication as directed. °

## 2019-05-29 NOTE — ED Notes (Signed)
See triage note  States ran over left foot /ankle yesterday with a lawn mower  Min swelling   Good pulses  Unable to bear wt

## 2019-06-24 ENCOUNTER — Telehealth: Payer: Self-pay

## 2019-06-24 NOTE — Telephone Encounter (Signed)
Tried informing patient of appointment on 06/26/2019 no voicemail. klh  

## 2019-06-26 ENCOUNTER — Ambulatory Visit: Payer: Medicaid Other | Admitting: Adult Health

## 2019-06-26 ENCOUNTER — Encounter: Payer: Self-pay | Admitting: Adult Health

## 2019-06-26 ENCOUNTER — Other Ambulatory Visit: Payer: Self-pay

## 2019-06-26 VITALS — BP 120/76 | HR 71 | Temp 97.6°F | Resp 16 | Ht 68.0 in | Wt 193.0 lb

## 2019-06-26 DIAGNOSIS — F640 Transsexualism: Secondary | ICD-10-CM | POA: Diagnosis not present

## 2019-06-26 DIAGNOSIS — K219 Gastro-esophageal reflux disease without esophagitis: Secondary | ICD-10-CM | POA: Diagnosis not present

## 2019-06-26 DIAGNOSIS — Z789 Other specified health status: Secondary | ICD-10-CM

## 2019-06-26 DIAGNOSIS — F17200 Nicotine dependence, unspecified, uncomplicated: Secondary | ICD-10-CM | POA: Diagnosis not present

## 2019-06-26 DIAGNOSIS — Z9981 Dependence on supplemental oxygen: Secondary | ICD-10-CM

## 2019-06-26 DIAGNOSIS — J449 Chronic obstructive pulmonary disease, unspecified: Secondary | ICD-10-CM

## 2019-06-26 DIAGNOSIS — G4734 Idiopathic sleep related nonobstructive alveolar hypoventilation: Secondary | ICD-10-CM

## 2019-06-26 MED ORDER — ALBUTEROL SULFATE (2.5 MG/3ML) 0.083% IN NEBU: 2.5000 mg | INHALATION_SOLUTION | Freq: Four times a day (QID) | RESPIRATORY_TRACT | 2 refills | Status: DC | PRN

## 2019-06-26 NOTE — Progress Notes (Signed)
[facility] [place], [place] [address]  Internal MEDICINE  Office Visit Note  Patient Name: Clifford Burgess  [id number]  [id number]  Date of Service: 06/26/2019  Chief Complaint  Patient presents with  . Follow-up  . COPD  . Asthma    HPI  Pt is here for follow up on COPD, Asthma. She is doing well at this time.   She continues to use nocturnal oxygen for hypoxia.  She is taking hormones at this time prescribed by another physician.  She has no complaints or issues at this time. Unfortunately she continues to smoke 1 Ppd of cigarettes.        Current Medication: Outpatient Encounter Medications as of 06/26/2019  Medication Sig  . albuterol (PROVENTIL) (2.5 MG/3ML) 0.083% nebulizer solution Take 3 mLs (2.5 mg total) by nebulization every 6 (six) hours as needed for wheezing or shortness of breath.  Marland Kitchen albuterol (VENTOLIN HFA) 108 (90 Base) MCG/ACT inhaler Inhale 2 puffs into the lungs every 6 (six) hours as needed for wheezing or shortness of breath.  Marland Kitchen aspirin EC 81 MG tablet Take 81 mg by mouth daily.  Marland Kitchen estradiol (ESTRACE) 2 MG tablet Take 2 mg by mouth 2 (two) times daily.   . naproxen (NAPROSYN) 500 MG tablet Take 1 tablet (500 mg total) by mouth 2 (two) times daily with a meal.  . omeprazole (PRILOSEC) 20 MG capsule Take 1 capsule (20 mg total) by mouth daily.  . OXYGEN Inhale 2 L into the lungs. At night  . spironolactone (ALDACTONE) 50 MG tablet Take 50 mg by mouth once.  . traMADol (ULTRAM) 50 MG tablet Take 1 tablet (50 mg total) by mouth every 6 (six) hours as needed.  . [DISCONTINUED] albuterol (PROVENTIL) (2.5 MG/3ML) 0.083% nebulizer solution Take 3 mLs (2.5 mg total) by nebulization every 6 (six) hours as needed for wheezing or shortness of breath.   No facility-administered encounter medications on file as of 06/26/2019.    Surgical History: Past Surgical History:  Procedure Laterality Date  . HERNIA REPAIR      Medical  History: Past Medical History:  Diagnosis Date  . Asthma   . COPD (chronic obstructive pulmonary disease) ([place])   . GERD (gastroesophageal reflux disease)     Family History: Family History  Problem Relation Age of Onset  . Hypertension Father     Social History   Socioeconomic History  . Marital status: Divorced    Spouse name: Not on file  . Number of children: 2  . Years of education: Not on file  . Highest education level: 8th grade  Occupational History  . Not on file  Tobacco Use  . Smoking status: Current Every Day Smoker    Packs/day: 0.50    Types: Cigarettes  . Smokeless tobacco: Current User    Types: Chew  Vaping Use  . Vaping Use: Never used  Substance and Sexual Activity  . Alcohol use: Yes    Alcohol/week: 2.0 standard drinks    Types: 2 Cans of beer per week  . Drug use: Not Currently    Types: Cocaine, Marijuana, Heroin, LSD, "Crack" cocaine  . Sexual activity: Not Currently  Other Topics Concern  . Not on file  Social History Narrative  . Not on file   Social Determinants of Health   Financial Resource Strain:   . Difficulty of Paying Living Expenses:   Food Insecurity:   . Worried About Charity fundraiser in the  Last Year:   . Ran Out of Food in the Last Year:   Transportation Needs:   . Freight forwarder (Medical):   Marland Kitchen Lack of Transportation (Non-Medical):   Physical Activity:   . Days of Exercise per Week:   . Minutes of Exercise per Session:   Stress:   . Feeling of Stress :   Social Connections:   . Frequency of Communication with Friends and Family:   . Frequency of Social Gatherings with Friends and Family:   . Attends Religious Services:   . Active Member of Clubs or Organizations:   . Attends Banker Meetings:   Marland Kitchen Marital Status:   Intimate Partner Violence:   . Fear of Current or Ex-Partner:   . Emotionally Abused:   Marland Kitchen Physically Abused:   . Sexually Abused:       Review of Systems   Constitutional: Negative for chills, fatigue and unexpected weight change.  HENT: Negative for congestion, rhinorrhea, sneezing and sore throat.   Eyes: Negative for photophobia, pain and redness.  Respiratory: Negative for cough, chest tightness and shortness of breath.   Cardiovascular: Negative for chest pain and palpitations.  Gastrointestinal: Negative for abdominal pain, constipation, diarrhea, nausea and vomiting.  Endocrine: Negative.   Genitourinary: Negative for dysuria and frequency.  Musculoskeletal: Negative for arthralgias, back pain, joint swelling and neck pain.  Skin: Negative for rash.  Allergic/Immunologic: Negative.   Neurological: Negative for tremors and numbness.  Hematological: Negative for adenopathy. Does not bruise/bleed easily.  Psychiatric/Behavioral: Negative for behavioral problems and sleep disturbance. The patient is not nervous/anxious.     Vital Signs: BP 120/76   Pulse 71   Temp 97.6 F (36.4 C)   Resp 16   Ht 5\' 8"  (1.727 m)   Wt 193 lb (87.5 kg)   SpO2 93%   BMI 29.35 kg/m    Physical Exam Vitals and nursing note reviewed.  Constitutional:      General: She is not in acute distress.    Appearance: She is well-developed. She is not diaphoretic.  HENT:     Head: Normocephalic and atraumatic.     Mouth/Throat:     Pharynx: No oropharyngeal exudate.  Eyes:     Pupils: Pupils are equal, round, and reactive to light.  Neck:     Thyroid: No thyromegaly.     Vascular: No JVD.     Trachea: No tracheal deviation.  Cardiovascular:     Rate and Rhythm: Normal rate and regular rhythm.     Heart sounds: Normal heart sounds. No murmur heard.  No friction rub. No gallop.   Pulmonary:     Effort: Pulmonary effort is normal. No respiratory distress.     Breath sounds: Normal breath sounds. No wheezing or rales.  Chest:     Chest wall: No tenderness.  Abdominal:     Palpations: Abdomen is soft.     Tenderness: There is no abdominal  tenderness. There is no guarding.  Musculoskeletal:        General: Normal range of motion.     Cervical back: Normal range of motion and neck supple.  Lymphadenopathy:     Cervical: No cervical adenopathy.  Skin:    General: Skin is warm and dry.  Neurological:     Mental Status: She is alert and oriented to person, place, and time.     Cranial Nerves: No cranial nerve deficit.  Psychiatric:        Behavior: Behavior normal.  Thought Content: Thought content normal.        Judgment: Judgment normal.    Assessment/Plan: 1. Chronic obstructive pulmonary disease, unspecified COPD type (HCC) Continue to use inhalers as prescribed.    2. Gastroesophageal reflux disease without esophagitis Controlled continue current medications.   3. Nicotine dependence with current use Smoking cessation counseling: 1. Pt acknowledges the risks of long term smoking, she will try to quite smoking. 2. Options for different medications including nicotine products, chewing gum, patch etc, Wellbutrin and Chantix is discussed 3. Goal and date of compete cessation is discussed 4. Total time spent in smoking cessation is 15 min.  4. Transgender Continue hormones  As prescribed by treating physician.   5. Nocturnal hypoxia Continue to use oxygen as directed at night.   6. Oxygen dependent Continue at 2 LPM at this time.   General Counseling: Clifford Burgess understanding of the findings of todays visit and agrees with plan of treatment. I have discussed any further diagnostic evaluation that may be needed or ordered today. We also reviewed her medications today. she has been encouraged to call the office with any questions or concerns that should arise related to todays visit.    No orders of the defined types were placed in this encounter.   Meds ordered this encounter  Medications  . albuterol (PROVENTIL) (2.5 MG/3ML) 0.083% nebulizer solution    Sig: Take 3 mLs (2.5 mg total) by  nebulization every 6 (six) hours as needed for wheezing or shortness of breath.    Dispense:  150 mL    Refill:  2    Time spent: 30 Minutes   This patient was seen by Blima Ledger AGNP-C in Collaboration with Dr Lyndon Code as a part of collaborative care agreement     Clifford Burgess AGNP-C Internal medicine

## 2019-08-01 ENCOUNTER — Telehealth: Payer: Self-pay

## 2019-08-01 NOTE — Telephone Encounter (Signed)
CMN signed by provider and placed in [facility] patient folder.

## 2019-08-15 ENCOUNTER — Other Ambulatory Visit: Payer: Self-pay | Admitting: Adult Health

## 2019-08-15 DIAGNOSIS — K219 Gastro-esophageal reflux disease without esophagitis: Secondary | ICD-10-CM

## 2019-09-18 ENCOUNTER — Ambulatory Visit: Payer: Medicaid Other | Admitting: Internal Medicine

## 2019-09-18 ENCOUNTER — Encounter: Payer: Self-pay | Admitting: Internal Medicine

## 2019-09-18 ENCOUNTER — Other Ambulatory Visit: Payer: Self-pay

## 2019-09-18 DIAGNOSIS — F17219 Nicotine dependence, cigarettes, with unspecified nicotine-induced disorders: Secondary | ICD-10-CM | POA: Diagnosis not present

## 2019-09-18 DIAGNOSIS — R0602 Shortness of breath: Secondary | ICD-10-CM

## 2019-09-18 DIAGNOSIS — J449 Chronic obstructive pulmonary disease, unspecified: Secondary | ICD-10-CM | POA: Diagnosis not present

## 2019-09-18 NOTE — Progress Notes (Signed)
[facility] [address] [place], [place] [address]  Pulmonary Sleep Medicine   Office Visit Note  Patient Name: Clifford Burgess DOB: [birth year] MRN [id number]  Date of Service: 09/18/2019  Complaints/HPI:  Patient is here for routine follow-up COPD-not requiring daily inhalers at this time, says his breathing is at his baseline, no complications, rarely feels the need to use his rescue albuterol inhaler Still smoking about a pack a day, working on quitting at this time  ROS  General: (-) fever, (-) chills, (-) night sweats, (-) weakness Skin: (-) rashes, (-) itching,. Eyes: (-) visual changes, (-) redness, (-) itching. Nose and Sinuses: (-) nasal stuffiness or itchiness, (-) postnasal drip, (-) nosebleeds, (-) sinus trouble. Mouth and Throat: (-) sore throat, (-) hoarseness. Neck: (-) swollen glands, (-) enlarged thyroid, (-) neck pain. Respiratory: - cough, (-) bloody sputum, - shortness of breath, - wheezing. Cardiovascular: - ankle swelling, (-) chest pain. Lymphatic: (-) lymph node enlargement. Neurologic: (-) numbness, (-) tingling. Psychiatric: (-) anxiety, (-) depression   Current Medication: Outpatient Encounter Medications as of 09/18/2019  Medication Sig  . albuterol (PROVENTIL) (2.5 MG/3ML) 0.083% nebulizer solution Take 3 mLs (2.5 mg total) by nebulization every 6 (six) hours as needed for wheezing or shortness of breath.  Marland Kitchen albuterol (VENTOLIN HFA) 108 (90 Base) MCG/ACT inhaler Inhale 2 puffs into the lungs every 6 (six) hours as needed for wheezing or shortness of breath.  Marland Kitchen aspirin EC 81 MG tablet Take 81 mg by mouth daily.  Marland Kitchen estradiol (ESTRACE) 2 MG tablet Take 2 mg by mouth 2 (two) times daily.   . naproxen (NAPROSYN) 500 MG tablet Take 1 tablet (500 mg total) by mouth 2 (two) times daily with a meal.  . omeprazole (PRILOSEC) 20 MG capsule TAKE 1 CAPSULE BY MOUTH EVERY DAY  . OXYGEN Inhale 2 L into the lungs. At night  . spironolactone  (ALDACTONE) 50 MG tablet Take 50 mg by mouth once.  . traMADol (ULTRAM) 50 MG tablet Take 1 tablet (50 mg total) by mouth every 6 (six) hours as needed.   No facility-administered encounter medications on file as of 09/18/2019.    Surgical History: Past Surgical History:  Procedure Laterality Date  . HERNIA REPAIR      Medical History: Past Medical History:  Diagnosis Date  . Asthma   . COPD (chronic obstructive pulmonary disease) (HCC)   . GERD (gastroesophageal reflux disease)     Family History: Family History  Problem Relation Age of Onset  . Hypertension Father     Social History: Social History   Socioeconomic History  . Marital status: Divorced    Spouse name: Not on file  . Number of children: 2  . Years of education: Not on file  . Highest education level: 8th grade  Occupational History  . Not on file  Tobacco Use  . Smoking status: Current Every Day Smoker    Packs/day: 0.50    Types: Cigarettes  . Smokeless tobacco: Former Neurosurgeon    Types: Engineer, drilling  . Vaping Use: Never used  Substance and Sexual Activity  . Alcohol use: Yes    Alcohol/week: 2.0 standard drinks    Types: 2 Cans of beer per week  . Drug use: Not Currently    Types: Cocaine, Marijuana, Heroin, LSD, "Crack" cocaine  . Sexual activity: Not Currently  Other Topics Concern  . Not on file  Social History Narrative  . Not on file   Social  Determinants of Health   Financial Resource Strain:   . Difficulty of Paying Living Expenses: Not on file  Food Insecurity:   . Worried About Programme researcher, broadcasting/film/video in the Last Year: Not on file  . Ran Out of Food in the Last Year: Not on file  Transportation Needs:   . Lack of Transportation (Medical): Not on file  . Lack of Transportation (Non-Medical): Not on file  Physical Activity:   . Days of Exercise per Week: Not on file  . Minutes of Exercise per Session: Not on file  Stress:   . Feeling of Stress : Not on file  Social Connections:    . Frequency of Communication with Friends and Family: Not on file  . Frequency of Social Gatherings with Friends and Family: Not on file  . Attends Religious Services: Not on file  . Active Member of Clubs or Organizations: Not on file  . Attends Banker Meetings: Not on file  . Marital Status: Not on file  Intimate Partner Violence:   . Fear of Current or Ex-Partner: Not on file  . Emotionally Abused: Not on file  . Physically Abused: Not on file  . Sexually Abused: Not on file    Vital Signs: Blood pressure 134/63, pulse 71, temperature 98 F (36.7 C), resp. rate 16, height 5\' 8"  (1.727 m), weight 192 lb 9.6 oz (87.4 kg), SpO2 94 %.  Examination: General Appearance: The patient is well-developed, well-nourished, and in no distress. Skin: Gross inspection of skin unremarkable. Head: normocephalic, no gross deformities. Eyes: no gross deformities noted. ENT: ears appear grossly normal no exudates. Neck: Supple. No thyromegaly. No LAD. Respiratory: Clear throughout, no wheezing or rhonchi noted. Cardiovascular: Normal S1 and S2 without murmur or rub. Extremities: No cyanosis. pulses are equal. Neurologic: Alert and oriented. No involuntary movements.  LABS: No results found for this or any previous visit (from the past 2160 hour(s)).  Radiology: DG Ankle Complete Left  Result Date: 05/29/2019 CLINICAL DATA:  Posttraumatic left foot pain EXAM: LEFT ANKLE COMPLETE - 3+ VIEW COMPARISON:  None. FINDINGS: Lateral soft tissue swelling. Probable ankle joint effusion. A small nondisplaced avulsion fracture is seen at the tip of the lateral malleolus. Probable additional nondisplaced fracture of the dorsal navicular. IMPRESSION: 1. Nondisplaced avulsion fracture at the lateral malleolus. 2. Probable small avulsion fracture at the dorsal navicular. 3. Soft tissue swelling and ankle joint effusion. Electronically Signed   By: 05/31/2019 M.D.   On: 05/29/2019 10:29    Assessment and Plan: Patient Active Problem List   Diagnosis Date Noted  . 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 MDD (major depressive disorder), recurrent, in partial remission (HCC) 07/09/2018  . Alcohol use disorder, moderate, dependence (HCC) 07/09/2018  . Tobacco use disorder 07/09/2018  . Bipolar 1 disorder, depressed, severe (HCC) 01/25/2018  . Drug overdose 01/23/2018  . COPD exacerbation (HCC) 11/27/2017  . GERD (gastroesophageal reflux disease) 11/27/2017  . 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 Smoker 10/15/2017  . SOB (shortness of breath) 10/15/2017  . Alcohol abuse 10/05/2017  . Drug abuse (HCC) 10/05/2017   1. Chronic obstructive pulmonary disease, unspecified COPD type (HCC) Continue with current therapy at this time, not requiring daily inhalers at this time. Will obtain baseline PFT for future monitoring. Will adjust plan of care accordingly. - Pulmonary function test; Future - Pulmonary function test  2. Cigarette nicotine dependence with nicotine-induced disorder Currently smoking 1 pack per day, has  long standing history of smoking. Will obtain baseline CXR for monitoring. Encouraged to seriously think about smoking cessation. - DG Chest 2 View; Future - DG Chest 2 View  3. SOB (shortness of breath) Spirometry slightly decreased today, will obtain baseline PFT, may need to consider daily inhaler therapy. - Spirometry with Graph  General Counseling: I have discussed the findings of the evaluation and examination with Clifford Burgess.  I have also discussed any further diagnostic evaluation thatmay be needed or ordered today. Clifford Burgess verbalizes understanding of the findings of todays visit. We also reviewed her medications today and discussed drug interactions and side effects including but not limited excessive drowsiness and altered mental states. We also discussed that there is always a risk not just to her but also people around her. she  has been encouraged to call the office with any questions or concerns that should arise related to todays visit.  Smoking cessation counseling: 1. Pt acknowledges the risks of long term smoking, she will try to quite smoking. 2. Options for different medications including nicotine products, chewing gum, patch etc, Wellbutrin and Chantix is discussed 3. Goal and date of compete cessation is discussed 4. Total time spent in smoking cessation is 15 min.   Orders Placed This Encounter  Procedures  . Spirometry with Graph    Order Specific Question:   Where should this test be performed?    Answer:   [facility]    Order Specific Question:   Basic spirometry    Answer:   Yes     Time spent: 23  I have personally obtained a history, examined the patient, evaluated laboratory and imaging results, formulated the assessment and plan and placed orders. This patient was seen by Brent General AGNP-C in Collaboration with Dr. Freda Munro as a part of collaborative care agreement.    Yevonne Pax, MD [facility] Pulmonary and Critical Care Sleep medicine

## 2019-09-22 ENCOUNTER — Encounter: Payer: Self-pay | Admitting: Internal Medicine

## 2019-09-22 NOTE — Patient Instructions (Signed)

## 2019-10-27 ENCOUNTER — Ambulatory Visit: Payer: Medicaid Other | Admitting: Adult Health

## 2019-10-27 ENCOUNTER — Encounter: Payer: Self-pay | Admitting: Adult Health

## 2019-10-27 ENCOUNTER — Other Ambulatory Visit: Payer: Self-pay

## 2019-10-27 VITALS — BP 120/78 | HR 83 | Temp 97.1°F | Resp 16 | Ht 68.0 in | Wt 191.6 lb

## 2019-10-27 DIAGNOSIS — G4734 Idiopathic sleep related nonobstructive alveolar hypoventilation: Secondary | ICD-10-CM

## 2019-10-27 DIAGNOSIS — K219 Gastro-esophageal reflux disease without esophagitis: Secondary | ICD-10-CM

## 2019-10-27 DIAGNOSIS — J449 Chronic obstructive pulmonary disease, unspecified: Secondary | ICD-10-CM | POA: Diagnosis not present

## 2019-10-27 DIAGNOSIS — F17219 Nicotine dependence, cigarettes, with unspecified nicotine-induced disorders: Secondary | ICD-10-CM

## 2019-10-27 DIAGNOSIS — Z9981 Dependence on supplemental oxygen: Secondary | ICD-10-CM

## 2019-10-27 DIAGNOSIS — Z789 Other specified health status: Secondary | ICD-10-CM | POA: Diagnosis not present

## 2019-10-27 MED ORDER — ALBUTEROL SULFATE HFA 108 (90 BASE) MCG/ACT IN AERS: 2.0000 | INHALATION_SPRAY | Freq: Four times a day (QID) | RESPIRATORY_TRACT | 3 refills | Status: DC | PRN

## 2019-10-27 MED ORDER — ALBUTEROL SULFATE (2.5 MG/3ML) 0.083% IN NEBU: 2.5000 mg | INHALATION_SOLUTION | Freq: Four times a day (QID) | RESPIRATORY_TRACT | 2 refills | Status: DC | PRN

## 2019-10-27 NOTE — Progress Notes (Signed)
[facility] [address] [place], [place] [address]  Internal MEDICINE  Office Visit Note  Patient Name: Clifford Burgess  [id number]  [id number]  Date of Service: 10/27/2019  Chief Complaint  Patient presents with  . Follow-up    refill request  . COPD  . Asthma  . policy update form    received    HPI  Pt is here for follow up.  Overall she is doing well.  She denies any new or concerning symptoms.  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  Her copd is well controlled using albuterol nebs and inhaler as needed. She unfortunately continues to smoke about 1 PPD of cigarettes.     Current Medication: Outpatient Encounter Medications as of 10/27/2019  Medication Sig  . albuterol (PROVENTIL) (2.5 MG/3ML) 0.083% nebulizer solution Take 3 mLs (2.5 mg total) by nebulization every 6 (six) hours as needed for wheezing or shortness of breath.  Marland Kitchen albuterol (VENTOLIN HFA) 108 (90 Base) MCG/ACT inhaler Inhale 2 puffs into the lungs every 6 (six) hours as needed for wheezing or shortness of breath.  Marland Kitchen aspirin EC 81 MG tablet Take 81 mg by mouth daily.  Marland Kitchen estradiol (ESTRACE) 2 MG tablet Take 2 mg by mouth 2 (two) times daily.   . naproxen (NAPROSYN) 500 MG tablet Take 1 tablet (500 mg total) by mouth 2 (two) times daily with a meal.  . omeprazole (PRILOSEC) 20 MG capsule TAKE 1 CAPSULE BY MOUTH EVERY DAY  . OXYGEN Inhale 2 L into the lungs. At night  . spironolactone (ALDACTONE) 50 MG tablet Take 50 mg by mouth once.  . traMADol (ULTRAM) 50 MG tablet Take 1 tablet (50 mg total) by mouth every 6 (six) hours as needed.  . [DISCONTINUED] albuterol (PROVENTIL) (2.5 MG/3ML) 0.083% nebulizer solution Take 3 mLs (2.5 mg total) by nebulization every 6 (six) hours as needed for wheezing or shortness of breath.  . [DISCONTINUED] albuterol (VENTOLIN HFA) 108 (90 Base) MCG/ACT inhaler Inhale 2 puffs into the lungs every 6 (six) hours as needed for wheezing or  shortness of breath.   No facility-administered encounter medications on file as of 10/27/2019.    Surgical History: Past Surgical History:  Procedure Laterality Date  . HERNIA REPAIR      Medical History: Past Medical History:  Diagnosis Date  . Asthma   . COPD (chronic obstructive pulmonary disease) (HCC)   . GERD (gastroesophageal reflux disease)     Family History: Family History  Problem Relation Age of Onset  . Hypertension Father     Social History   Socioeconomic History  . Marital status: Divorced    Spouse name: Not on file  . Number of children: 2  . Years of education: Not on file  . Highest education level: 8th grade  Occupational History  . Not on file  Tobacco Use  . Smoking status: Current Every Day Smoker    Packs/day: 0.50    Types: Cigarettes  . Smokeless tobacco: Former Neurosurgeon    Types: Engineer, drilling  . Vaping Use: Never used  Substance and Sexual Activity  . Alcohol use: Yes    Alcohol/week: 2.0 standard drinks    Types: 2 Cans of beer per week  . Drug use: Not Currently    Types: Cocaine, Marijuana, Heroin, LSD, "Crack" cocaine  . Sexual activity: Not Currently  Other Topics Concern  . Not on file  Social History Narrative  .  Not on file   Social Determinants of Health   Financial Resource Strain:   . Difficulty of Paying Living Expenses: Not on file  Food Insecurity:   . Worried About Programme researcher, broadcasting/film/video in the Last Year: Not on file  . Ran Out of Food in the Last Year: Not on file  Transportation Needs:   . Lack of Transportation (Medical): Not on file  . Lack of Transportation (Non-Medical): Not on file  Physical Activity:   . Days of Exercise per Week: Not on file  . Minutes of Exercise per Session: Not on file  Stress:   . Feeling of Stress : Not on file  Social Connections:   . Frequency of Communication with Friends and Family: Not on file  . Frequency of Social Gatherings with Friends and Family: Not on file  .  Attends Religious Services: Not on file  . Active Member of Clubs or Organizations: Not on file  . Attends Banker Meetings: Not on file  . Marital Status: Not on file  Intimate Partner Violence:   . Fear of Current or Ex-Partner: Not on file  . Emotionally Abused: Not on file  . Physically Abused: Not on file  . Sexually Abused: Not on file      Review of Systems  Constitutional: Negative for chills, fatigue and unexpected weight change.  HENT: Negative for congestion, rhinorrhea, sneezing and sore throat.   Eyes: Negative for photophobia, pain and redness.  Respiratory: Negative for cough, chest tightness and shortness of breath.   Cardiovascular: Negative for chest pain and palpitations.  Gastrointestinal: Negative for abdominal pain, constipation, diarrhea, nausea and vomiting.  Endocrine: Negative.   Genitourinary: Negative for dysuria and frequency.  Musculoskeletal: Negative for arthralgias, back pain, joint swelling and neck pain.  Skin: Negative for rash.  Allergic/Immunologic: Negative.   Neurological: Negative for tremors and numbness.  Hematological: Negative for adenopathy. Does not bruise/bleed easily.  Psychiatric/Behavioral: Negative for behavioral problems and sleep disturbance. The patient is not nervous/anxious.     Vital Signs: BP 120/78   Pulse 83   Temp (!) 97.1 F (36.2 C)   Resp 16   Ht 5\' 8"  (1.727 m)   Wt 191 lb 9.6 oz (86.9 kg)   SpO2 98%   BMI 29.13 kg/m    Physical Exam Vitals and nursing note reviewed.  Constitutional:      General: She is not in acute distress.    Appearance: She is well-developed. She is not diaphoretic.  HENT:     Head: Normocephalic and atraumatic.     Mouth/Throat:     Pharynx: No oropharyngeal exudate.  Eyes:     Pupils: Pupils are equal, round, and reactive to light.  Neck:     Thyroid: No thyromegaly.     Vascular: No JVD.     Trachea: No tracheal deviation.  Cardiovascular:     Rate and  Rhythm: Normal rate and regular rhythm.     Heart sounds: Normal heart sounds. No murmur heard.  No friction rub. No gallop.   Pulmonary:     Effort: Pulmonary effort is normal. No respiratory distress.     Breath sounds: Normal breath sounds. No wheezing or rales.  Chest:     Chest wall: No tenderness.  Abdominal:     Palpations: Abdomen is soft.     Tenderness: There is no abdominal tenderness. There is no guarding.  Musculoskeletal:        General: Normal range  of motion.     Cervical back: Normal range of motion and neck supple.  Lymphadenopathy:     Cervical: No cervical adenopathy.  Skin:    General: Skin is warm and dry.  Neurological:     Mental Status: She is alert and oriented to person, place, and time.     Cranial Nerves: No cranial nerve deficit.  Psychiatric:        Behavior: Behavior normal.        Thought Content: Thought content normal.        Judgment: Judgment normal.    Assessment/Plan: 1. Chronic obstructive pulmonary disease, unspecified COPD type (HCC) Stable, Once again discussed importance of Covid vaccines, patient declines.  Continue to use albuterol as prescribed.  - albuterol (VENTOLIN HFA) 108 (90 Base) MCG/ACT inhaler; Inhale 2 puffs into the lungs every 6 (six) hours as needed for wheezing or shortness of breath.  Dispense: 18 g; Refill: 3 - albuterol (PROVENTIL) (2.5 MG/3ML) 0.083% nebulizer solution; Take 3 mLs (2.5 mg total) by nebulization every 6 (six) hours as needed for wheezing or shortness of breath.  Dispense: 150 mL; Refill: 2  2. Gastroesophageal reflux disease without esophagitis Denies any recent issues. Continue as before.   3. Transgender Continue to take hormones as prescribed by provider.   4. Nocturnal hypoxia Patient continues to use oxygen when sleeping.   5. Dependence on nocturnal oxygen therapy Continue to use oxygen nightly as prescribed.   6. Cigarette nicotine dependence with nicotine-induced disorder Smoking  cessation counseling: 1. Pt acknowledges the risks of long term smoking, she will try to quite smoking. 2. Options for different medications including nicotine products, chewing gum, patch etc, Wellbutrin and Chantix is discussed 3. Goal and date of compete cessation is discussed 4. Total time spent in smoking cessation is 15 min.   General Counseling: Clifford Burgess understanding of the findings of todays visit and agrees with plan of treatment. I have discussed any further diagnostic evaluation that may be needed or ordered today. We also reviewed her medications today. she has been encouraged to call the office with any questions or concerns that should arise related to todays visit.    No orders of the defined types were placed in this encounter.   Meds ordered this encounter  Medications  . albuterol (VENTOLIN HFA) 108 (90 Base) MCG/ACT inhaler    Sig: Inhale 2 puffs into the lungs every 6 (six) hours as needed for wheezing or shortness of breath.    Dispense:  18 g    Refill:  3  . albuterol (PROVENTIL) (2.5 MG/3ML) 0.083% nebulizer solution    Sig: Take 3 mLs (2.5 mg total) by nebulization every 6 (six) hours as needed for wheezing or shortness of breath.    Dispense:  150 mL    Refill:  2    Time spent: 30 Minutes   This patient was seen by Blima Ledger AGNP-C in Collaboration with Dr Lyndon Code as a part of collaborative care agreement     Johnna Acosta AGNP-C Internal medicine

## 2019-11-12 ENCOUNTER — Other Ambulatory Visit: Payer: Self-pay

## 2019-11-12 ENCOUNTER — Ambulatory Visit: Payer: Medicaid Other | Admitting: Internal Medicine

## 2019-11-12 DIAGNOSIS — R0602 Shortness of breath: Secondary | ICD-10-CM

## 2019-11-12 LAB — PULMONARY FUNCTION TEST

## 2019-11-16 NOTE — Procedures (Signed)
[facility] [address] [place], [address]  DATE OF SERVICE: November 12, 2019  Complete Pulmonary Function Testing Interpretation:  FINDINGS:  Forced vital capacity is mildly decreased.  FEV1 is 2.41 L which is 65% of predicted and is mildly decreased.  F1 FVC ratio is mildly decreased.  Postbronchodilator there is no significant improvement in the FEV1 clinical improvement may still occur in the absence of spirometric improvement.  The total lung capacity is normal residual volume is increased residual abdominal capacity ratio is increased.  FRC is increased.  DLCO was mildly decreased however was normal when corrected for alveolar volume.  IMPRESSION:  Pulmonary function study is consistent with mild obstructive lung disease.  There did not appear to be any significant response to bronchodilators.  Yevonne Pax, MD [facility] Pulmonary Critical Care Medicine Sleep Medicine

## 2019-12-09 ENCOUNTER — Ambulatory Visit
Admission: RE | Admit: 2019-12-09 | Discharge: 2019-12-09 | Disposition: A | Discharge status: Home / Self Care | Payer: Medicaid Other | Attending: Hospice and Palliative Medicine | Admitting: Hospice and Palliative Medicine

## 2019-12-09 ENCOUNTER — Ambulatory Visit
Admission: RE | Admit: 2019-12-09 | Discharge: 2019-12-09 | Disposition: A | Discharge status: Home / Self Care | Payer: Medicaid Other | Source: Ambulatory Visit | Attending: Hospice and Palliative Medicine | Admitting: Hospice and Palliative Medicine

## 2019-12-09 DIAGNOSIS — F17219 Nicotine dependence, cigarettes, with unspecified nicotine-induced disorders: Secondary | ICD-10-CM | POA: Diagnosis present

## 2019-12-24 ENCOUNTER — Other Ambulatory Visit: Payer: Self-pay | Admitting: Internal Medicine

## 2019-12-24 DIAGNOSIS — K219 Gastro-esophageal reflux disease without esophagitis: Secondary | ICD-10-CM

## 2020-01-19 ENCOUNTER — Other Ambulatory Visit: Payer: Self-pay

## 2020-01-19 ENCOUNTER — Telehealth: Payer: Self-pay

## 2020-01-19 ENCOUNTER — Ambulatory Visit: Payer: Medicaid Other | Admitting: Internal Medicine

## 2020-01-19 ENCOUNTER — Encounter: Payer: Self-pay | Admitting: Internal Medicine

## 2020-01-19 VITALS — BP 117/63 | HR 76 | Temp 97.5°F | Resp 16 | Ht 68.0 in | Wt 194.2 lb

## 2020-01-19 DIAGNOSIS — J449 Chronic obstructive pulmonary disease, unspecified: Secondary | ICD-10-CM | POA: Diagnosis not present

## 2020-01-19 DIAGNOSIS — F17219 Nicotine dependence, cigarettes, with unspecified nicotine-induced disorders: Secondary | ICD-10-CM

## 2020-01-19 DIAGNOSIS — K219 Gastro-esophageal reflux disease without esophagitis: Secondary | ICD-10-CM

## 2020-01-19 DIAGNOSIS — Z9981 Dependence on supplemental oxygen: Secondary | ICD-10-CM | POA: Diagnosis not present

## 2020-01-19 DIAGNOSIS — R0602 Shortness of breath: Secondary | ICD-10-CM

## 2020-01-19 MED ORDER — IPRATROPIUM-ALBUTEROL 0.5-2.5 (3) MG/3ML IN SOLN: 3.0000 mL | Freq: Four times a day (QID) | RESPIRATORY_TRACT | 4 refills | Status: DC | PRN

## 2020-01-19 NOTE — Telephone Encounter (Signed)
Spoke with Clifford Burgess from [facility] that we put order for Oxygen in Epic and also faxed 6 minute walk to [facility]

## 2020-01-19 NOTE — Patient Instructions (Signed)
Chronic Obstructive Pulmonary Disease  Chronic obstructive pulmonary disease (COPD) is a long-term (chronic) lung problem. When you have COPD, it is hard for air to get in and out of your lungs. Usually the condition gets worse over time, and your lungs will never return to normal. There are things you can do to keep yourself as healthy as possible. What are the causes?  Smoking. This is the most common cause.  Certain genes passed from parent to child (inherited). What increases the risk?  Being exposed to secondhand smoke from cigarettes, pipes, or cigars.  Being exposed to chemicals and other irritants, such as fumes and dust in the work environment.  Having chronic lung conditions or infections. What are the signs or symptoms?  Shortness of breath, especially during physical activity.  A long-term cough with a large amount of thick mucus. Sometimes, the cough may not have any mucus (dry cough).  Wheezing.  Breathing quickly.  Skin that looks gray or blue, especially in the fingers, toes, or lips.  Feeling tired (fatigue).  Weight loss.  Chest tightness.  Having infections often.  Episodes when breathing symptoms become much worse (exacerbations). At the later stages of this disease, you may have swelling in the ankles, feet, or legs. How is this treated?  Taking medicines.  Quitting smoking, if you smoke.  Rehabilitation. This includes steps to make your body work better. It may involve a team of specialists.  Doing exercises.  Making changes to your diet.  Using oxygen.  Lung surgery.  Lung transplant.  Comfort measures (palliative care). Follow these instructions at home: Medicines  Take over-the-counter and prescription medicines only as told by your doctor.  Talk to your doctor before taking any cough or allergy medicines. You may need to avoid medicines that cause your lungs to be dry. Lifestyle  If you smoke, stop smoking. Smoking makes the  problem worse.  Do not smoke or use any products that contain nicotine or tobacco. If you need help quitting, ask your doctor.  Avoid being around things that make your breathing worse. This may include smoke, chemicals, and fumes.  Stay active, but remember to rest as well.  Learn and use tips on how to manage stress and control your breathing.  Make sure you get enough sleep. Most adults need at least 7 hours of sleep every night.  Eat healthy foods. Eat smaller meals more often. Rest before meals. Controlled breathing Learn and use tips on how to control your breathing as told by your doctor. Try:  Breathing in (inhaling) through your nose for 1 second. Then, pucker your lips and breath out (exhale) through your lips for 2 seconds.  Putting one hand on your belly (abdomen). Breathe in slowly through your nose for 1 second. Your hand on your belly should move out. Pucker your lips and breathe out slowly through your lips. Your hand on your belly should move in as you breathe out.   Controlled coughing Learn and use controlled coughing to clear mucus from your lungs. Follow these steps: 1. Lean your head a little forward. 2. Breathe in deeply. 3. Try to hold your breath for 3 seconds. 4. Keep your mouth slightly open while coughing 2 times. 5. Spit any mucus out into a tissue. 6. Rest and do the steps again 1 or 2 times as needed. General instructions  Make sure you get all the shots (vaccines) that your doctor recommends. Ask your doctor about a flu shot and a pneumonia shot.    Use oxygen therapy and pulmonary rehabilitation if told by your doctor. If you need home oxygen therapy, ask your doctor if you should buy a tool to measure your oxygen level (oximeter).  Make a COPD action plan with your doctor. This helps you to know what to do if you feel worse than usual.  Manage any other conditions you have as told by your doctor.  Avoid going outside when it is very hot, cold, or  humid.  Avoid people who have a sickness you can catch (contagious).  Keep all follow-up visits. Contact a doctor if:  You cough up more mucus than usual.  There is a change in the color or thickness of the mucus.  It is harder to breathe than usual.  Your breathing is faster than usual.  You have trouble sleeping.  You need to use your medicines more often than usual.  You have trouble doing your normal activities such as getting dressed or walking around the house. Get help right away if:  You have shortness of breath while resting.  You have shortness of breath that stops you from: ? Being able to talk. ? Doing normal activities.  Your chest hurts for longer than 5 minutes.  Your skin color is more blue than usual.  Your pulse oximeter shows that you have low oxygen for longer than 5 minutes.  You have a fever.  You feel too tired to breathe normally. These symptoms may represent a serious problem that is an emergency. Do not wait to see if the symptoms will go away. Get medical help right away. Call your local emergency services (911 in the [place] Do not drive yourself to the hospital. Summary  Chronic obstructive pulmonary disease (COPD) is a long-term lung problem.  The way your lungs work will never return to normal. Usually the condition gets worse over time. There are things you can do to keep yourself as healthy as possible.  Take over-the-counter and prescription medicines only as told by your doctor.  If you smoke, stop. Smoking makes the problem worse. This information is not intended to replace advice given to you by your health care provider. Make sure you discuss any questions you have with your health care provider. Document Revised: 11/04/2019 Document Reviewed: 11/04/2019 Elsevier Patient Education  2021 Elsevier Inc.   

## 2020-01-19 NOTE — Progress Notes (Signed)
[facility] [address] [place], [place] [address]  Pulmonary Sleep Medicine   Office Visit Note  Patient Name: Masaru Chamberlin Speich DOB: [birth year] MRN [id number]  Date of Service: 01/19/2020  Complaints/HPI: MILD COPD noted on the pulmonary function which was performed in November.  The FEV1 was noted to be 65% which is mild and patient appears to be controlled on the current regimen.  Patient does still have occasional cough and some shortness of breath noted.  Denies having any chest pain no palpitations.  No recent admissions to the hospital noted either.  ROS  General: (-) fever, (-) chills, (-) night sweats, (-) weakness Skin: (-) rashes, (-) itching,. Eyes: (-) visual changes, (-) redness, (-) itching. Nose and Sinuses: (-) nasal stuffiness or itchiness, (-) postnasal drip, (-) nosebleeds, (-) sinus trouble. Mouth and Throat: (-) sore throat, (-) hoarseness. Neck: (-) swollen glands, (-) enlarged thyroid, (-) neck pain. Respiratory: + cough, (-) bloody sputum, + shortness of breath, - wheezing. Cardiovascular: - ankle swelling, (-) chest pain. Lymphatic: (-) lymph node enlargement. Neurologic: (-) numbness, (-) tingling. Psychiatric: (-) anxiety, (-) depression   Current Medication: Outpatient Encounter Medications as of 01/19/2020  Medication Sig   albuterol (PROVENTIL) (2.5 MG/3ML) 0.083% nebulizer solution Take 3 mLs (2.5 mg total) by nebulization every 6 (six) hours as needed for wheezing or shortness of breath.   albuterol (VENTOLIN HFA) 108 (90 Base) MCG/ACT inhaler Inhale 2 puffs into the lungs every 6 (six) hours as needed for wheezing or shortness of breath.   aspirin EC 81 MG tablet Take 81 mg by mouth daily.   estradiol (ESTRACE) 2 MG tablet Take 2 mg by mouth 2 (two) times daily.    naproxen (NAPROSYN) 500 MG tablet Take 1 tablet (500 mg total) by mouth 2 (two) times daily with a meal.   omeprazole (PRILOSEC) 20 MG capsule TAKE 1 CAPSULE BY MOUTH  EVERY DAY   OXYGEN Inhale 2 L into the lungs. At night   spironolactone (ALDACTONE) 50 MG tablet Take 50 mg by mouth once.   traMADol (ULTRAM) 50 MG tablet Take 1 tablet (50 mg total) by mouth every 6 (six) hours as needed.   No facility-administered encounter medications on file as of 01/19/2020.    Surgical History: Past Surgical History:  Procedure Laterality Date   HERNIA REPAIR      Medical History: Past Medical History:  Diagnosis Date   Asthma    COPD (chronic obstructive pulmonary disease) (HCC)    GERD (gastroesophageal reflux disease)     Family History: Family History  Problem Relation Age of Onset   Hypertension Father     Social History: Social History   Socioeconomic History   Marital status: Divorced    Spouse name: Not on file   Number of children: 2   Years of education: Not on file   Highest education level: 8th grade  Occupational History   Not on file  Tobacco Use   Smoking status: Current Every Day Smoker    Packs/day: 0.50    Types: Cigarettes   Smokeless tobacco: Former Neurosurgeon    Types: Associate Professor Use: Never used  Substance and Sexual Activity   Alcohol use: Yes    Alcohol/week: 2.0 standard drinks    Types: 2 Cans of beer per week   Drug use: Not Currently    Types: Cocaine, Marijuana, Heroin, LSD, "Crack" cocaine   Sexual activity: Not Currently  Other Topics Concern  Not on file  Social History Narrative   Not on file   Social Determinants of Health   Financial Resource Strain: Not on file  Food Insecurity: Not on file  Transportation Needs: Not on file  Physical Activity: Not on file  Stress: Not on file  Social Connections: Not on file  Intimate Partner Violence: Not on file    Vital Signs: Blood pressure 117/63, pulse 76, temperature (!) 97.5 F (36.4 C), resp. rate 16, height 5\' 8"  (1.727 m), weight 194 lb 3.2 oz (88.1 kg), SpO2 96 %.  Examination: General Appearance: The patient is well-developed,  well-nourished, and in no distress. Skin: Gross inspection of skin unremarkable. Head: normocephalic, no gross deformities. Eyes: no gross deformities noted. ENT: ears appear grossly normal no exudates. Neck: Supple. No thyromegaly. No LAD. Respiratory: no rhonchi noted at this time. Cardiovascular: Normal S1 and S2 without murmur or rub. Extremities: No cyanosis. pulses are equal. Neurologic: Alert and oriented. No involuntary movements.  LABS: Recent Results (from the past 2160 hour(s))  Pulmonary function test     Status: None   Collection Time: 11/12/19 12:00 AM  Result Value Ref Range   FEV1     FVC     FEV1/FVC     TLC     DLCO      Radiology: DG Chest 2 View  Result Date: 12/10/2019 CLINICAL DATA:  Cough and history of tobacco use EXAM: CHEST - 2 VIEW COMPARISON:  None. FINDINGS: Cardiac shadow is within normal limits. The lungs are clear bilaterally. No focal infiltrate or effusion is seen. Degenerative changes of the thoracic spine are noted. IMPRESSION: No active cardiopulmonary disease. Electronically Signed   By: 14/01/2019 M.D.   On: 12/10/2019 01:16    No results found.  No results found.    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/09/2018   MDD (major depressive disorder), recurrent, in partial remission (HCC) 07/09/2018   Alcohol use disorder, moderate, dependence (HCC) 07/09/2018   Tobacco use disorder 07/09/2018   Bipolar 1 disorder, depressed, severe (HCC) 01/25/2018   Drug overdose 01/23/2018   COPD exacerbation (HCC) 11/27/2017   GERD (gastroesophageal reflux disease) 11/27/2017   Dependence on nocturnal oxygen therapy 10/15/2017   Smoker 10/15/2017   SOB (shortness of breath) 10/15/2017   Alcohol abuse 10/05/2017   Drug abuse (HCC) 10/05/2017    COPD mild disease based on the most recent PFTs plan is going to be to suggest using inhalers as prescribed patient is  supposed to be using the Proventil nebulizer.  In addition to that patient also is supposed to be on oxygen therapy at nighttime which is prescribed for nocturnal desaturations. GERD with use of PPIs and H2 blockers on this also can help with the shortness of breath ETOH not currently using any alcohol at this time Smoking still smoking about a pack a day.  Patient is strongly encouraged to quit smoking has this COPD may progress with ongoing tobacco use  General Counseling: I have discussed the findings of the evaluation and examination with 10/07/2017.  I have also discussed any further diagnostic evaluation thatmay be needed or ordered today. Miller verbalizes understanding of the findings of todays visit. We also reviewed her medications today and discussed drug interactions and side effects including but not limited excessive drowsiness and altered mental states. We also discussed that there is always a risk not just to her but  also people around her. she has been encouraged to call the office with any questions or concerns that should arise related to todays visit.  Orders Placed This Encounter  Procedures   For home use only DME oxygen    Order Specific Question:   Length of Need    Answer:   Lifetime    Order Specific Question:   Mode or (Route)    Answer:   Nasal cannula    Order Specific Question:   Liters per Minute    Answer:   2    Order Specific Question:   Frequency    Answer:   Continuous (stationary and portable oxygen unit needed)    Order Specific Question:   Oxygen conserving device    Answer:   Yes    Order Specific Question:   Oxygen delivery system    Answer:   Gas     Time spent: 61  I have personally obtained a history, examined the patient, evaluated laboratory and imaging results, formulated the assessment and plan and placed orders.    Yevonne Pax, MD [facility] Pulmonary and Critical Care Sleep medicine

## 2020-01-23 ENCOUNTER — Other Ambulatory Visit: Payer: Self-pay | Admitting: Adult Health

## 2020-01-23 DIAGNOSIS — J449 Chronic obstructive pulmonary disease, unspecified: Secondary | ICD-10-CM

## 2020-04-27 ENCOUNTER — Other Ambulatory Visit: Payer: Self-pay

## 2020-04-27 ENCOUNTER — Ambulatory Visit: Payer: Medicaid Other | Admitting: Hospice and Palliative Medicine

## 2020-04-27 ENCOUNTER — Encounter: Payer: Self-pay | Admitting: Hospice and Palliative Medicine

## 2020-04-27 VITALS — BP 124/74 | HR 83 | Temp 98.4°F | Resp 16 | Ht 68.0 in | Wt 201.2 lb

## 2020-04-27 DIAGNOSIS — J449 Chronic obstructive pulmonary disease, unspecified: Secondary | ICD-10-CM | POA: Diagnosis not present

## 2020-04-27 DIAGNOSIS — K219 Gastro-esophageal reflux disease without esophagitis: Secondary | ICD-10-CM | POA: Diagnosis not present

## 2020-04-27 DIAGNOSIS — R5383 Other fatigue: Secondary | ICD-10-CM | POA: Diagnosis not present

## 2020-04-27 DIAGNOSIS — Z1211 Encounter for screening for malignant neoplasm of colon: Secondary | ICD-10-CM

## 2020-04-27 DIAGNOSIS — Z122 Encounter for screening for malignant neoplasm of respiratory organs: Secondary | ICD-10-CM

## 2020-04-27 DIAGNOSIS — F17219 Nicotine dependence, cigarettes, with unspecified nicotine-induced disorders: Secondary | ICD-10-CM

## 2020-04-27 MED ORDER — ALBUTEROL SULFATE HFA 108 (90 BASE) MCG/ACT IN AERS: 2.0000 | INHALATION_SPRAY | Freq: Four times a day (QID) | RESPIRATORY_TRACT | 3 refills | Status: DC | PRN

## 2020-04-27 NOTE — Progress Notes (Signed)
[facility] [address] [place], [place] [address]  Internal MEDICINE  Office Visit Note  Patient Name: Clifford Burgess  [id number]  [id number]  Date of Service: 05/04/2020  Chief Complaint  Patient presents with  . COPD  . Asthma  . 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 albuterol (PROVENTIL) (2.5 MG/3ML) 0.083% nebulizer solution Take 3 mLs (2.5 mg total) by nebulization every 6 (six) hours as needed for wheezing or shortness of breath.  Marland Kitchen aspirin EC 81 MG tablet Take 81 mg by mouth daily.  Marland Kitchen estradiol (ESTRACE) 2 MG tablet Take 2 mg by mouth 2 (two) times daily.   Marland Kitchen ipratropium-albuterol (DUONEB) 0.5-2.5 (3) MG/3ML SOLN Take 3 mLs by nebulization every 6 (six) hours as needed.  . naproxen (NAPROSYN) 500 MG tablet Take 1 tablet (500 mg total) by mouth 2 (two) times daily with a meal.  . omeprazole (PRILOSEC) 20 MG capsule TAKE 1 CAPSULE BY MOUTH EVERY DAY  . OXYGEN Inhale 2 L into the lungs. At night  . spironolactone (ALDACTONE) 50 MG tablet Take 50 mg by mouth once.  . traMADol (ULTRAM) 50 MG tablet Take 1 tablet (50 mg total) by mouth every 6 (six) hours as needed.  . [DISCONTINUED] albuterol (VENTOLIN HFA) 108 (90 Base) MCG/ACT inhaler Inhale 2 puffs into the lungs every 6 (six) hours as needed for wheezing or shortness of breath.  Marland Kitchen albuterol (VENTOLIN HFA) 108 (90 Base) MCG/ACT inhaler  Inhale 2 puffs into the lungs every 6 (six) hours as needed for wheezing or shortness of breath.   No facility-administered encounter medications on file as of 04/27/2020.    Surgical History: Past Surgical History:  Procedure Laterality Date  . HERNIA REPAIR      Medical History: Past Medical History:  Diagnosis Date  . Asthma   . COPD (chronic obstructive pulmonary disease) (HCC)   . GERD (gastroesophageal reflux disease)     Family History: Family History  Problem Relation Age of Onset  . Hypertension Father     Social History   Socioeconomic History  . Marital status: Divorced    Spouse name: Not on file  . Number of children: 2  . Years of education: Not on file  . Highest education level: 8th grade  Occupational History  . Not on file  Tobacco Use  . Smoking status: Current Every Day Smoker    Packs/day: 1.00    Years: 36.00    Pack years: 36.00    Types: Cigarettes  . Smokeless tobacco: Former Neurosurgeon    Types: Engineer, drilling  . Vaping Use: Never used  Substance and Sexual Activity  . Alcohol use: Yes    Alcohol/week: 2.0 standard drinks    Types: 2 Cans of beer per week  . Drug use: Not Currently    Types: Cocaine, Marijuana, Heroin, LSD, "Crack" cocaine  . Sexual activity: Not Currently  Other  Topics Concern  . Not on file  Social History Narrative  . Not on file   Social Determinants of Health   Financial Resource Strain: Not on file  Food Insecurity: Not on file  Transportation Needs: Not on file  Physical Activity: Not on file  Stress: Not on file  Social Connections: Not on file  Intimate Partner Violence: Not on file      Review of Systems  Constitutional: Negative for chills, diaphoresis and fatigue.  HENT: Negative for ear pain, postnasal drip and sinus pressure.   Eyes: Negative for photophobia, discharge, redness, itching and visual disturbance.  Respiratory: Negative for cough, shortness of breath and wheezing.    Cardiovascular: Negative for chest pain, palpitations and leg swelling.  Gastrointestinal: Negative for abdominal pain, constipation, diarrhea, nausea and vomiting.  Genitourinary: Negative for dysuria and flank pain.  Musculoskeletal: Negative for arthralgias, back pain, gait problem and neck pain.  Skin: Negative for color change.  Allergic/Immunologic: Negative for environmental allergies and food allergies.  Neurological: Negative for dizziness and headaches.  Hematological: Does not bruise/bleed easily.  Psychiatric/Behavioral: Negative for agitation, behavioral problems (depression) and hallucinations.    Vital Signs: BP 124/74   Pulse 83   Temp 98.4 F (36.9 C)   Resp 16   Ht 5\' 8"  (1.727 m)   Wt 201 lb 3.2 oz (91.3 kg)   SpO2 93%   BMI 30.59 kg/m    Physical Exam Vitals reviewed.  Constitutional:      Appearance: Normal appearance. She is normal weight.  Cardiovascular:     Rate and Rhythm: Normal rate and regular rhythm.     Pulses: Normal pulses.     Heart sounds: Normal heart sounds.  Pulmonary:     Effort: Pulmonary effort is normal.     Breath sounds: Normal breath sounds.  Abdominal:     General: Abdomen is flat.     Palpations: Abdomen is soft.  Musculoskeletal:        General: Normal range of motion.     Cervical back: Normal range of motion.  Skin:    General: Skin is warm.  Neurological:     General: No focal deficit present.     Mental Status: She is alert and oriented to person, place, and time. Mental status is at baseline.  Psychiatric:        Mood and Affect: Mood normal.        Behavior: Behavior normal.        Thought Content: Thought content normal.        Judgment: Judgment normal.    Assessment/Plan: 1. Chronic obstructive pulmonary disease, unspecified COPD type (HCC) Breathing remains stable, requesting refills of albuterol, followed by pulmonology - albuterol (VENTOLIN HFA) 108 (90 Base) MCG/ACT inhaler; Inhale 2 puffs into the  lungs every 6 (six) hours as needed for wheezing or shortness of breath.  Dispense: 18 g; Refill: 3  2. Cigarette nicotine dependence with nicotine-induced disorder Smoking cessation counseling: 1. Pt acknowledges the risks of long term smoking, she will try to quite smoking. 2. Options for different medications including nicotine products, chewing gum, patch etc, Wellbutrin and Chantix is discussed 3. Goal and date of compete cessation is discussed 4. Total time spent in smoking cessation is 15 min.   3. Gastroesophageal reflux disease without esophagitis Symptoms remain well controlled on PPI, continue to monitor  4. Screening for lung cancer - Ambulatory Referral for Lung Cancer Scre  5. Screening for colon cancer Will get  set up for Cologuard screening  6. Other fatigue - CBC w/Diff/Platelet - Comprehensive Metabolic Panel (CMET) - Lipid Panel With LDL/HDL Ratio - TSH + free T4 - PSA  General Counseling: Woodson verbalizes understanding of the findings of todays visit and agrees with plan of treatment. I have discussed any further diagnostic evaluation that may be needed or ordered today. We also reviewed her medications today. she has been encouraged to call the office with any questions or concerns that should arise related to todays visit.    Orders Placed This Encounter  Procedures  . CBC w/Diff/Platelet  . Comprehensive Metabolic Panel (CMET)  . Lipid Panel With LDL/HDL Ratio  . TSH + free T4  . PSA  . Ambulatory Referral for Lung Cancer Scre    Meds ordered this encounter  Medications  . albuterol (VENTOLIN HFA) 108 (90 Base) MCG/ACT inhaler    Sig: Inhale 2 puffs into the lungs every 6 (six) hours as needed for wheezing or shortness of breath.    Dispense:  18 g    Refill:  3    Time spent: 30 Minutes Time spent includes review of chart, medications, test results and follow-up plan with the patient.  This patient was seen by Leeanne Deed AGNP-C in  Collaboration with Dr Lyndon Code as a part of collaborative care agreement     Lubertha Basque. Daylyn Christine AGNP-C Internal medicine

## 2020-04-29 ENCOUNTER — Telehealth: Payer: Self-pay | Admitting: *Deleted

## 2020-04-29 ENCOUNTER — Encounter: Payer: Self-pay | Admitting: *Deleted

## 2020-04-29 DIAGNOSIS — Z87891 Personal history of nicotine dependence: Secondary | ICD-10-CM

## 2020-04-29 DIAGNOSIS — F17200 Nicotine dependence, unspecified, uncomplicated: Secondary | ICD-10-CM

## 2020-04-29 DIAGNOSIS — Z122 Encounter for screening for malignant neoplasm of respiratory organs: Secondary | ICD-10-CM

## 2020-04-29 NOTE — Telephone Encounter (Signed)
Received referral for initial lung cancer screening scan. Contacted patient and obtained smoking history,(curent smoker, 1 ppd X 36 yrs) as well as answering questions related to screening process. Patient denies signs of lung cancer such as weight loss or hemoptysis. Patient denies comorbidity that would prevent curative treatment if lung cancer were found. Patient is scheduled for shared decision making visit and CT scan on 05/14/20 @ 1:25 pm.

## 2020-04-30 ENCOUNTER — Telehealth: Payer: Self-pay

## 2020-04-30 NOTE — Telephone Encounter (Signed)
Faxed Cologuard 

## 2020-05-04 ENCOUNTER — Encounter: Payer: Self-pay | Admitting: Hospice and Palliative Medicine

## 2020-05-10 ENCOUNTER — Encounter: Payer: Self-pay | Admitting: *Deleted

## 2020-05-14 ENCOUNTER — Ambulatory Visit: Payer: Medicaid Other

## 2020-05-14 ENCOUNTER — Telehealth: Payer: Medicaid Other | Admitting: Oncology

## 2020-05-28 ENCOUNTER — Telehealth: Payer: Self-pay

## 2020-05-28 NOTE — Telephone Encounter (Signed)
Called pt with results of cologuard was negative

## 2020-06-01 LAB — COLOGUARD

## 2020-07-10 ENCOUNTER — Other Ambulatory Visit: Payer: Self-pay | Admitting: Internal Medicine

## 2020-07-10 DIAGNOSIS — K219 Gastro-esophageal reflux disease without esophagitis: Secondary | ICD-10-CM

## 2020-07-19 ENCOUNTER — Ambulatory Visit: Payer: Medicaid Other | Admitting: Nurse Practitioner

## 2020-07-19 ENCOUNTER — Encounter: Payer: Self-pay | Admitting: Nurse Practitioner

## 2020-07-19 ENCOUNTER — Other Ambulatory Visit: Payer: Self-pay

## 2020-07-19 VITALS — BP 116/78 | HR 80 | Temp 97.2°F | Resp 16 | Ht 68.5 in | Wt 191.8 lb

## 2020-07-19 DIAGNOSIS — J449 Chronic obstructive pulmonary disease, unspecified: Secondary | ICD-10-CM | POA: Diagnosis not present

## 2020-07-19 DIAGNOSIS — R0602 Shortness of breath: Secondary | ICD-10-CM

## 2020-07-19 DIAGNOSIS — F17219 Nicotine dependence, cigarettes, with unspecified nicotine-induced disorders: Secondary | ICD-10-CM

## 2020-07-19 NOTE — Progress Notes (Signed)
[facility] [address] [place], [place] [address]  Internal MEDICINE  Office Visit Note  Patient Name: Clifford Burgess  [id number]  [id number]  Date of Service: 07/19/2020  Chief Complaint  Patient presents with   Follow-up    HPI Clifford Burgess presents for a follow up visit for pulmonary. She has a history of COPD and is a current smoker. Clifford Burgess done in office today. FVC worsened by 0.26 since previous spirometry and FEV1 worsened by 0.22 since previous spirometry results. Overall FEV1/FVC ratio worsened by 1.34% Patient continues to smoke 1/2 to 1 ppd. She is interested in quitting smoking but wants to try on her own first.She is not interested in medications to help her quit smoking at this time. But she will call the clinic if she needs assistance with smoking cessation.  -currently using albuterol and duoneb nebulizer treatments as needed and albuterol rescue inhaler. She is not taking any maintenance inhaler at this time.      Current Medication: Outpatient Encounter Medications as of 07/19/2020  Medication Sig   albuterol (PROVENTIL) (2.5 MG/3ML) 0.083% nebulizer solution Take 3 mLs (2.5 mg total) by nebulization every 6 (six) hours as needed for wheezing or shortness of breath.   aspirin EC 81 MG tablet Take 81 mg by mouth daily.   estradiol (ESTRACE) 2 MG tablet Take 2 mg by mouth 3 (three) times daily.   ipratropium-albuterol (DUONEB) 0.5-2.5 (3) MG/3ML SOLN Take 3 mLs by nebulization every 6 (six) hours as needed.   omeprazole (PRILOSEC) 20 MG capsule TAKE 1 CAPSULE BY MOUTH EVERY DAY   OXYGEN Inhale 2 L into the lungs. At night   spironolactone (ALDACTONE) 50 MG tablet Take 50 mg by mouth 2 (two) times daily.   [DISCONTINUED] albuterol (VENTOLIN HFA) 108 (90 Base) MCG/ACT inhaler Inhale 2 puffs into the lungs every 6 (six) hours as needed for wheezing or shortness of breath.   [DISCONTINUED] naproxen (NAPROSYN) 500 MG tablet Take 1 tablet (500 mg total) by mouth  2 (two) times daily with a meal.   No facility-administered encounter medications on file as of 07/19/2020.    Surgical History: Past Surgical History:  Procedure Laterality Date   HERNIA REPAIR      Medical History: Past Medical History:  Diagnosis Date   Asthma    COPD (chronic obstructive pulmonary disease) (HCC)    GERD (gastroesophageal reflux disease)     Family History: Family History  Problem Relation Age of Onset   Hypertension Father     Social History   Socioeconomic History   Marital status: Divorced    Spouse name: Not on file   Number of children: 2   Years of education: Not on file   Highest education level: 8th grade  Occupational History   Not on file  Tobacco Use   Smoking status: Every Day    Packs/day: 1.00    Years: 36.00    Pack years: 36.00    Types: Cigarettes   Smokeless tobacco: Former    Types: Associate Professor Use: Never used  Substance and Sexual Activity   Alcohol use: Yes    Alcohol/week: 2.0 standard drinks    Types: 2 Cans of beer per week   Drug use: Not Currently    Types: Cocaine, Marijuana, Heroin, LSD, "Crack" cocaine   Sexual activity: Not Currently  Other Topics Concern   Not on file  Social History Narrative   Not on file   Social  Determinants of Health   Financial Resource Strain: Not on file  Food Insecurity: Not on file  Transportation Needs: Not on file  Physical Activity: Not on file  Stress: Not on file  Social Connections: Not on file  Intimate Partner Violence: Not on file      Review of Systems  Constitutional:  Negative for chills, fatigue and unexpected weight change.  HENT:  Negative for congestion, rhinorrhea, sneezing and sore throat.   Eyes:  Negative for redness.  Respiratory:  Positive for cough, shortness of breath and wheezing. Negative for chest tightness.   Cardiovascular:  Negative for chest pain and palpitations.  Gastrointestinal:  Negative for abdominal pain,  constipation, diarrhea, nausea and vomiting.  Genitourinary:  Negative for dysuria and frequency.  Musculoskeletal:  Negative for arthralgias, back pain, joint swelling and neck pain.  Skin:  Negative for rash.  Neurological: Negative.  Negative for tremors and numbness.  Hematological:  Negative for adenopathy. Does not bruise/bleed easily.  Psychiatric/Behavioral:  Negative for behavioral problems (Depression), sleep disturbance and suicidal ideas. The patient is not nervous/anxious.    Vital Signs: BP 116/78   Pulse 80   Temp (!) 97.2 F (36.2 C)   Resp 16   Ht 5' 8.5" (1.74 m)   Wt 191 lb 12.8 oz (87 kg)   SpO2 95% Comment: RM air  BMI 28.74 kg/m    Physical Exam Vitals reviewed.  Constitutional:      General: She is not in acute distress.    Appearance: Normal appearance. She is well-developed. She is not ill-appearing or diaphoretic.  HENT:     Head: Normocephalic and atraumatic.  Eyes:     Pupils: Pupils are equal, round, and reactive to light.  Neck:     Thyroid: No thyromegaly.     Vascular: No JVD.     Trachea: No tracheal deviation.  Cardiovascular:     Rate and Rhythm: Normal rate and regular rhythm.     Pulses: Normal pulses.     Heart sounds: Normal heart sounds. No murmur heard.   No friction rub. No gallop.  Pulmonary:     Effort: Pulmonary effort is normal. No respiratory distress.     Breath sounds: Normal breath sounds. No wheezing or rales.  Chest:     Chest wall: No tenderness.  Skin:    General: Skin is warm and dry.     Capillary Refill: Capillary refill takes less than 2 seconds.  Neurological:     Mental Status: She is alert and oriented to person, place, and time.  Psychiatric:        Mood and Affect: Mood normal.        Behavior: Behavior normal.     Assessment/Plan: 1. Chronic obstructive pulmonary disease, unspecified COPD type (HCC) Spirometry worsened. No maintenance inhaler. Anoro ellipta samples provided at today's office  visit. Will send prescription if Anoro helps. Follow up in 3 months to assess if anoro is improving her breathing.   2. Cigarette nicotine dependence with nicotine-induced disorder Patient is interested in smoking cessation but declines assistance at this time.  Ready to quit: Yes Counseling given: Yes Smoking cessation counseling: Pt acknowledges the risks of long term smoking, she will try to quite smoking. Options for different medications including nicotine products, chewing gum, patch etc, Wellbutrin and Chantix is discussed Goal and date of complete cessation is discussed Total time spent in smoking cessation is 10 min.   3. SOB (shortness of breath) Spirometry done in  office today, results discussed at length with patient.  - Spirometry with Graph   General Counseling: Clifford Burgess understanding of the findings of todays visit and agrees with plan of treatment. I have discussed any further diagnostic evaluation that may be needed or ordered today. We also reviewed her medications today. she has been encouraged to call the office with any questions or concerns that should arise related to todays visit.    Orders Placed This Encounter  Procedures   Spirometry with Graph    No orders of the defined types were placed in this encounter.   Return in about 3 months (around 10/19/2020) for F/U, pulmonary only, Clifford Burgess PCP or DSK.   Total time spent:30 Minutes Time spent includes review of chart, medications, test results, and follow up plan with the patient.   Clifford Burgess was reviewed by me.  This patient was seen by Clifford Kuster, FNP-C in collaboration with Dr. Beverely Risen as a part of collaborative care agreement.   Clifford Burgess R. Tedd Sias, MSN, FNP-C Internal medicine

## 2020-07-23 ENCOUNTER — Other Ambulatory Visit: Payer: Self-pay

## 2020-07-23 DIAGNOSIS — J449 Chronic obstructive pulmonary disease, unspecified: Secondary | ICD-10-CM

## 2020-07-23 MED ORDER — ALBUTEROL SULFATE HFA 108 (90 BASE) MCG/ACT IN AERS: 2.0000 | INHALATION_SPRAY | Freq: Four times a day (QID) | RESPIRATORY_TRACT | 3 refills | Status: DC | PRN

## 2020-07-26 ENCOUNTER — Encounter: Payer: Self-pay | Admitting: Physician Assistant

## 2020-07-26 ENCOUNTER — Other Ambulatory Visit: Payer: Self-pay

## 2020-07-26 ENCOUNTER — Ambulatory Visit: Payer: Medicaid Other | Admitting: Physician Assistant

## 2020-07-26 VITALS — BP 128/82 | HR 76 | Temp 97.3°F | Resp 16 | Ht 68.0 in | Wt 191.4 lb

## 2020-07-26 DIAGNOSIS — J449 Chronic obstructive pulmonary disease, unspecified: Secondary | ICD-10-CM

## 2020-07-26 DIAGNOSIS — F17219 Nicotine dependence, cigarettes, with unspecified nicotine-induced disorders: Secondary | ICD-10-CM

## 2020-07-26 DIAGNOSIS — Z9981 Dependence on supplemental oxygen: Secondary | ICD-10-CM | POA: Diagnosis not present

## 2020-07-26 DIAGNOSIS — K219 Gastro-esophageal reflux disease without esophagitis: Secondary | ICD-10-CM

## 2020-07-26 MED ORDER — ANORO ELLIPTA 62.5-25 MCG/INH IN AEPB: 1.0000 | INHALATION_SPRAY | Freq: Every day | RESPIRATORY_TRACT | 3 refills | Status: DC

## 2020-07-26 NOTE — Progress Notes (Signed)
[facility] [address] [place], [place] [address]  Internal MEDICINE  Office Visit Note  Patient Name: Clifford Burgess  [id number]  [id number]  Date of Service: 08/01/2020  Chief Complaint  Patient presents with   Follow-up    Review labs   Gastroesophageal Reflux   Asthma   COPD    HPI Patient is here for routine follow-up and has no complaints today --Breathing has been stable, utilizes 2L oxygen all the time --Does continue to smoke approximately 1/2-1ppd, working on tapering down -Did not have labs done and will do this now --Reports she has other labs drawn frequently for monitoring her while on HRT - Cologuard completed and was negative  Current Medication: Outpatient Encounter Medications as of 07/26/2020  Medication Sig   albuterol (PROVENTIL) (2.5 MG/3ML) 0.083% nebulizer solution Take 3 mLs (2.5 mg total) by nebulization every 6 (six) hours as needed for wheezing or shortness of breath.   albuterol (VENTOLIN HFA) 108 (90 Base) MCG/ACT inhaler Inhale 2 puffs into the lungs every 6 (six) hours as needed for wheezing or shortness of breath.   aspirin EC 81 MG tablet Take 81 mg by mouth daily.   estradiol (ESTRACE) 2 MG tablet Take 2 mg by mouth 3 (three) times daily.   ipratropium-albuterol (DUONEB) 0.5-2.5 (3) MG/3ML SOLN Take 3 mLs by nebulization every 6 (six) hours as needed.   omeprazole (PRILOSEC) 20 MG capsule TAKE 1 CAPSULE BY MOUTH EVERY DAY   OXYGEN Inhale 2 L into the lungs. At night   spironolactone (ALDACTONE) 50 MG tablet Take 50 mg by mouth 2 (two) times daily.   No facility-administered encounter medications on file as of 07/26/2020.    Surgical History: Past Surgical History:  Procedure Laterality Date   HERNIA REPAIR      Medical History: Past Medical History:  Diagnosis Date   Asthma    COPD (chronic obstructive pulmonary disease) (HCC)    GERD (gastroesophageal reflux disease)     Family History: Family History   Problem Relation Age of Onset   Hypertension Father     Social History   Socioeconomic History   Marital status: Divorced    Spouse name: Not on file   Number of children: 2   Years of education: Not on file   Highest education level: 8th grade  Occupational History   Not on file  Tobacco Use   Smoking status: Every Day    Packs/day: 1.00    Years: 36.00    Pack years: 36.00    Types: Cigarettes   Smokeless tobacco: Former    Types: Associate Professor Use: Never used  Substance and Sexual Activity   Alcohol use: Yes    Alcohol/week: 2.0 standard drinks    Types: 2 Cans of beer per week   Drug use: Not Currently    Types: Cocaine, Marijuana, Heroin, LSD, "Crack" cocaine   Sexual activity: Not Currently  Other Topics Concern   Not on file  Social History Narrative   Not on file   Social Determinants of Health   Financial Resource Strain: Not on file  Food Insecurity: Not on file  Transportation Needs: Not on file  Physical Activity: Not on file  Stress: Not on file  Social Connections: Not on file  Intimate Partner Violence: Not on file      Review of Systems  Constitutional:  Negative for chills, fatigue and unexpected weight change.  HENT:  Positive for postnasal  drip. Negative for congestion, rhinorrhea, sneezing and sore throat.   Eyes:  Negative for redness.  Respiratory:  Positive for shortness of breath. Negative for cough, chest tightness and wheezing.   Cardiovascular:  Negative for chest pain and palpitations.  Gastrointestinal:  Negative for abdominal pain, constipation, diarrhea, nausea and vomiting.  Genitourinary:  Negative for dysuria and frequency.  Musculoskeletal:  Negative for arthralgias, back pain, joint swelling and neck pain.  Skin:  Negative for rash.  Neurological: Negative.  Negative for tremors and numbness.  Hematological:  Negative for adenopathy. Does not bruise/bleed easily.  Psychiatric/Behavioral:  Negative for  behavioral problems (Depression), sleep disturbance and suicidal ideas. The patient is not nervous/anxious.    Vital Signs: BP 128/82   Pulse 76   Temp (!) 97.3 F (36.3 C)   Resp 16   Ht 5\' 8"  (1.727 m)   Wt 191 lb 6.4 oz (86.8 kg)   SpO2 95% Comment: room air  BMI 29.10 kg/m    Physical Exam Vitals and nursing note reviewed.  Constitutional:      General: She is not in acute distress.    Appearance: She is well-developed. She is not diaphoretic.  HENT:     Head: Normocephalic and atraumatic.     Mouth/Throat:     Pharynx: No oropharyngeal exudate.  Eyes:     Pupils: Pupils are equal, round, and reactive to light.  Neck:     Thyroid: No thyromegaly.     Vascular: No JVD.     Trachea: No tracheal deviation.  Cardiovascular:     Rate and Rhythm: Normal rate and regular rhythm.     Heart sounds: Normal heart sounds. No murmur heard.   No friction rub. No gallop.  Pulmonary:     Effort: Pulmonary effort is normal. No respiratory distress.     Breath sounds: No wheezing or rales.  Chest:     Chest wall: No tenderness.  Abdominal:     General: Bowel sounds are normal.     Palpations: Abdomen is soft.  Musculoskeletal:        General: Normal range of motion.     Cervical back: Normal range of motion and neck supple.  Lymphadenopathy:     Cervical: No cervical adenopathy.  Skin:    General: Skin is warm and dry.  Neurological:     Mental Status: She is alert and oriented to person, place, and time.     Cranial Nerves: No cranial nerve deficit.  Psychiatric:        Behavior: Behavior normal.        Thought Content: Thought content normal.        Judgment: Judgment normal.       Assessment/Plan: 1. Chronic obstructive pulmonary disease, unspecified COPD type (HCC) Stable, followed by pulmonology. Inhaler sent to pharmacy since sample ran out  2. Cigarette nicotine dependence with nicotine-induced disorder Continue to work on tapering down and ultimately  stopping  3. Oxygen dependent Continue oxygen use  4. Gastroesophageal reflux disease without esophagitis Continue omeprazole   General Counseling: Harbert verbalizes understanding of the findings of todays visit and agrees with plan of treatment. I have discussed any further diagnostic evaluation that may be needed or ordered today. We also reviewed her medications today. she has been encouraged to call the office with any questions or concerns that should arise related to todays visit.    No orders of the defined types were placed in this encounter.   No orders  of the defined types were placed in this encounter.   This patient was seen by Lynn Ito, PA-C in collaboration with Dr. Beverely Risen as a part of collaborative care agreement.   Total time spent:30 Minutes Time spent includes review of chart, medications, test results, and follow up plan with the patient.      Dr Lyndon Code Internal medicine

## 2020-10-05 ENCOUNTER — Other Ambulatory Visit: Payer: Self-pay

## 2020-10-05 DIAGNOSIS — J449 Chronic obstructive pulmonary disease, unspecified: Secondary | ICD-10-CM

## 2020-10-05 MED ORDER — ALBUTEROL SULFATE HFA 108 (90 BASE) MCG/ACT IN AERS: 2.0000 | INHALATION_SPRAY | Freq: Four times a day (QID) | RESPIRATORY_TRACT | 3 refills | Status: DC | PRN

## 2020-10-25 ENCOUNTER — Ambulatory Visit: Payer: Medicaid Other | Admitting: Internal Medicine

## 2020-10-29 ENCOUNTER — Encounter: Payer: Self-pay | Admitting: Physician Assistant

## 2020-10-29 ENCOUNTER — Encounter: Payer: Medicaid Other | Admitting: Physician Assistant

## 2020-10-29 ENCOUNTER — Other Ambulatory Visit: Payer: Self-pay

## 2020-10-29 ENCOUNTER — Ambulatory Visit: Payer: Medicaid Other | Admitting: Physician Assistant

## 2020-10-29 DIAGNOSIS — F17219 Nicotine dependence, cigarettes, with unspecified nicotine-induced disorders: Secondary | ICD-10-CM

## 2020-10-29 DIAGNOSIS — Z0001 Encounter for general adult medical examination with abnormal findings: Secondary | ICD-10-CM

## 2020-10-29 DIAGNOSIS — Z9981 Dependence on supplemental oxygen: Secondary | ICD-10-CM

## 2020-10-29 DIAGNOSIS — J449 Chronic obstructive pulmonary disease, unspecified: Secondary | ICD-10-CM

## 2020-10-29 DIAGNOSIS — Z79890 Hormone replacement therapy (postmenopausal): Secondary | ICD-10-CM

## 2020-10-29 DIAGNOSIS — R300 Dysuria: Secondary | ICD-10-CM

## 2020-10-29 NOTE — Progress Notes (Signed)
[facility] [address] [place], [place] [address]  Internal MEDICINE  Office Visit Note  Patient Name: Clifford Burgess  [id number]  [id number]  Date of Service: 10/31/2020  Chief Complaint  Patient presents with   Annual Exam     HPI Pt is here for routine health maintenance examination and has no complaints today -Smoking 3/4-1ppd, working to cut back further -breathing is ok, without oxygen currently and needs to call company for supplies -Followed by pulmonology -Cologuard was negative -sleeping ok,goes to bed at 9-10pm and wakes up at 5-6am -does not exercise much, walks around home mainly -Is on HRT and has routine labs to monitor via specialist -Did not have lab work ordered previously, but will go today after visit  Current Medication: Outpatient Encounter Medications as of 10/29/2020  Medication Sig   albuterol (PROVENTIL) (2.5 MG/3ML) 0.083% nebulizer solution Take 3 mLs (2.5 mg total) by nebulization every 6 (six) hours as needed for wheezing or shortness of breath.   albuterol (VENTOLIN HFA) 108 (90 Base) MCG/ACT inhaler Inhale 2 puffs into the lungs every 6 (six) hours as needed for wheezing or shortness of breath.   aspirin EC 81 MG tablet Take 81 mg by mouth daily.   estradiol (ESTRACE) 2 MG tablet Take 2 mg by mouth 3 (three) times daily.   ipratropium-albuterol (DUONEB) 0.5-2.5 (3) MG/3ML SOLN Take 3 mLs by nebulization every 6 (six) hours as needed.   omeprazole (PRILOSEC) 20 MG capsule TAKE 1 CAPSULE BY MOUTH EVERY DAY   OXYGEN Inhale 2 L into the lungs. At night   progesterone (PROMETRIUM) 100 MG capsule SMARTSIG:1 Capsule(s) By Mouth Every Evening   spironolactone (ALDACTONE) 50 MG tablet Take 50 mg by mouth 2 (two) times daily.   umeclidinium-vilanterol (ANORO ELLIPTA) 62.5-25 MCG/INH AEPB Inhale 1 puff into the lungs daily.   No facility-administered encounter medications on file as of 10/29/2020.    Surgical History: Past  Surgical History:  Procedure Laterality Date   HERNIA REPAIR      Medical History: Past Medical History:  Diagnosis Date   Asthma    COPD (chronic obstructive pulmonary disease) (HCC)    GERD (gastroesophageal reflux disease)     Family History: Family History  Problem Relation Age of Onset   Hypertension Father       Review of Systems  Constitutional:  Negative for chills, fatigue and unexpected weight change.  HENT:  Positive for postnasal drip. Negative for congestion, rhinorrhea, sneezing and sore throat.   Eyes:  Negative for redness.  Respiratory:  Positive for shortness of breath. Negative for cough, chest tightness and wheezing.   Cardiovascular:  Negative for chest pain and palpitations.  Gastrointestinal:  Negative for abdominal pain, constipation, diarrhea, nausea and vomiting.  Genitourinary:  Negative for dysuria and frequency.  Musculoskeletal:  Negative for arthralgias, back pain, joint swelling and neck pain.  Skin:  Negative for rash.  Neurological: Negative.  Negative for tremors and numbness.  Hematological:  Negative for adenopathy. Does not bruise/bleed easily.  Psychiatric/Behavioral:  Negative for behavioral problems (Depression), sleep disturbance and suicidal ideas. The patient is not nervous/anxious.     Vital Signs: BP 120/69   Pulse 64   Temp 98.4 F (36.9 C)   Resp 16   Ht 5\' 8"  (1.727 m)   Wt 187 lb 3.2 oz (84.9 kg)   SpO2 96%   BMI 28.46 kg/m    Physical Exam Vitals and nursing note reviewed.  Constitutional:  General: He is not in acute distress.    Appearance: He is well-developed. He is not diaphoretic.  HENT:     Head: Normocephalic and atraumatic.     Mouth/Throat:     Pharynx: No oropharyngeal exudate.  Eyes:     Pupils: Pupils are equal, round, and reactive to light.  Neck:     Thyroid: No thyromegaly.     Vascular: No JVD.     Trachea: No tracheal deviation.  Cardiovascular:     Rate and Rhythm: Normal rate  and regular rhythm.     Heart sounds: Normal heart sounds. No murmur heard.   No friction rub. No gallop.  Pulmonary:     Effort: Pulmonary effort is normal. No respiratory distress.     Breath sounds: No wheezing or rales.  Chest:     Chest wall: No tenderness.  Abdominal:     General: Bowel sounds are normal.     Palpations: Abdomen is soft.  Musculoskeletal:        General: Normal range of motion.     Cervical back: Normal range of motion and neck supple.  Lymphadenopathy:     Cervical: No cervical adenopathy.  Skin:    General: Skin is warm and dry.  Neurological:     Mental Status: He is alert and oriented to person, place, and time.     Cranial Nerves: No cranial nerve deficit.  Psychiatric:        Behavior: Behavior normal.        Thought Content: Thought content normal.        Judgment: Judgment normal.     LABS: Recent Results (from the past 2160 hour(s))  UA/M w/rflx Culture, Routine     Status: Abnormal (Preliminary result)   Collection Time: 10/29/20 10:41 AM   Specimen: Urine   Urine  Result Value Ref Range   Specific Gravity, UA 1.020 1.005 - 1.030   pH, UA 8.0 (H) 5.0 - 7.5   Color, UA Yellow Yellow   Appearance Ur Turbid (A) Clear   Leukocytes,UA Negative Negative   Protein,UA Negative Negative/Trace   Glucose, UA Negative Negative   Ketones, UA Negative Negative   RBC, UA Negative Negative   Bilirubin, UA Negative Negative   Urobilinogen, Ur 0.2 0.2 - 1.0 mg/dL   Nitrite, UA Negative Negative   Microscopic Examination Comment     Comment: Microscopic follows if indicated.   Microscopic Examination See below:     Comment: Microscopic was indicated and was performed.   Urinalysis Reflex Comment     Comment: This specimen has reflexed to a Urine Culture.  Microscopic Examination     Status: Abnormal   Collection Time: 10/29/20 10:41 AM   Urine  Result Value Ref Range   WBC, UA 6-10 (A) 0 - 5 /hpf   RBC None seen 0 - 2 /hpf   Epithelial Cells  (non renal) 0-10 0 - 10 /hpf   Casts None seen None seen /lpf   Bacteria, UA Few None seen/Few  Urine Culture, Reflex     Status: None (Preliminary result)   Collection Time: 10/29/20 10:41 AM   Urine  Result Value Ref Range   Urine Culture, Routine WILL FOLLOW         Assessment/Plan: 1. Encounter for general adult medical examination with abnormal findings CPE performed, patient did not have labs done but will go now, Cologuard negative  2. Chronic obstructive pulmonary disease, unspecified COPD type (HCC) Stable, continue current  inhalers and follow-up with pulmonology  3. Cigarette nicotine dependence with nicotine-induced disorder Continue to work on smoking cessation  4. Oxygen dependent Continue oxygen, call DME company for supplies  5. Hormone replacement therapy (HRT) Followed by outside provider with routine lab draws  6. Dysuria - UA/M w/rflx Culture, Routine   General Counseling: servando kyllonen understanding of the findings of todays visit and agrees with plan of treatment. I have discussed any further diagnostic evaluation that may be needed or ordered today. We also reviewed his medications today. he has been encouraged to call the office with any questions or concerns that should arise related to todays visit.    Counseling:    Orders Placed This Encounter  Procedures   Microscopic Examination   Urine Culture, Reflex   UA/M w/rflx Culture, Routine    No orders of the defined types were placed in this encounter.   This patient was seen by Lynn Ito, PA-C in collaboration with Dr. Beverely Risen as a part of collaborative care agreement.  Total time spent:30 Minutes  Time spent includes review of chart, medications, test results, and follow up plan with the patient.     Lyndon Code, MD  Internal Medicine

## 2020-11-09 LAB — UA/M W/RFLX CULTURE, ROUTINE
Bilirubin, UA: NEGATIVE
Glucose, UA: NEGATIVE
Ketones, UA: NEGATIVE
Leukocytes,UA: NEGATIVE
Nitrite, UA: NEGATIVE
Protein,UA: NEGATIVE
RBC, UA: NEGATIVE
Specific Gravity, UA: 1.02 (ref 1.005–1.030)
Urobilinogen, Ur: 0.2 mg/dL (ref 0.2–1.0)
pH, UA: 8 — ABNORMAL HIGH (ref 5.0–7.5)

## 2020-11-09 LAB — MICROSCOPIC EXAMINATION
Casts: NONE SEEN /lpf
RBC, Urine: NONE SEEN /hpf (ref 0–2)

## 2020-11-09 LAB — URINE CULTURE, REFLEX

## 2020-11-25 ENCOUNTER — Encounter: Payer: Self-pay | Admitting: Nurse Practitioner

## 2020-11-25 ENCOUNTER — Ambulatory Visit: Payer: Medicaid Other | Admitting: Nurse Practitioner

## 2020-11-25 ENCOUNTER — Other Ambulatory Visit: Payer: Self-pay

## 2020-11-25 VITALS — BP 111/60 | HR 70 | Temp 98.2°F | Resp 16 | Ht 68.5 in | Wt 189.4 lb

## 2020-11-25 DIAGNOSIS — J449 Chronic obstructive pulmonary disease, unspecified: Secondary | ICD-10-CM

## 2020-11-25 DIAGNOSIS — R0602 Shortness of breath: Secondary | ICD-10-CM | POA: Diagnosis not present

## 2020-11-25 MED ORDER — UMECLIDINIUM-VILANTEROL 62.5-25 MCG/ACT IN AEPB: 1.0000 | INHALATION_SPRAY | Freq: Every day | RESPIRATORY_TRACT | 5 refills | Status: DC

## 2020-11-25 NOTE — Progress Notes (Signed)
[facility] [address] [place], [place] [address]  Internal MEDICINE  Office Visit Note  Patient Name: Clifford Burgess  [id number]  [id number]  Date of Service: 11/25/2020  Chief Complaint  Patient presents with   Follow-up    HPI Vincent presents for a follow up visit for COPD. He is wearing his oxygen at 2LPM via nasal cannula but overall feels like he is breathing better. His spirometry was done in office and showed slight improvement in FEV1, FVC and FVC/FEV1 ratio. At his previous office visit, he was provided with samples of anoro ellipta which has been working well for the patient and he has continued using it since the prescription was sent in after he started using the sample inhaler.  Patient continues to work on smoking cessation on his own.  -currently using albuterol and duoneb nebulizer treatments as needed and albuterol rescue inhaler and the anoro ellipta inhaler for maintenance which has shown improvement in his breathing and spirometry results. Refills provided for anoro ellipta.    Current Medication: Outpatient Encounter Medications as of 11/25/2020  Medication Sig   albuterol (PROVENTIL) (2.5 MG/3ML) 0.083% nebulizer solution Take 3 mLs (2.5 mg total) by nebulization every 6 (six) hours as needed for wheezing or shortness of breath.   albuterol (VENTOLIN HFA) 108 (90 Base) MCG/ACT inhaler Inhale 2 puffs into the lungs every 6 (six) hours as needed for wheezing or shortness of breath.   aspirin EC 81 MG tablet Take 81 mg by mouth daily.   estradiol (ESTRACE) 2 MG tablet Take 2 mg by mouth 3 (three) times daily.   ipratropium-albuterol (DUONEB) 0.5-2.5 (3) MG/3ML SOLN Take 3 mLs by nebulization every 6 (six) hours as needed.   omeprazole (PRILOSEC) 20 MG capsule TAKE 1 CAPSULE BY MOUTH EVERY DAY   OXYGEN Inhale 2 L into the lungs. At night   progesterone (PROMETRIUM) 100 MG capsule SMARTSIG:1 Capsule(s) By Mouth Every Evening   spironolactone  (ALDACTONE) 50 MG tablet Take 50 mg by mouth 2 (two) times daily.   [DISCONTINUED] umeclidinium-vilanterol (ANORO ELLIPTA) 62.5-25 MCG/INH AEPB Inhale 1 puff into the lungs daily.   umeclidinium-vilanterol (ANORO ELLIPTA) 62.5-25 MCG/ACT AEPB Inhale 1 puff into the lungs daily.   No facility-administered encounter medications on file as of 11/25/2020.    Surgical History: Past Surgical History:  Procedure Laterality Date   HERNIA REPAIR      Medical History: Past Medical History:  Diagnosis Date   Asthma    COPD (chronic obstructive pulmonary disease) (HCC)    GERD (gastroesophageal reflux disease)     Family History: Family History  Problem Relation Age of Onset   Hypertension Father     Social History   Socioeconomic History   Marital status: Divorced    Spouse name: Not on file   Number of children: 2   Years of education: Not on file   Highest education level: 8th grade  Occupational History   Not on file  Tobacco Use   Smoking status: Every Day    Packs/day: 1.00    Years: 36.00    Pack years: 36.00    Types: Cigarettes   Smokeless tobacco: Former    Types: Associate Professor Use: Never used  Substance and Sexual Activity   Alcohol use: Yes    Alcohol/week: 2.0 standard drinks    Types: 2 Cans of beer per week   Drug use: Not Currently    Types: Cocaine, Marijuana, Heroin, LSD, "  Crack" cocaine   Sexual activity: Not Currently  Other Topics Concern   Not on file  Social History Narrative   Not on file   Social Determinants of Health   Financial Resource Strain: Not on file  Food Insecurity: Not on file  Transportation Needs: Not on file  Physical Activity: Not on file  Stress: Not on file  Social Connections: Not on file  Intimate Partner Violence: Not on file      Review of Systems  Constitutional:  Negative for chills, fatigue and unexpected weight change.  HENT:  Negative for congestion, rhinorrhea, sneezing and sore throat.    Eyes:  Negative for redness.  Respiratory:  Positive for shortness of breath (is still using continuous oxygen.). Negative for cough, chest tightness and wheezing.   Cardiovascular:  Negative for chest pain and palpitations.  Gastrointestinal:  Negative for abdominal pain, constipation, diarrhea, nausea and vomiting.  Genitourinary:  Negative for dysuria and frequency.  Musculoskeletal:  Negative for arthralgias, back pain, joint swelling and neck pain.  Skin:  Negative for rash.  Neurological: Negative.  Negative for tremors and numbness.  Hematological:  Negative for adenopathy. Does not bruise/bleed easily.  Psychiatric/Behavioral:  Negative for behavioral problems (Depression), sleep disturbance and suicidal ideas. The patient is not nervous/anxious.    Vital Signs: BP 111/60   Pulse 70   Temp 98.2 F (36.8 C)   Resp 16   Ht 5' 8.5" (1.74 m)   Wt 189 lb 6.4 oz (85.9 kg)   SpO2 98% Comment: 2L oxygen  BMI 28.38 kg/m    Physical Exam Vitals reviewed.  Constitutional:      General: He is awake. He is not in acute distress.    Appearance: Normal appearance. He is well-developed, well-groomed and overweight. He is not ill-appearing.     Interventions: Nasal cannula in place.  HENT:     Head: Normocephalic and atraumatic.  Eyes:     Pupils: Pupils are equal, round, and reactive to light.  Cardiovascular:     Rate and Rhythm: Normal rate and regular rhythm.  Pulmonary:     Effort: Pulmonary effort is normal. No accessory muscle usage or respiratory distress.     Breath sounds: Normal air entry. Examination of the right-upper field reveals wheezing. Examination of the left-upper field reveals wheezing. Examination of the right-lower field reveals decreased breath sounds. Examination of the left-lower field reveals decreased breath sounds. Decreased breath sounds and wheezing present.  Neurological:     Mental Status: He is alert and oriented to person, place, and time.   Psychiatric:        Mood and Affect: Mood normal.        Behavior: Behavior normal. Behavior is cooperative.       Assessment/Plan: 1. Chronic obstructive pulmonary disease, unspecified COPD type (HCC) Spirometry improving, Anoro refills sent, follow up in 6 months.    2. Cigarette nicotine dependence with nicotine-induced disorder Patient working on this on their own and does not wish to discuss further at this time    3. SOB (shortness of breath) Spirometry done in office today, results improved from previous spiro, follow up in 6 months.  - Spirometry with Graph  General Counseling: Lytle Michaels understanding of the findings of todays visit and agrees with plan of treatment. I have discussed any further diagnostic evaluation that may be needed or ordered today. We also reviewed his medications today. he has been encouraged to call the office with any questions or concerns  that should arise related to todays visit.    Orders Placed This Encounter  Procedures   Spirometry with Graph    Meds ordered this encounter  Medications   umeclidinium-vilanterol (ANORO ELLIPTA) 62.5-25 MCG/ACT AEPB    Sig: Inhale 1 puff into the lungs daily.    Dispense:  60 each    Refill:  5    Return in about 6 months (around 05/25/2021) for F/U, pulmonary only Kaleia Longhi or [facility]SK.   Total time spent:20 Minutes Time spent includes review of chart, medications, test results, and follow up plan with the patient.   [place] Controlled Substance Database was reviewed by me.  This patient was seen by Sallyanne Kuster, FNP-C in collaboration with Dr. Beverely Risen as a part of collaborative care agreement.   Kari Montero R. Tedd Sias, MSN, FNP-C Internal medicine

## 2020-12-14 ENCOUNTER — Encounter: Payer: Self-pay | Admitting: Nurse Practitioner

## 2021-01-18 ENCOUNTER — Other Ambulatory Visit: Payer: Self-pay | Admitting: Internal Medicine

## 2021-01-18 DIAGNOSIS — J4489 Other specified chronic obstructive pulmonary disease: Secondary | ICD-10-CM

## 2021-01-18 DIAGNOSIS — J449 Chronic obstructive pulmonary disease, unspecified: Secondary | ICD-10-CM

## 2021-02-17 ENCOUNTER — Emergency Department
Admission: EM | Admit: 2021-02-17 | Discharge: 2021-02-17 | Disposition: A | Discharge status: Home / Self Care | Payer: Medicaid Other | Attending: Emergency Medicine | Admitting: Emergency Medicine

## 2021-02-17 ENCOUNTER — Other Ambulatory Visit: Payer: Self-pay

## 2021-02-17 ENCOUNTER — Encounter: Payer: Self-pay | Admitting: Emergency Medicine

## 2021-02-17 DIAGNOSIS — F1410 Cocaine abuse, uncomplicated: Secondary | ICD-10-CM | POA: Insufficient documentation

## 2021-02-17 DIAGNOSIS — F1090 Alcohol use, unspecified, uncomplicated: Secondary | ICD-10-CM | POA: Diagnosis present

## 2021-02-17 DIAGNOSIS — F1010 Alcohol abuse, uncomplicated: Secondary | ICD-10-CM | POA: Diagnosis not present

## 2021-02-17 DIAGNOSIS — Z79899 Other long term (current) drug therapy: Secondary | ICD-10-CM | POA: Insufficient documentation

## 2021-02-17 LAB — CBC
HCT: 44.6 % (ref 39.0–52.0)
Hemoglobin: 14.8 g/dL (ref 13.0–17.0)
MCH: 31 pg (ref 26.0–34.0)
MCHC: 33.2 g/dL (ref 30.0–36.0)
MCV: 93.5 fL (ref 80.0–100.0)
Platelets: 476 10*3/uL — ABNORMAL HIGH (ref 150–400)
RBC: 4.77 MIL/uL (ref 4.22–5.81)
RDW: 12 % (ref 11.5–15.5)
WBC: 10 10*3/uL (ref 4.0–10.5)
nRBC: 0 % (ref 0.0–0.2)

## 2021-02-17 LAB — URINE DRUG SCREEN, QUALITATIVE (ARMC ONLY)
Amphetamines, Ur Screen: NOT DETECTED
Barbiturates, Ur Screen: NOT DETECTED
Benzodiazepine, Ur Scrn: NOT DETECTED
Cannabinoid 50 Ng, Ur ~~LOC~~: NOT DETECTED
Cocaine Metabolite,Ur ~~LOC~~: NOT DETECTED
MDMA (Ecstasy)Ur Screen: NOT DETECTED
Methadone Scn, Ur: NOT DETECTED
Opiate, Ur Screen: NOT DETECTED
Phencyclidine (PCP) Ur S: NOT DETECTED
Tricyclic, Ur Screen: NOT DETECTED

## 2021-02-17 LAB — COMPREHENSIVE METABOLIC PANEL
ALT: 16 U/L (ref 0–44)
AST: 17 U/L (ref 15–41)
Albumin: 4 g/dL (ref 3.5–5.0)
Alkaline Phosphatase: 52 U/L (ref 38–126)
Anion gap: 11 (ref 5–15)
BUN: 12 mg/dL (ref 6–20)
CO2: 26 mmol/L (ref 22–32)
Calcium: 9.5 mg/dL (ref 8.9–10.3)
Chloride: 98 mmol/L (ref 98–111)
Creatinine, Ser: 0.8 mg/dL (ref 0.61–1.24)
GFR, Estimated: >60 mL/min (ref >60)
Glucose, Bld: 116 mg/dL — ABNORMAL HIGH (ref 70–99)
Potassium: 3.9 mmol/L (ref 3.5–5.1)
Sodium: 135 mmol/L (ref 135–145)
Total Bilirubin: 0.7 mg/dL (ref 0.3–1.2)
Total Protein: 7.9 g/dL (ref 6.5–8.1)

## 2021-02-17 LAB — ETHANOL: Alcohol, Ethyl (B): <10 mg/dL (ref <10)

## 2021-02-17 MED ORDER — CHLORDIAZEPOXIDE HCL 25 MG PO CAPS: ORAL_CAPSULE | ORAL | 0 refills | Status: AC

## 2021-02-17 NOTE — ED Triage Notes (Addendum)
Pt comes into the ED via POV c/o need for detox.  Pt states she needs help with drugs and alcohol.  Pt drinks a case of beer a day and is using cocaine.  Last use 2 days ago.  Pt denies any SI or HI.  Pt in NAD at this time with even and unlabored respirations.

## 2021-02-17 NOTE — ED Notes (Signed)
Verified correct patient and correct discharge papers given. Pt alert and oriented X 4, stable for discharge. RR even and unlabored, color WNL. Discussed discharge instructions and follow-up as directed. Discharge medications discussed, when prescribed. Pt had opportunity to ask questions, and RN available to provide patient and/or family education.   

## 2021-02-17 NOTE — ED Provider Notes (Signed)
[facility] Provider Note    Event Date/Time   First MD Initiated Contact with Patient 02/17/21 1040     (approximate)   History   Chief Complaint Detox   HPI  Clifford Burgess is a 51 y.o. male who identifies as male who presents to the ED requesting detox.  Patient reports that he drinks alcohol on a daily basis, typically has 8-12 beers with occasional liquor consumption.  He also endorses snorting cocaine on a regular basis.  His last drink was 2 days ago, which was also the last time he used cocaine.  He denies any IV drug use.  He reports going through mild alcohol withdrawal in the past but denies any history of seizures or DTs.  He is here with his sister, who has encouraged him to seek detox.  He denies any suicidal or homicidal ideation at this time, also denies any medical complaints.     Physical Exam   Triage Vital Signs: ED Triage Vitals  Enc Vitals Group     BP 02/17/21 1041 (!) 143/78     Pulse Rate 02/17/21 1041 84     Resp 02/17/21 1041 17     Temp 02/17/21 1041 98.1 F (36.7 C)     Temp Source 02/17/21 1041 Oral     SpO2 02/17/21 1041 98 %     Weight 02/17/21 1032 200 lb (90.7 kg)     Height 02/17/21 1032 5\' 8"  (1.727 m)     Head Circumference --      Peak Flow --      Pain Score 02/17/21 1032 0     Pain Loc --      Pain Edu? --      Excl. in GC? --     Most recent vital signs: Vitals:   02/17/21 1041 02/17/21 1212  BP: (!) 143/78 120/79  Pulse: 84 79  Resp: 17 16  Temp: 98.1 F (36.7 C)   SpO2: 98% 98%    Constitutional: Alert and oriented. Eyes: Conjunctivae are normal. Head: Atraumatic. Nose: No congestion/rhinnorhea. Mouth/Throat: Mucous membranes are moist.  Cardiovascular: Normal rate, regular rhythm. Grossly normal heart sounds.  2+ radial pulses bilaterally. Respiratory: Normal respiratory effort.  No retractions. Lungs CTAB. Gastrointestinal: Soft and nontender. No distention. Musculoskeletal: No  lower extremity tenderness nor edema.  Neurologic:  Normal speech and language. No gross focal neurologic deficits are appreciated.    ED Results / Procedures / Treatments   Labs (all labs ordered are listed, but only abnormal results are displayed) Labs Reviewed  COMPREHENSIVE METABOLIC PANEL - Abnormal; Notable for the following components:      Result Value   Glucose, Bld 116 (*)    All other components within normal limits  CBC - Abnormal; Notable for the following components:   Platelets 476 (*)    All other components within normal limits  ETHANOL  URINE DRUG SCREEN, QUALITATIVE ([facility] ONLY)    PROCEDURES:  Critical Care performed: No  Procedures   MEDICATIONS ORDERED IN ED: Medications - No data to display   IMPRESSION / MDM / ASSESSMENT AND PLAN / ED COURSE  I reviewed the triage vital signs and the nursing notes.                              51 y.o. adult with past medical history of bipolar disorder, alcohol abuse, COPD, and major depressive disorder who presents  to the ED requesting detox from alcohol and cocaine, denies any psychiatric or medical complaints.  Differential diagnosis includes, but is not limited to, alcohol withdrawal, electrolyte abnormality, cocaine use disorder.  Patient is nontoxic-appearing and in no acute distress, vital signs are unremarkable.  Labs are reassuring with no anemia or electrolyte abnormality, LFTs within normal limits.  Patient has no psychiatric complaints and is not interested in psychiatric consultation.  They have minimal alcohol withdrawal symptoms at this time.  She is appropriate for outpatient follow-up for potential detox, will be prescribed a course of Librium to assist with this and was provided with contact information for local detox facilities.  She was counseled to return to the ED for new or worsening symptoms, patient and family agree with plan.      FINAL CLINICAL IMPRESSION(S) / ED DIAGNOSES   Final  diagnoses:  Alcohol abuse  Cocaine use disorder (HCC)     Rx / DC Orders   ED Discharge Orders          Ordered    chlordiazePOXIDE (LIBRIUM) 25 MG capsule        02/17/21 1150             Note:  This document was prepared using Dragon voice recognition software and may include unintentional dictation errors.   Chesley Noon, MD 02/17/21 [id number]

## 2021-02-21 ENCOUNTER — Other Ambulatory Visit: Payer: Self-pay | Admitting: Internal Medicine

## 2021-02-21 DIAGNOSIS — K219 Gastro-esophageal reflux disease without esophagitis: Secondary | ICD-10-CM

## 2021-02-25 ENCOUNTER — Ambulatory Visit: Payer: Medicaid Other | Admitting: Physician Assistant

## 2021-02-25 ENCOUNTER — Encounter: Payer: Self-pay | Admitting: Physician Assistant

## 2021-02-25 ENCOUNTER — Other Ambulatory Visit: Payer: Self-pay

## 2021-02-25 DIAGNOSIS — Z9981 Dependence on supplemental oxygen: Secondary | ICD-10-CM | POA: Diagnosis not present

## 2021-02-25 DIAGNOSIS — J449 Chronic obstructive pulmonary disease, unspecified: Secondary | ICD-10-CM

## 2021-02-25 DIAGNOSIS — Z79890 Hormone replacement therapy (postmenopausal): Secondary | ICD-10-CM

## 2021-02-25 DIAGNOSIS — R5383 Other fatigue: Secondary | ICD-10-CM

## 2021-02-25 DIAGNOSIS — F1990 Other psychoactive substance use, unspecified, uncomplicated: Secondary | ICD-10-CM

## 2021-02-25 NOTE — Progress Notes (Signed)
[facility] [place], [place] [address]  Internal MEDICINE  Office Visit Note  Patient Name: Clifford Burgess  [id number]  [id number]  Date of Service: 02/25/2021  Chief Complaint  Patient presents with   Follow-up   Gastroesophageal Reflux   COPD   Asthma    HPI Pt is here for routine follow up -had a relapse recently after stressful event with mother's health scare. Reports she is doing better now, but that she flat lined at one point. -She went to the ED on 02/17/21 requesting detox. She had heavy alcohol consumption and has snorted cocaine. She had used both 2 days prior to ED visit. Labs and exam were reassuring and was discharged and given OP detox information and started on Librium. She declined any psychiatric evaluation at the time. -The patient then went to [facility] ED on 02/18/21 for detox as well after she tried going to [facility] for rehab and was instructed to go to [facility] for rehab management? She had not had any further alcohol or cocaine and was again deemed to be low risk for severe withdrawal and was treated for headache and instructed to start Librium taper -[facility] did give her crisis line info in case she needs it -no other substances or alcohol since ED visit and reports she is doing much better. Again declines any psych or detox referrals, reports she will let office know if this changes -Does mention she is interested in obtaining portable oxygen and will discuss this at upcoming pulmonary visit  Current Medication: Outpatient Encounter Medications as of 02/25/2021  Medication Sig   albuterol (PROVENTIL) (2.5 MG/3ML) 0.083% nebulizer solution Take 3 mLs (2.5 mg total) by nebulization every 6 (six) hours as needed for wheezing or shortness of breath.   albuterol (VENTOLIN HFA) 108 (90 Base) MCG/ACT inhaler Inhale 2 puffs into the lungs every 6 (six) hours as needed for wheezing or shortness of breath.   aspirin EC 81 MG tablet Take 81 mg by mouth daily.    estradiol (ESTRACE) 2 MG tablet Take 2 mg by mouth 3 (three) times daily.   ipratropium-albuterol (DUONEB) 0.5-2.5 (3) MG/3ML SOLN TAKE 3 MLS BY NEBULIZATION EVERY 6 (SIX) HOURS AS NEEDED.   omeprazole (PRILOSEC) 20 MG capsule TAKE 1 CAPSULE BY MOUTH EVERY DAY   OXYGEN Inhale 2 L into the lungs. At night   progesterone (PROMETRIUM) 100 MG capsule SMARTSIG:1 Capsule(s) By Mouth Every Evening   spironolactone (ALDACTONE) 50 MG tablet Take 50 mg by mouth 2 (two) times daily.   umeclidinium-vilanterol (ANORO ELLIPTA) 62.5-25 MCG/ACT AEPB Inhale 1 puff into the lungs daily.   No facility-administered encounter medications on file as of 02/25/2021.    Surgical History: Past Surgical History:  Procedure Laterality Date   HERNIA REPAIR      Medical History: Past Medical History:  Diagnosis Date   Asthma    COPD (chronic obstructive pulmonary disease) (HCC)    GERD (gastroesophageal reflux disease)     Family History: Family History  Problem Relation Age of Onset   Hypertension Father     Social History   Socioeconomic History   Marital status: Divorced    Spouse name: Not on file   Number of children: 2   Years of education: Not on file   Highest education level: 8th grade  Occupational History   Not on file  Tobacco Use   Smoking status: Every Day    Packs/day: 1.00    Years: 36.00  Pack years: 36.00    Types: Cigarettes   Smokeless tobacco: Former    Types: Nurse, children's Use: Never used  Substance and Sexual Activity   Alcohol use: Yes    Alcohol/week: 2.0 standard drinks    Types: 2 Cans of beer per week   Drug use: Not Currently    Types: Cocaine, Marijuana, Heroin, LSD, "Crack" cocaine   Sexual activity: Not Currently  Other Topics Concern   Not on file  Social History Narrative   Not on file   Social Determinants of Health   Financial Resource Strain: Not on file  Food Insecurity: Not on file  Transportation Needs: Not on file   Physical Activity: Not on file  Stress: Not on file  Social Connections: Not on file  Intimate Partner Violence: Not on file      Review of Systems  Constitutional:  Negative for chills, fatigue and unexpected weight change.  HENT:  Negative for congestion, postnasal drip, rhinorrhea, sneezing and sore throat.   Eyes:  Negative for redness.  Respiratory:  Positive for shortness of breath. Negative for cough, chest tightness and wheezing.   Cardiovascular:  Negative for chest pain and palpitations.  Gastrointestinal:  Negative for abdominal pain, constipation, diarrhea, nausea and vomiting.  Genitourinary:  Negative for dysuria and frequency.  Musculoskeletal:  Negative for arthralgias, back pain, joint swelling and neck pain.  Skin:  Negative for rash.  Neurological: Negative.  Negative for tremors and numbness.  Hematological:  Negative for adenopathy. Does not bruise/bleed easily.  Psychiatric/Behavioral:  Negative for behavioral problems (Depression), sleep disturbance and suicidal ideas. The patient is not nervous/anxious.    Vital Signs: BP 112/72    Pulse 78    Temp 97.6 F (36.4 C)    Resp 16    Ht 5\' 8"  (1.727 m)    Wt 194 lb 3.2 oz (88.1 kg)    SpO2 96%    BMI 29.53 kg/m    Physical Exam Vitals and nursing note reviewed.  Constitutional:      General: He is not in acute distress.    Appearance: He is well-developed. He is not diaphoretic.  HENT:     Head: Normocephalic and atraumatic.     Mouth/Throat:     Pharynx: No oropharyngeal exudate.  Eyes:     Pupils: Pupils are equal, round, and reactive to light.  Neck:     Thyroid: No thyromegaly.     Vascular: No JVD.     Trachea: No tracheal deviation.  Cardiovascular:     Rate and Rhythm: Normal rate and regular rhythm.     Heart sounds: Normal heart sounds. No murmur heard.   No friction rub. No gallop.  Pulmonary:     Effort: Pulmonary effort is normal. No respiratory distress.     Breath sounds: No wheezing  or rales.  Chest:     Chest wall: No tenderness.  Abdominal:     General: Bowel sounds are normal.     Palpations: Abdomen is soft.  Musculoskeletal:        General: Normal range of motion.     Cervical back: Normal range of motion and neck supple.  Lymphadenopathy:     Cervical: No cervical adenopathy.  Skin:    General: Skin is warm and dry.  Neurological:     Mental Status: He is alert and oriented to person, place, and time.     Cranial Nerves: No cranial nerve deficit.  Psychiatric:        Behavior: Behavior normal.        Thought Content: Thought content normal.        Judgment: Judgment normal.       Assessment/Plan: 1. Polysubstance use disorder Discussed stopping further alcohol and cocaine use. Patient has gone over a week without either now. Advised to reconsider outpatient rehab/detox and seeking psychiatric counseling. Patient will follow up with [facility] as needed and notify office for any referral needs  2. Hormone replacement therapy (HRT) Followed by outside provider for HRT and monitoring   3. Chronic obstructive pulmonary disease, unspecified COPD type ([place]) Followed by pulmonology  4. Oxygen dependent Continue oxygen, will discuss portable oxygen at pulmonology visit  5. Other fatigue - PSA Total (Reflex To Free) - TSH + free T4 - Lipid Panel With LDL/HDL Ratio   General Counseling: Rama verbalizes understanding of the findings of todays visit and agrees with plan of treatment. I have discussed any further diagnostic evaluation that may be needed or ordered today. We also reviewed his medications today. he has been encouraged to call the office with any questions or concerns that should arise related to todays visit.    Orders Placed This Encounter  Procedures   PSA Total (Reflex To Free)   TSH + free T4   Lipid Panel With LDL/HDL Ratio    No orders of the defined types were placed in this encounter.   This patient was seen by Drema Dallas, PA-C in collaboration with Dr. Clayborn Bigness as a part of collaborative care agreement.   Total time spent:30 Minutes Time spent includes review of chart, medications, test results, and follow up plan with the patient.      Dr Lavera Guise Internal medicine

## 2021-03-19 ENCOUNTER — Other Ambulatory Visit: Payer: Self-pay | Admitting: Internal Medicine

## 2021-03-19 DIAGNOSIS — J4489 Other specified chronic obstructive pulmonary disease: Secondary | ICD-10-CM

## 2021-03-19 DIAGNOSIS — J449 Chronic obstructive pulmonary disease, unspecified: Secondary | ICD-10-CM

## 2021-04-04 ENCOUNTER — Telehealth: Payer: Self-pay

## 2021-04-04 ENCOUNTER — Ambulatory Visit: Payer: Medicaid Other | Admitting: Physician Assistant

## 2021-04-04 ENCOUNTER — Encounter: Payer: Self-pay | Admitting: Physician Assistant

## 2021-04-04 ENCOUNTER — Other Ambulatory Visit: Payer: Self-pay

## 2021-04-04 DIAGNOSIS — J449 Chronic obstructive pulmonary disease, unspecified: Secondary | ICD-10-CM

## 2021-04-04 DIAGNOSIS — Z9981 Dependence on supplemental oxygen: Secondary | ICD-10-CM

## 2021-04-04 DIAGNOSIS — R0602 Shortness of breath: Secondary | ICD-10-CM | POA: Diagnosis not present

## 2021-04-04 MED ORDER — ALBUTEROL SULFATE HFA 108 (90 BASE) MCG/ACT IN AERS: 2.0000 | INHALATION_SPRAY | Freq: Four times a day (QID) | RESPIRATORY_TRACT | 3 refills | Status: DC | PRN

## 2021-04-04 NOTE — Progress Notes (Signed)
[facility] ?[address] ?[place], [place] [id number] ? ?Pulmonary Sleep Medicine  ? ?Office Visit Note ? ?Patient Name: Clifford Burgess ?DOB: 12-28-1970 ?MRN [id number] ? ?Date of Service: 04/05/2021 ? ?Complaints/HPI: Pt is here for pulmonary follow up for oxygen recertification. 6 minute walk will be done in order to prove continued need for supplemental oxygen use. Is also interested in portable oxygen. Patient admits to feeling SOB without oxygen when exerting herself. Normally wears 2L oxygen at all times. ? ?ROS ? ?General: (-) fever, (-) chills, (-) night sweats, (-) weakness ?Skin: (-) rashes, (-) itching,. ?Eyes: (-) visual changes, (-) redness, (-) itching. ?Nose and Sinuses: (-) nasal stuffiness or itchiness, (-) postnasal drip, (-) nosebleeds, (-) sinus trouble. ?Mouth and Throat: (-) sore throat, (-) hoarseness. ?Neck: (-) swollen glands, (-) enlarged thyroid, (-) neck pain. ?Respiratory: - cough, (-) bloody sputum, + shortness of breath, - wheezing. ?Cardiovascular: - ankle swelling, (-) chest pain. ?Lymphatic: (-) lymph node enlargement. ?Neurologic: (-) numbness, (-) tingling. ?Psychiatric: (-) anxiety, (-) depression ? ? ?Current Medication: ?Outpatient Encounter Medications as of 04/04/2021  ?Medication Sig  ? albuterol (PROVENTIL) (2.5 MG/3ML) 0.083% nebulizer solution Take 3 mLs (2.5 mg total) by nebulization every 6 (six) hours as needed for wheezing or shortness of breath.  ? aspirin EC 81 MG tablet Take 81 mg by mouth daily.  ? estradiol (ESTRACE) 2 MG tablet Take 2 mg by mouth 3 (three) times daily.  ? ipratropium-albuterol (DUONEB) 0.5-2.5 (3) MG/3ML SOLN TAKE 3 MLS BY NEBULIZATION EVERY 6 (SIX) HOURS AS NEEDED.  ? omeprazole (PRILOSEC) 20 MG capsule TAKE 1 CAPSULE BY MOUTH EVERY DAY  ? OXYGEN Inhale 2 L into the lungs. At night pt uses 24 hrs from [facility]  ? progesterone (PROMETRIUM) 100 MG capsule SMARTSIG:1 Capsule(s) By Mouth Every Evening  ? spironolactone  (ALDACTONE) 50 MG tablet Take 50 mg by mouth 2 (two) times daily.  ? umeclidinium-vilanterol (ANORO ELLIPTA) 62.5-25 MCG/ACT AEPB Inhale 1 puff into the lungs daily.  ? [DISCONTINUED] albuterol (VENTOLIN HFA) 108 (90 Base) MCG/ACT inhaler Inhale 2 puffs into the lungs every 6 (six) hours as needed for wheezing or shortness of breath.  ? albuterol (VENTOLIN HFA) 108 (90 Base) MCG/ACT inhaler Inhale 2 puffs into the lungs every 6 (six) hours as needed for wheezing or shortness of breath.  ? ?No facility-administered encounter medications on file as of 04/04/2021.  ? ? ?Surgical History: ?Past Surgical History:  ?Procedure Laterality Date  ? HERNIA REPAIR    ? ? ?Medical History: ?Past Medical History:  ?Diagnosis Date  ? Asthma   ? COPD (chronic obstructive pulmonary disease) (HCC)   ? GERD (gastroesophageal reflux disease)   ? ? ?Family History: ?Family History  ?Problem Relation Age of Onset  ? Hypertension Father   ? ? ?Social History: ?Social History  ? ?Socioeconomic History  ? Marital status: Divorced  ?  Spouse name: Not on file  ? Number of children: 2  ? Years of education: Not on file  ? Highest education level: 8th grade  ?Occupational History  ? Not on file  ?Tobacco Use  ? Smoking status: Every Day  ?  Packs/day: 1.00  ?  Years: 36.00  ?  Pack years: 36.00  ?  Types: Cigarettes  ? Smokeless tobacco: Former  ?  Types: Chew  ?Vaping Use  ? Vaping Use: Never used  ?Substance and Sexual Activity  ? Alcohol use: Yes  ?  Alcohol/week: 2.0 standard  drinks  ?  Types: 2 Cans of beer per week  ? Drug use: Not Currently  ?  Types: Cocaine, Marijuana, Heroin, LSD, "Crack" cocaine  ? Sexual activity: Not Currently  ?Other Topics Concern  ? Not on file  ?Social History Narrative  ? Not on file  ? ?Social Determinants of Health  ? ?Financial Resource Strain: Not on file  ?Food Insecurity: Not on file  ?Transportation Needs: Not on file  ?Physical Activity: Not on file  ?Stress: Not on file  ?Social Connections: Not on  file  ?Intimate Partner Violence: Not on file  ? ? ?Vital Signs: ?Blood pressure 138/76, pulse 70, temperature 98.4 ?F (36.9 ?C), resp. rate 16, height 5\' 8"  (1.727 m), weight 192 lb 12.8 oz (87.5 kg), SpO2 94 %. ? ?Examination: ?General Appearance: The patient is well-developed, well-nourished, and in no distress. ?Skin: Gross inspection of skin unremarkable. ?Head: normocephalic, no gross deformities. ?Eyes: no gross deformities noted. ?ENT: ears appear grossly normal no exudates. ?Neck: Supple. No thyromegaly. No LAD. ?Respiratory: Lungs clear to auscultation bilaterally. ?Cardiovascular: Normal S1 and S2 without murmur or rub. ?Extremities: No cyanosis. pulses are equal. ?Neurologic: Alert and oriented. No involuntary movements. ? ?LABS: ?Recent Results (from the past 2160 hour(s))  ?Comprehensive metabolic panel     Status: Abnormal  ? Collection Time: 02/17/21 10:35 AM  ?Result Value Ref Range  ? Sodium 135 135 - 145 mmol/L  ? Potassium 3.9 3.5 - 5.1 mmol/L  ? Chloride 98 98 - 111 mmol/L  ? CO2 26 22 - 32 mmol/L  ? Glucose, Bld 116 (H) 70 - 99 mg/dL  ?  Comment: Glucose reference range applies only to samples taken after fasting for at least 8 hours.  ? BUN 12 6 - 20 mg/dL  ? Creatinine, Ser 0.80 0.61 - 1.24 mg/dL  ? Calcium 9.5 8.9 - 10.3 mg/dL  ? Total Protein 7.9 6.5 - 8.1 g/dL  ? Albumin 4.0 3.5 - 5.0 g/dL  ? AST 17 15 - 41 U/L  ? ALT 16 0 - 44 U/L  ? Alkaline Phosphatase 52 38 - 126 U/L  ? Total Bilirubin 0.7 0.3 - 1.2 mg/dL  ? GFR, Estimated >60 >60 mL/min  ?  Comment: (NOTE) ?Calculated using the CKD-EPI Creatinine Equation (2021) ?  ? Anion gap 11 5 - 15  ?  Comment: Performed at [facility], [address], [place], [place]  ?Ethanol     Status: None  ? Collection Time: 02/17/21 10:35 AM  ?Result Value Ref Range  ? Alcohol, Ethyl (B) <10 <10 mg/dL  ?  Comment: (NOTE) ?Lowest detectable limit for serum alcohol is 10 mg/dL. ? ?For medical purposes only. ?Performed at [facility], [address] [place], ?[place] ?  ?cbc     Status: Abnormal  ? Collection Time: 02/17/21 10:35 AM  ?Result Value Ref Range  ? WBC 10.0 4.0 - 10.5 K/uL  ? RBC 4.77 4.22 - 5.81 MIL/uL  ? Hemoglobin 14.8 13.0 - 17.0 g/dL  ? HCT 44.6 39.0 - 52.0 %  ? MCV 93.5 80.0 - 100.0 fL  ? MCH 31.0 26.0 - 34.0 pg  ? MCHC 33.2 30.0 - 36.0 g/dL  ? RDW 12.0 11.5 - 15.5 %  ? Platelets 476 (H) 150 - 400 K/uL  ? nRBC 0.0 0.0 - 0.2 %  ?  Comment: Performed at [facility], [address] [place], [place]  ?Urine Drug Screen, Qualitative  Status: None  ? Collection Time: 02/17/21 10:43 AM  ?Result Value Ref Range  ? Tricyclic, Ur Screen NONE DETECTED NONE DETECTED  ? Amphetamines, Ur Screen NONE DETECTED NONE DETECTED  ? MDMA (Ecstasy)Ur Screen NONE DETECTED NONE DETECTED  ? Cocaine Metabolite,Ur [place] NONE DETECTED NONE DETECTED  ? Opiate, Ur Screen NONE DETECTED NONE DETECTED  ? Phencyclidine (PCP) Ur S NONE DETECTED NONE DETECTED  ? Cannabinoid 50 Ng, Ur [place] NONE DETECTED NONE DETECTED  ? Barbiturates, Ur Screen NONE DETECTED NONE DETECTED  ? Benzodiazepine, Ur Scrn NONE DETECTED NONE DETECTED  ? Methadone Scn, Ur NONE DETECTED NONE DETECTED  ?  Comment: (NOTE) ?Tricyclics + metabolites, urine    Cutoff 1000 ng/mL ?Amphetamines + metabolites, urine  Cutoff 1000 ng/mL ?MDMA (Ecstasy), urine              Cutoff 500 ng/mL ?Cocaine Metabolite, urine          Cutoff 300 ng/mL ?Opiate + metabolites, urine        Cutoff 300 ng/mL ?Phencyclidine (PCP), urine         Cutoff 25 ng/mL ?Cannabinoid, urine                 Cutoff 50 ng/mL ?Barbiturates + metabolites, urine  Cutoff 200 ng/mL ?Benzodiazepine, urine              Cutoff 200 ng/mL ?Methadone, urine                   Cutoff 300 ng/mL ? ?The urine drug screen provides only a preliminary, unconfirmed ?analytical test result and should not be used for non-medical ?purposes. Clinical consideration and professional judgment should ?be applied to  any positive drug screen result due to possible ?interfering substances. A more specific alternate chemical method ?must be used in order to obtain a confirmed analytical result. ?Gas chromatography / mass spectro

## 2021-04-04 NOTE — Telephone Encounter (Signed)
Sent community message to sarah at [facility] to complete order for oxygen 24 hrs recertification ?

## 2021-04-05 NOTE — Patient Instructions (Signed)

## 2021-04-26 ENCOUNTER — Encounter: Payer: Self-pay | Admitting: Internal Medicine

## 2021-05-26 ENCOUNTER — Ambulatory Visit: Payer: Medicaid Other | Admitting: Internal Medicine

## 2021-06-03 ENCOUNTER — Telehealth: Payer: Self-pay

## 2021-06-03 NOTE — Telephone Encounter (Signed)
Order for Nebulizer signed by provider and placed in  [facility] folder.

## 2021-06-23 ENCOUNTER — Ambulatory Visit: Payer: Medicaid Other | Admitting: Physician Assistant

## 2021-06-23 ENCOUNTER — Encounter: Payer: Self-pay | Admitting: Physician Assistant

## 2021-06-23 DIAGNOSIS — J449 Chronic obstructive pulmonary disease, unspecified: Secondary | ICD-10-CM | POA: Diagnosis not present

## 2021-06-23 DIAGNOSIS — F1990 Other psychoactive substance use, unspecified, uncomplicated: Secondary | ICD-10-CM

## 2021-06-23 DIAGNOSIS — Z9981 Dependence on supplemental oxygen: Secondary | ICD-10-CM

## 2021-06-23 DIAGNOSIS — Z79890 Hormone replacement therapy (postmenopausal): Secondary | ICD-10-CM | POA: Diagnosis not present

## 2021-06-23 MED ORDER — ALBUTEROL SULFATE HFA 108 (90 BASE) MCG/ACT IN AERS: 2.0000 | INHALATION_SPRAY | Freq: Four times a day (QID) | RESPIRATORY_TRACT | 3 refills | Status: DC | PRN

## 2021-06-23 NOTE — Progress Notes (Signed)
[facility] [address] [place], [place] [address]  Internal MEDICINE  Office Visit Note  Patient Name: Clifford Burgess  [id number]  [id number]  Date of Service: 06/29/2021  Chief Complaint  Patient presents with   Follow-up   Gastroesophageal Reflux   COPD   Asthma   Medication Refill    Needs rescue inhaler - only has 5-6 uses left    HPI Pt is here for routine follow up -Sleeping ok -Breathing has been ok except for a few days ago with the weather/air quality and had some SOB and had to raise supplemental oxygen to 3L to get sats up, but has been better now and is back on normal 2L -Does need albuterol refills, is followed by pulmonology -Did drink last night, split 30 beers with a friend.  Had a relapse of substance abuse in Feb and tried to get help for this but reports being given meds to take and didn't really like that  -Has been to [facility] before, but they didn't have any beds at the time. Will follow back up with [facility] regarding addiction and seeking help to detox safely. Has not wanted psych referral, but will let office know if this changes -Still has not had prior labs done  Current Medication: Outpatient Encounter Medications as of 06/23/2021  Medication Sig   albuterol (PROVENTIL) (2.5 MG/3ML) 0.083% nebulizer solution Take 3 mLs (2.5 mg total) by nebulization every 6 (six) hours as needed for wheezing or shortness of breath.   aspirin EC 81 MG tablet Take 81 mg by mouth daily.   estradiol (ESTRACE) 2 MG tablet Take 2 mg by mouth 3 (three) times daily.   ipratropium-albuterol (DUONEB) 0.5-2.5 (3) MG/3ML SOLN TAKE 3 MLS BY NEBULIZATION EVERY 6 (SIX) HOURS AS NEEDED.   omeprazole (PRILOSEC) 20 MG capsule TAKE 1 CAPSULE BY MOUTH EVERY DAY   OXYGEN Inhale 2 L into the lungs. At night pt uses 24 hrs from [facility]   progesterone (PROMETRIUM) 100 MG capsule SMARTSIG:1 Capsule(s) By Mouth Every Evening   spironolactone (ALDACTONE) 50 MG tablet Take 50  mg by mouth 2 (two) times daily.   umeclidinium-vilanterol (ANORO ELLIPTA) 62.5-25 MCG/ACT AEPB Inhale 1 puff into the lungs daily.   [DISCONTINUED] albuterol (VENTOLIN HFA) 108 (90 Base) MCG/ACT inhaler Inhale 2 puffs into the lungs every 6 (six) hours as needed for wheezing or shortness of breath.   albuterol (VENTOLIN HFA) 108 (90 Base) MCG/ACT inhaler Inhale 2 puffs into the lungs every 6 (six) hours as needed for wheezing or shortness of breath.   No facility-administered encounter medications on file as of 06/23/2021.    Surgical History: Past Surgical History:  Procedure Laterality Date   HERNIA REPAIR      Medical History: Past Medical History:  Diagnosis Date   Asthma    COPD (chronic obstructive pulmonary disease) (HCC)    GERD (gastroesophageal reflux disease)     Family History: Family History  Problem Relation Age of Onset   Hypertension Father     Social History   Socioeconomic History   Marital status: Divorced    Spouse name: Not on file   Number of children: 2   Years of education: Not on file   Highest education level: 8th grade  Occupational History   Not on file  Tobacco Use   Smoking status: Every Day    Packs/day: 1.00    Years: 36.00    Total pack years: 36.00    Types:  Cigarettes   Smokeless tobacco: Former    Types: Chew   Tobacco comments:    1 pack a day  Vaping Use   Vaping Use: Never used  Substance and Sexual Activity   Alcohol use: Yes    Alcohol/week: 2.0 standard drinks of alcohol    Types: 2 Cans of beer per week   Drug use: Not Currently    Types: Cocaine, Marijuana, Heroin, LSD, "Crack" cocaine   Sexual activity: Not Currently  Other Topics Concern   Not on file  Social History Narrative   Not on file   Social Determinants of Health   Financial Resource Strain: High Risk (11/27/2017)   Overall Financial Resource Strain (CARDIA)    Difficulty of Paying Living Expenses: Hard  Food Insecurity: Food Insecurity Present  (11/27/2017)   Hunger Vital Sign    Worried About Running Out of Food in the Last Year: Often true    Ran Out of Food in the Last Year: Often true  Transportation Needs: No Transportation Needs (11/27/2017)   PRAPARE - Administrator, Civil Service (Medical): No    Lack of Transportation (Non-Medical): No  Physical Activity: Inactive (11/27/2017)   Exercise Vital Sign    Days of Exercise per Week: 0 days    Minutes of Exercise per Session: 0 min  Stress: No Stress Concern Present (11/27/2017)   [employer] of Occupational Health - Occupational Stress Questionnaire    Feeling of Stress : Not at all  Social Connections: Unknown (11/27/2017)   Social Connection and Isolation Panel [NHANES]    Frequency of Communication with Friends and Family: Not on file    Frequency of Social Gatherings with Friends and Family: Not on file    Attends Religious Services: Never    Active Member of Clubs or Organizations: No    Attends Banker Meetings: Never    Marital Status: Divorced  Catering manager Violence: Unknown (11/27/2017)   Humiliation, Afraid, Rape, and Kick questionnaire    Fear of Current or Ex-Partner: Not on file    Emotionally Abused: No    Physically Abused: No    Sexually Abused: No      Review of Systems  Constitutional:  Negative for chills, fatigue and unexpected weight change.  HENT:  Negative for congestion, postnasal drip, rhinorrhea, sneezing and sore throat.   Eyes:  Negative for redness.  Respiratory:  Positive for shortness of breath. Negative for cough, chest tightness and wheezing.   Cardiovascular:  Negative for chest pain and palpitations.  Gastrointestinal:  Negative for abdominal pain, constipation, diarrhea, nausea and vomiting.  Genitourinary:  Negative for dysuria and frequency.  Musculoskeletal:  Negative for arthralgias, back pain, joint swelling and neck pain.  Skin:  Negative for rash.  Neurological: Negative.  Negative  for tremors and numbness.  Hematological:  Negative for adenopathy. Does not bruise/bleed easily.  Psychiatric/Behavioral:  Negative for behavioral problems (Depression), sleep disturbance and suicidal ideas. The patient is not nervous/anxious.     Vital Signs: BP 122/70   Pulse 70   Temp 98.4 F (36.9 C)   Resp 16   Ht 5\' 8"  (1.727 m)   Wt 190 lb (86.2 kg)   SpO2 99%   BMI 28.89 kg/m    Physical Exam Vitals and nursing note reviewed.  Constitutional:      General: He is not in acute distress.    Appearance: He is well-developed. He is not diaphoretic.  HENT:  Head: Normocephalic and atraumatic.     Mouth/Throat:     Pharynx: No oropharyngeal exudate.  Eyes:     Pupils: Pupils are equal, round, and reactive to light.  Neck:     Thyroid: No thyromegaly.     Vascular: No JVD.     Trachea: No tracheal deviation.  Cardiovascular:     Rate and Rhythm: Normal rate and regular rhythm.     Heart sounds: Normal heart sounds. No murmur heard.    No friction rub. No gallop.  Pulmonary:     Effort: Pulmonary effort is normal. No respiratory distress.     Breath sounds: No wheezing or rales.  Chest:     Chest wall: No tenderness.  Abdominal:     General: Bowel sounds are normal.     Palpations: Abdomen is soft.  Musculoskeletal:        General: Normal range of motion.     Cervical back: Normal range of motion and neck supple.  Lymphadenopathy:     Cervical: No cervical adenopathy.  Skin:    General: Skin is warm and dry.  Neurological:     Mental Status: He is alert and oriented to person, place, and time.     Cranial Nerves: No cranial nerve deficit.  Psychiatric:        Behavior: Behavior normal.        Thought Content: Thought content normal.        Judgment: Judgment normal.        Assessment/Plan: 1. Chronic obstructive pulmonary disease, unspecified COPD type (HCC) Followed by pulmonology, refill sent - albuterol (VENTOLIN HFA) 108 (90 Base) MCG/ACT  inhaler; Inhale 2 puffs into the lungs every 6 (six) hours as needed for wheezing or shortness of breath.  Dispense: 18 g; Refill: 3  2. Polysubstance use disorder Will follow back up with [facility] regarding addiction counseling and possible detox program. Will let office know if any referral desired.  3. Oxygen dependent Continue oxygen as before  4. Hormone replacement therapy (HRT) Followed by outside provider for HRT and monitoring   General Counseling: venancio chenier understanding of the findings of todays visit and agrees with plan of treatment. I have discussed any further diagnostic evaluation that may be needed or ordered today. We also reviewed his medications today. he has been encouraged to call the office with any questions or concerns that should arise related to todays visit.    No orders of the defined types were placed in this encounter.   Meds ordered this encounter  Medications   albuterol (VENTOLIN HFA) 108 (90 Base) MCG/ACT inhaler    Sig: Inhale 2 puffs into the lungs every 6 (six) hours as needed for wheezing or shortness of breath.    Dispense:  18 g    Refill:  3    Ok to fill proair    This patient was seen by Lynn Ito, PA-C in collaboration with Dr. Beverely Risen as a part of collaborative care agreement.   Total time spent:30 Minutes Time spent includes review of chart, medications, test results, and follow up plan with the patient.      Dr Lyndon Code Internal medicine

## 2021-06-24 ENCOUNTER — Ambulatory Visit: Payer: Medicaid Other | Admitting: Physician Assistant

## 2021-07-18 ENCOUNTER — Other Ambulatory Visit: Payer: Self-pay

## 2021-07-18 DIAGNOSIS — R824 Acetonuria: Secondary | ICD-10-CM | POA: Diagnosis not present

## 2021-07-18 DIAGNOSIS — R1031 Right lower quadrant pain: Secondary | ICD-10-CM | POA: Diagnosis present

## 2021-07-18 DIAGNOSIS — J449 Chronic obstructive pulmonary disease, unspecified: Secondary | ICD-10-CM | POA: Diagnosis not present

## 2021-07-18 DIAGNOSIS — K523 Indeterminate colitis: Secondary | ICD-10-CM | POA: Diagnosis not present

## 2021-07-18 LAB — URINALYSIS, ROUTINE W REFLEX MICROSCOPIC
Bacteria, UA: NONE SEEN
Glucose, UA: NEGATIVE mg/dL
Hgb urine dipstick: NEGATIVE
Ketones, ur: 5 mg/dL — AB
Leukocytes,Ua: NEGATIVE
Nitrite: NEGATIVE
Protein, ur: 100 mg/dL — AB
Specific Gravity, Urine: 1.032 — ABNORMAL HIGH (ref 1.005–1.030)
Squamous Epithelial / HPF: NONE SEEN (ref 0–5)
WBC, UA: NONE SEEN WBC/hpf (ref 0–5)
pH: 5 (ref 5.0–8.0)

## 2021-07-18 LAB — CBC
HCT: 45.1 % (ref 39.0–52.0)
Hemoglobin: 14.7 g/dL (ref 13.0–17.0)
MCH: 29.5 pg (ref 26.0–34.0)
MCHC: 32.6 g/dL (ref 30.0–36.0)
MCV: 90.4 fL (ref 80.0–100.0)
Platelets: 346 10*3/uL (ref 150–400)
RBC: 4.99 MIL/uL (ref 4.22–5.81)
RDW: 12.6 % (ref 11.5–15.5)
WBC: 10.1 10*3/uL (ref 4.0–10.5)
nRBC: 0 % (ref 0.0–0.2)

## 2021-07-18 LAB — LIPASE, BLOOD: Lipase: 22 U/L (ref 11–51)

## 2021-07-18 LAB — COMPREHENSIVE METABOLIC PANEL
ALT: 17 U/L (ref 0–44)
AST: 19 U/L (ref 15–41)
Albumin: 3.9 g/dL (ref 3.5–5.0)
Alkaline Phosphatase: 51 U/L (ref 38–126)
Anion gap: 10 (ref 5–15)
BUN: 12 mg/dL (ref 6–20)
CO2: 23 mmol/L (ref 22–32)
Calcium: 9 mg/dL (ref 8.9–10.3)
Chloride: 99 mmol/L (ref 98–111)
Creatinine, Ser: 0.91 mg/dL (ref 0.61–1.24)
GFR, Estimated: >60 mL/min (ref >60)
Glucose, Bld: 122 mg/dL — ABNORMAL HIGH (ref 70–99)
Potassium: 4.1 mmol/L (ref 3.5–5.1)
Sodium: 132 mmol/L — ABNORMAL LOW (ref 135–145)
Total Bilirubin: 0.2 mg/dL — ABNORMAL LOW (ref 0.3–1.2)
Total Protein: 7.4 g/dL (ref 6.5–8.1)

## 2021-07-18 NOTE — ED Provider Triage Note (Signed)
Emergency Medicine Provider Triage Evaluation Note  Clifford Burgess , a 51 y.o. adult  was evaluated in triage.  Pt complains of diffuse lower abdominal pain.  Patient presents to the ED with 2 days of lower abdominal pain.  Diffusely across the lower abdomen.  Does have a history of hernia.  Patient has had nausea, vomiting, diarrhea.  No urinary changes.  Review of Systems  Positive: Lower abdominal pain, diarrhea, nausea, vomiting Negative: Fevers, hematic emesis or bloody stools, dysuria, polyuria, hematuria  Physical Exam  BP 109/67 (BP Location: Right Arm)   Pulse 90   Temp 98.1 F (36.7 C) (Oral)   Resp 18   Ht 5\' 8"  (1.727 m)   Wt 87.1 kg   SpO2 91%   BMI 29.19 kg/m  Gen:   Awake, no distress   Resp:  Normal effort  MSK:   Moves extremities without difficulty  Other:  Bowel sounds present.  Tender diffusely across the lower abdomen  Medical Decision Making  Medically screening exam initiated at 9:13 PM.  Appropriate orders placed.  Confer was informed that the remainder of the evaluation will be completed by another provider, this initial triage assessment does not replace that evaluation, and the importance of remaining in the ED until their evaluation is complete.  Patient will have labs, urinalysis at this time.   Shelby Dubin, PA-C 07/18/21 2114

## 2021-07-18 NOTE — ED Triage Notes (Signed)
Pt arrives with c/o lower ABD/pelvis pain that started 2 days ago. Pt endorses n/v. Pt denies fevers. Pt has hx of hernia.

## 2021-07-19 ENCOUNTER — Emergency Department: Payer: Medicaid Other

## 2021-07-19 ENCOUNTER — Emergency Department
Admission: EM | Admit: 2021-07-19 | Discharge: 2021-07-19 | Disposition: A | Discharge status: Home / Self Care | Payer: Medicaid Other | Attending: Emergency Medicine | Admitting: Emergency Medicine

## 2021-07-19 DIAGNOSIS — K529 Noninfective gastroenteritis and colitis, unspecified: Secondary | ICD-10-CM

## 2021-07-19 DIAGNOSIS — R1031 Right lower quadrant pain: Secondary | ICD-10-CM

## 2021-07-19 MED ORDER — FENTANYL CITRATE PF 50 MCG/ML IJ SOSY
50.0000 ug | PREFILLED_SYRINGE | Freq: Once | INTRAMUSCULAR | Status: AC
  Administered 2021-07-19: 50 ug via INTRAVENOUS
  Filled 2021-07-19: qty 1

## 2021-07-19 MED ORDER — PREDNISONE 10 MG (21) PO TBPK: ORAL_TABLET | ORAL | 0 refills | Status: AC

## 2021-07-19 MED ORDER — LACTATED RINGERS IV BOLUS
1000.0000 mL | Freq: Once | INTRAVENOUS | Status: AC
  Administered 2021-07-19: 1000 mL via INTRAVENOUS

## 2021-07-19 MED ORDER — ACETAMINOPHEN 500 MG PO TABS
1000.0000 mg | ORAL_TABLET | Freq: Once | ORAL | Status: AC
  Administered 2021-07-19: 1000 mg via ORAL
  Filled 2021-07-19: qty 2

## 2021-07-19 MED ORDER — IOHEXOL 300 MG/ML  SOLN
100.0000 mL | Freq: Once | INTRAMUSCULAR | Status: AC | PRN
Start: 2021-07-19 — End: 2021-07-19
  Administered 2021-07-19: 100 mL via INTRAVENOUS

## 2021-07-19 MED ORDER — METHYLPREDNISOLONE SODIUM SUCC 125 MG IJ SOLR: 125.0000 mg | Freq: Once | INTRAMUSCULAR | Status: DC

## 2021-07-19 MED ORDER — KETOROLAC TROMETHAMINE 30 MG/ML IJ SOLN
15.0000 mg | Freq: Once | INTRAMUSCULAR | Status: AC
  Administered 2021-07-19: 15 mg via INTRAVENOUS
  Filled 2021-07-19: qty 1

## 2021-07-19 MED ORDER — METHYLPREDNISOLONE SODIUM SUCC 125 MG IJ SOLR
125.0000 mg | Freq: Once | INTRAMUSCULAR | Status: AC
  Administered 2021-07-19: 125 mg via INTRAVENOUS
  Filled 2021-07-19: qty 2

## 2021-07-19 NOTE — Progress Notes (Signed)
Responded to consult for IV. On arrival, IV obtained by MD.

## 2021-07-19 NOTE — ED Provider Notes (Signed)
[facility] Provider Note    Event Date/Time   First MD Initiated Contact with Patient 07/19/21 0230     (approximate)   History   Abdominal Pain   HPI  Clifford Burgess is a 51 y.o. adult who presents to the ED for evaluation of Abdominal Pain   I reviewed pulmonary visit from 6/15.  History of COPD, GERD  Patient presents to the ED for evaluation of progressive RLQ abdominal pain for the past 2 days.  Reports lower pain to the suprapubic and RLQ abdomen.  Reports nausea and vomiting alongside it without diarrhea or stool changes.  No dysuria.  Noted to have a fever on reassessment after being roomed, no fevers at home.   Physical Exam   Triage Vital Signs: ED Triage Vitals  Enc Vitals Group     BP 07/18/21 2031 109/67     Pulse Rate 07/18/21 2031 90     Resp 07/18/21 2031 18     Temp 07/18/21 2031 98.1 F (36.7 C)     Temp Source 07/18/21 2031 Oral     SpO2 07/18/21 2031 91 %     Weight 07/18/21 2111 192 lb (87.1 kg)     Height 07/18/21 2111 5\' 8"  (1.727 m)     Head Circumference --      Peak Flow --      Pain Score 07/18/21 2111 8     Pain Loc --      Pain Edu? --      Excl. in GC? --     Most recent vital signs: Vitals:   07/18/21 2259 07/19/21 0455  BP: 107/66 109/62  Pulse: 86 72  Resp: 18 18  Temp: (!) 101 F (38.3 C)   SpO2: 96% 93%    General: Awake, no distress.  CV:  Good peripheral perfusion.  Resp:  Normal effort.  Abd:  No distention.  RLQ tenderness with guarding.  Otherwise benign abdomen.  No grossly peritoneal features. MSK:  No deformity noted.  Neuro:  No focal deficits appreciated. Other:     ED Results / Procedures / Treatments   Labs (all labs ordered are listed, but only abnormal results are displayed) Labs Reviewed  COMPREHENSIVE METABOLIC PANEL - Abnormal; Notable for the following components:      Result Value   Sodium 132 (*)    Glucose, Bld 122 (*)    Total Bilirubin 0.2 (*)    All  other components within normal limits  URINALYSIS, ROUTINE W REFLEX MICROSCOPIC - Abnormal; Notable for the following components:   Color, Urine YELLOW (*)    APPearance TURBID (*)    Specific Gravity, Urine 1.032 (*)    Bilirubin Urine SMALL (*)    Ketones, ur 5 (*)    Protein, ur 100 (*)    All other components within normal limits  LIPASE, BLOOD  CBC    EKG   RADIOLOGY CT abdomen/pelvis interpreted by me without evidence of acute appendicitis.  Official radiology report(s): CT ABDOMEN PELVIS W CONTRAST  Result Date: 07/19/2021 CLINICAL DATA:  51 year old male with right lower quadrant pain, abdominal pain. EXAM: CT ABDOMEN AND PELVIS WITH CONTRAST TECHNIQUE: Multidetector CT imaging of the abdomen and pelvis was performed using the standard protocol following bolus administration of intravenous contrast. RADIATION DOSE REDUCTION: This exam was performed according to the departmental dose-optimization program which includes automated exposure control, adjustment of the mA and/or kV according to patient size and/or use of iterative  reconstruction technique. CONTRAST:  OMNIPAQUE IOHEXOL 300 MG/ML  SOLN COMPARISON:  None Available. FINDINGS: Lower chest: Partially visible left gynecomastia. Mild left lower lobe scarring. Hepatobiliary: Liver and gallbladder appear negative. Pancreas: Negative. Spleen: Negative. Adrenals/Urinary Tract: Negative. Stomach/Bowel: Appendix remains normal in the right lower quadrant (coronal image 42), but the large bowel appears somewhat thickened and indistinct throughout the abdomen and pelvis and the large bowel contains fluid to the level of the sigmoid colon. Mild retained stool distally. Suspicion of widespread large bowel mucosal hyperenhancement. See coronal image 47. Additionally, the terminal ileum appears circumferentially thickened and inflamed (series 2, image 64 and coronal image 38) with little to no active mesenteric inflammation but appearance  of mesenteric creeping fat in the right lower quadrant on coronal image 37. Difficult to exclude an associated distal small bowel stricture on series 2, image 73, but no dilated upstream small bowel loops. Stomach and duodenum are within normal limits. No free air. No abdominal free fluid identified. Vascular/Lymphatic: Mild Aortoiliac calcified atherosclerosis. Major arterial structures remain patent. Portal venous system is patent. No lymphadenopathy identified. Reproductive: Negative. Other: Small volume of free fluid layering in the pelvis. Simple fluid density. Musculoskeletal: No acute osseous abnormality identified. IMPRESSION: Abnormal appearance of the large bowel and terminal ileum is most suspicious for Inflammatory Bowel Disease (Crohn's disease). Infectious Enterocolitis is felt less likely. Appendix remains normal. Small volume of associated free fluid in the pelvis. But no associated bowel obstruction or other complicating features. Electronically Signed   By: Odessa Fleming M.D.   On: 07/19/2021 06:14    PROCEDURES and INTERVENTIONS:  .1-3 Lead EKG Interpretation  Performed by: Delton Prairie, MD Authorized by: Delton Prairie, MD     Interpretation: normal     ECG rate:  88   ECG rate assessment: normal     Rhythm: sinus rhythm     Ectopy: none     Conduction: normal   Ultrasound ED Peripheral IV (Provider)  Date/Time: 07/19/2021 5:57 AM  Performed by: Delton Prairie, MD Authorized by: Delton Prairie, MD   Procedure details:    Indications: multiple failed IV attempts and poor IV access     Skin Prep: povidone-iodine     Location: left cephalic v.   Angiocath:  20 G   Bedside Ultrasound Guided: Yes     Images: not archived     Patient tolerated procedure without complications: Yes     Dressing applied: Yes     Medications  lactated ringers bolus 1,000 mL (1,000 mLs Intravenous New Bag/Given 07/19/21 0651)  fentaNYL (SUBLIMAZE) injection 50 mcg (50 mcg Intravenous Given 07/19/21 0517)   acetaminophen (TYLENOL) tablet 1,000 mg (1,000 mg Oral Given 07/19/21 0432)  iohexol (OMNIPAQUE) 300 MG/ML solution 100 mL (100 mLs Intravenous Contrast Given 07/19/21 0524)  methylPREDNISolone sodium succinate (SOLU-MEDROL) 125 mg/2 mL injection 125 mg (125 mg Intravenous Given 07/19/21 0652)  ketorolac (TORADOL) 30 MG/ML injection 15 mg (15 mg Intravenous Given 07/19/21 8841)     IMPRESSION / MDM / ASSESSMENT AND PLAN / ED COURSE  I reviewed the triage vital signs and the nursing notes.  Differential diagnosis includes, but is not limited to, appendicitis, diverticulitis, acute cystitis, pyelonephritis  {Patient presents with symptoms of an acute illness or injury that is potentially life-threatening.  51 year old presents to the ED with fever and RLQ abdominal pain and evidence of inflammatory enteritis, possibly Crohn's disease, suitable for outpatient management GI follow-up.  Noted be febrile prior to antipyretics but remains hemodynamically stable  and not tachycardic.  Doubt sepsis as he has no other SIRS criteria.  Normal CBC, lipase.  Metabolic panel with marginal hyponatremia.  Urine with ketones suggestive of dehydration, but no infectious features.  Due to tenderness and fever, concerns for appendicitis or other intra-abdominal infectious pathology, so CT obtained and demonstrates inflammatory changes concerning for Crohn's disease.  We will add anti-inflammatories, steroids and anticipate outpatient management with GI follow-up for an outpatient colonoscopy.  We discussed close return precautions.  Clinical Course as of 07/19/21 0704  Tue Jul 19, 2021  0518 USIV placed by me. [DS]  [id number] Reassessed and  [DS]    Clinical Course User Index [DS] Delton Prairie, MD     FINAL CLINICAL IMPRESSION(S) / ED DIAGNOSES   Final diagnoses:  Right lower quadrant abdominal pain  Inflammatory bowel disease     Rx / DC Orders   ED Discharge Orders          Ordered    predniSONE  (STERAPRED UNI-PAK 21 TAB) 10 MG (21) TBPK tablet  Daily        07/19/21 7035             Note:  This document was prepared using Dragon voice recognition software and may include unintentional dictation errors.   Delton Prairie, MD 07/19/21 [phone]

## 2021-07-19 NOTE — Discharge Instructions (Signed)
Please take Tylenol and ibuprofen/Advil for your pain.  It is safe to take them together, or to alternate them every few hours.  Take up to 1000mg  of Tylenol at a time, up to 4 times per day.  Do not take more than 4000 mg of Tylenol in 24 hours.  For ibuprofen, take 400-600 mg, 3 - 4 times per day.  Finish the steroids as prescribed and please follow-up with a GI doctor in the clinic to discuss having a colonoscopy performed for more definitive diagnosis  The CT imaging looked a lot like inflammatory bowel disease (Crohn's disease).

## 2021-08-17 ENCOUNTER — Telehealth: Payer: Self-pay

## 2021-08-17 NOTE — Telephone Encounter (Signed)
I need to see her

## 2021-08-17 NOTE — Telephone Encounter (Signed)
Lvm to schedule f/u w/ [id number]

## 2021-08-22 ENCOUNTER — Ambulatory Visit: Payer: Medicaid Other | Admitting: Internal Medicine

## 2021-08-22 ENCOUNTER — Encounter: Payer: Self-pay | Admitting: Internal Medicine

## 2021-08-22 VITALS — BP 108/70 | HR 71 | Temp 98.5°F | Resp 16 | Ht 68.0 in | Wt 190.2 lb

## 2021-08-22 DIAGNOSIS — Z79899 Other long term (current) drug therapy: Secondary | ICD-10-CM

## 2021-08-22 DIAGNOSIS — K529 Noninfective gastroenteritis and colitis, unspecified: Secondary | ICD-10-CM | POA: Diagnosis not present

## 2021-08-22 DIAGNOSIS — R232 Flushing: Secondary | ICD-10-CM | POA: Diagnosis not present

## 2021-08-22 DIAGNOSIS — F640 Transsexualism: Secondary | ICD-10-CM

## 2021-08-22 DIAGNOSIS — R011 Cardiac murmur, unspecified: Secondary | ICD-10-CM | POA: Diagnosis not present

## 2021-08-22 DIAGNOSIS — J449 Chronic obstructive pulmonary disease, unspecified: Secondary | ICD-10-CM

## 2021-08-22 DIAGNOSIS — R0602 Shortness of breath: Secondary | ICD-10-CM | POA: Diagnosis not present

## 2021-08-22 DIAGNOSIS — Z79890 Hormone replacement therapy (postmenopausal): Secondary | ICD-10-CM

## 2021-08-22 NOTE — Progress Notes (Signed)
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 Pt has been on high dose estrogen along with progesterone. She is also on Spironolactone. Continues to be a smoker, does  consume Alcohol and other recreational drugs . She is also on home O2 both with ambulation and at rest. PFT show only mild copd. No h/o COVID or any other lung disease. No echo on record no h/o PAH   No CT chest is on record  Multiple pulmonary stress test ( 6 min walk) show significant desaturations)   No ABG on file  Recent Labs are not available   Recent visit to ED with abdominal pain had the following finds. IMPRESSION: Abnormal appearance of the large bowel and terminal ileum is most suspicious for Inflammatory Bowel Disease (Crohn's disease). Infectious Enterocolitis is felt less likely. Appendix remains normal. Small volume of associated free fluid in the pelvis. But no associated bowel obstruction or other complicating features Changes in Lung bases and aortic atherosclerosis  Current Medication: Outpatient Encounter Medications as of 08/22/2021  Medication Sig   albuterol (PROVENTIL) (2.5 MG/3ML) 0.083% nebulizer solution Take 3 mLs (2.5 mg total) by nebulization every 6 (six) hours as needed for wheezing or shortness of breath.   albuterol (VENTOLIN HFA) 108 (90 Base) MCG/ACT inhaler Inhale 2 puffs into the lungs every 6 (six) hours as needed for wheezing or shortness of breath.   aspirin EC 81 MG tablet Take 81 mg by mouth daily.   estradiol (ESTRACE) 2 MG tablet Take 2 mg by mouth 3 (three) times daily.   ipratropium-albuterol  (DUONEB) 0.5-2.5 (3) MG/3ML SOLN TAKE 3 MLS BY NEBULIZATION EVERY 6 (SIX) HOURS AS NEEDED.   omeprazole (PRILOSEC) 20 MG capsule TAKE 1 CAPSULE BY MOUTH EVERY DAY   OXYGEN Inhale 2 L into the lungs. At night pt uses 24 hrs from [facility] Pt   progesterone (PROMETRIUM) 100 MG capsule SMARTSIG:1 Capsule(s) By Mouth Every Evening   spironolactone (ALDACTONE) 50 MG tablet Take 50 mg by mouth 2 (two) times daily.   [DISCONTINUED] umeclidinium-vilanterol (ANORO ELLIPTA) 62.5-25 MCG/ACT AEPB Inhale 1 puff into the lungs daily. (Patient not taking: Reported on 08/22/2021)   No facility-administered encounter medications on file as of 08/22/2021.    Surgical History: Past Surgical History:  Procedure Laterality Date   HERNIA REPAIR      Medical History: Past Medical History:  Diagnosis Date   Asthma    COPD (chronic obstructive pulmonary disease) (HCC)    GERD (gastroesophageal reflux disease)     Family History: Family History  Problem Relation Age of Onset   Hypertension Father     Social History   Socioeconomic History   Marital status: Divorced    Spouse name: Not on file   Number of children: 2   Years of education: Not on file   Highest education level: 8th grade  Occupational History   Not on file  Tobacco Use   Smoking status: Every Day    Packs/day: 1.00    Years: 36.00    Total pack years: 36.00    Types:  Cigarettes   Smokeless tobacco: Former    Types: Chew   Tobacco comments:    1 pack a day  Vaping Use   Vaping Use: Never used  Substance and Sexual Activity   Alcohol use: Yes    Alcohol/week: 2.0 standard drinks of alcohol    Types: 2 Cans of beer per week    Comment: occational   Drug use: Not Currently    Types: Cocaine, Marijuana, Heroin, LSD, "Crack" cocaine   Sexual activity: Not Currently  Other Topics Concern   Not on file  Social History Narrative   Not on file   Social Determinants of Health   Financial Resource Strain: High Risk  (11/27/2017)   Overall Financial Resource Strain (CARDIA)    Difficulty of Paying Living Expenses: Hard  Food Insecurity: Food Insecurity Present (11/27/2017)   Hunger Vital Sign    Worried About Running Out of Food in the Last Year: Often true    Ran Out of Food in the Last Year: Often true  Transportation Needs: No Transportation Needs (11/27/2017)   PRAPARE - Administrator, Civil Service (Medical): No    Lack of Transportation (Non-Medical): No  Physical Activity: Inactive (11/27/2017)   Exercise Vital Sign    Days of Exercise per Week: 0 days    Minutes of Exercise per Session: 0 min  Stress: No Stress Concern Present (11/27/2017)   [employer] of Occupational Health - Occupational Stress Questionnaire    Feeling of Stress : Not at all  Social Connections: Unknown (11/27/2017)   Social Connection and Isolation Panel [NHANES]    Frequency of Communication with Friends and Family: Not on file    Frequency of Social Gatherings with Friends and Family: Not on file    Attends Religious Services: Never    Active Member of Clubs or Organizations: No    Attends Banker Meetings: Never    Marital Status: Divorced  Catering manager Violence: Unknown (11/27/2017)   Humiliation, Afraid, Rape, and Kick questionnaire    Fear of Current or Ex-Partner: Not on file    Emotionally Abused: No    Physically Abused: No    Sexually Abused: No      Review of Systems  Constitutional:  Negative for fatigue and fever.       [place]   HENT:  Negative for congestion, mouth sores and postnasal drip.   Eyes:  Positive for pain.  Respiratory:  Positive for shortness of breath. Negative for cough.   Cardiovascular:  Negative for chest pain.  Gastrointestinal: Negative.   Genitourinary:  Negative for flank pain.  Allergic/Immunologic: Negative.   Neurological: Negative.   Psychiatric/Behavioral:  Positive for dysphoric mood.     Vital Signs: BP 108/70   Pulse 71    Temp 98.5 F (36.9 C)   Resp 16   Ht 5\' 8"  (1.727 m)   Wt 190 lb 3.2 oz (86.3 kg)   SpO2 100% Comment: 3 liters oxygen  BMI 28.92 kg/m    Physical Exam Constitutional:      Appearance: Normal appearance.  HENT:     Head: Normocephalic and atraumatic.     Nose: Nose normal.     Mouth/Throat:     Mouth: Mucous membranes are moist.     Pharynx: No posterior oropharyngeal erythema.  Eyes:     Extraocular Movements: Extraocular movements intact.     Pupils: Pupils are equal, round, and reactive to light.  Cardiovascular:  Pulses: Normal pulses.     Heart sounds: Murmur heard.  Pulmonary:     Effort: Pulmonary effort is normal.     Breath sounds: Normal breath sounds.  Neurological:     General: No focal deficit present.     Mental Status: He is alert.  Psychiatric:        Mood and Affect: Mood normal.        Behavior: Behavior normal.     Assessment/Plan: 1. Shortness of breath Continue home O2 for now until further diagnstics are done and  a definite dx is made  - ECHOCARDIOGRAM COMPLETE; Future - EKG 12-Lead - 6 minute walk; Future - Fe+TIBC+Fer - B12 and Folate Panel  2. Hormone replacement therapy Will avoid therapy due to side effects  - Lipid Panel With LDL/HDL Ratio - Testosterone,Free and Total - FSH/LH - Estrogens, total - 17-Hydroxyprogesterone - Fe+TIBC+Fer - B12 and Folate Panel  3. [place]thora Face has erthema, will check ferritin and iron  - Fe+TIBC+Fer - B12 and Folate Panel  4. IBD (inflammatory bowel disease) Abnormal CT abdomen, will need to see GI   5. 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 Murmur, cardiac Await results  - ECHOCARDIOGRAM COMPLETE; Future  7. Chronic obstructive pulmonary disease, unspecified COPD type (HCC) Will continue on MDI. Will need CT chest    General Counseling: kylon philbrook understanding of the findings of todays visit and agrees with plan of treatment. I have discussed any  further diagnostic evaluation that may be needed or ordered today. We also reviewed his medications today. he has been encouraged to call the office with any questions or concerns that should arise related to todays visit.    Orders Placed This Encounter  Procedures   Lipid Panel With LDL/HDL Ratio   Testosterone,Free and Total   FSH/LH   Estrogens, total   17-Hydroxyprogesterone   Fe+TIBC+Fer   B12 and Folate Panel   EKG 12-Lead   ECHOCARDIOGRAM COMPLETE   6 minute walk    No orders of the defined types were placed in this encounter.   Total time spent:35 Minutes Time spent includes review of chart, medications, test results, and follow up plan with the patient.   [place] Controlled Substance Database was reviewed by me.   Dr Lyndon Code Internal medicine

## 2021-08-30 ENCOUNTER — Other Ambulatory Visit: Payer: Self-pay | Admitting: Physician Assistant

## 2021-08-30 DIAGNOSIS — K219 Gastro-esophageal reflux disease without esophagitis: Secondary | ICD-10-CM

## 2021-09-05 ENCOUNTER — Other Ambulatory Visit
Admission: RE | Admit: 2021-09-05 | Discharge: 2021-09-05 | Disposition: A | Discharge status: Home / Self Care | Payer: Medicaid Other | Source: Ambulatory Visit | Attending: Physician Assistant | Admitting: Physician Assistant

## 2021-09-05 ENCOUNTER — Other Ambulatory Visit
Admission: RE | Admit: 2021-09-05 | Discharge: 2021-09-05 | Disposition: A | Discharge status: Home / Self Care | Payer: Medicaid Other | Attending: Internal Medicine | Admitting: Internal Medicine

## 2021-09-05 DIAGNOSIS — Z79890 Hormone replacement therapy (postmenopausal): Secondary | ICD-10-CM | POA: Diagnosis not present

## 2021-09-05 DIAGNOSIS — R5383 Other fatigue: Secondary | ICD-10-CM | POA: Insufficient documentation

## 2021-09-05 LAB — TSH: TSH: 1.542 u[IU]/mL (ref 0.350–4.500)

## 2021-09-05 LAB — LIPID PANEL
Cholesterol: 215 mg/dL — ABNORMAL HIGH (ref 0–200)
HDL: 50 mg/dL (ref >40)
LDL Cholesterol: 143 mg/dL — ABNORMAL HIGH (ref 0–99)
Total CHOL/HDL Ratio: 4.3 RATIO
Triglycerides: 108 mg/dL (ref <150)
VLDL: 22 mg/dL (ref 0–40)

## 2021-09-05 LAB — T4, FREE: Free T4: 0.69 ng/dL (ref 0.61–1.12)

## 2021-09-05 LAB — PSA: Prostatic Specific Antigen: 0.27 ng/mL (ref 0.00–4.00)

## 2021-09-06 LAB — FSH/LH
FSH: 1.6 m[IU]/mL (ref 1.5–12.4)
LH: 4.1 m[IU]/mL (ref 1.7–8.6)

## 2021-09-07 LAB — MISC LABCORP TEST (SEND OUT): Labcorp test code: 70085

## 2021-09-09 LAB — ESTROGENS, TOTAL: Estrogen: 668 pg/mL — ABNORMAL HIGH (ref 56–213)

## 2021-09-10 NOTE — Progress Notes (Signed)
Labs reviewed.

## 2021-09-12 LAB — TESTOSTERONE,FREE AND TOTAL
Testosterone, Free: 3.2 pg/mL — ABNORMAL LOW (ref 7.2–24.0)
Testosterone: 108 ng/dL — ABNORMAL LOW (ref 264–916)

## 2021-09-13 ENCOUNTER — Telehealth: Payer: Self-pay | Admitting: Internal Medicine

## 2021-09-13 NOTE — Telephone Encounter (Signed)
S/w patient. She stated she will cancel new appointment with Dr. Yetta Barre and stay with our provider-Toni

## 2021-09-14 ENCOUNTER — Telehealth: Payer: Self-pay | Admitting: Physician Assistant

## 2021-09-14 NOTE — Telephone Encounter (Signed)
Left vm and sent mychart message to confirm 09/19/21 appointment-Toni 

## 2021-09-19 ENCOUNTER — Ambulatory Visit: Payer: Medicaid Other

## 2021-09-19 DIAGNOSIS — R011 Cardiac murmur, unspecified: Secondary | ICD-10-CM

## 2021-09-19 DIAGNOSIS — R0602 Shortness of breath: Secondary | ICD-10-CM

## 2021-10-03 ENCOUNTER — Ambulatory Visit (INDEPENDENT_AMBULATORY_CARE_PROVIDER_SITE_OTHER): Payer: Medicaid Other | Admitting: Nurse Practitioner

## 2021-10-03 ENCOUNTER — Encounter: Payer: Self-pay | Admitting: Nurse Practitioner

## 2021-10-03 VITALS — BP 144/77 | HR 64 | Temp 98.2°F | Resp 16 | Ht 68.0 in | Wt 187.2 lb

## 2021-10-03 DIAGNOSIS — Z9981 Dependence on supplemental oxygen: Secondary | ICD-10-CM

## 2021-10-03 DIAGNOSIS — Z23 Encounter for immunization: Secondary | ICD-10-CM | POA: Diagnosis not present

## 2021-10-03 DIAGNOSIS — J449 Chronic obstructive pulmonary disease, unspecified: Secondary | ICD-10-CM | POA: Diagnosis not present

## 2021-10-03 MED ORDER — ALBUTEROL SULFATE HFA 108 (90 BASE) MCG/ACT IN AERS
2.0000 | INHALATION_SPRAY | Freq: Four times a day (QID) | RESPIRATORY_TRACT | 3 refills | Status: DC | PRN
Start: 2021-10-03 — End: 2021-10-03

## 2021-10-03 MED ORDER — ALBUTEROL SULFATE HFA 108 (90 BASE) MCG/ACT IN AERS: 2.0000 | INHALATION_SPRAY | Freq: Four times a day (QID) | RESPIRATORY_TRACT | 3 refills | Status: DC | PRN

## 2021-10-03 NOTE — Progress Notes (Unsigned)
[facility] [place], [place] [address]  Internal MEDICINE  Office Visit Note  Patient Name: Clifford Burgess  [id number]  [id number]  Date of Service: 10/04/2021  Chief Complaint  Patient presents with   Follow-up    Pulmonary visit   Gastroesophageal Reflux    HPI Clifford Burgess presents for a follow up visit for COPD, continuous O2 use and shortness of breath.  --declined spirometry in office today, stated he did not have enough time because his ride is waiting on him. His last PFT was done in 2021.  --stopped using anoro ellipta, reports that it was causing thrush even if he rinsed his mouth and he also does not like the powder inhalers. --is willing to try something else. --currently needing 2 LPM via [place] supplemental O2 --no significant change in breathing-- at baseline with intermittent SOB, cough, chest tightness and slight wheezing. Has not had any recent hospitalizations related to COPD or any acute COPD exacerbations treated outpatient, but reports that she is using her albuterol rescue inhaler "like candy" since stopped the anoro inhaler.    Current Medication: Outpatient Encounter Medications as of 10/03/2021  Medication Sig   albuterol (PROVENTIL) (2.5 MG/3ML) 0.083% nebulizer solution Take 3 mLs (2.5 mg total) by nebulization every 6 (six) hours as needed for wheezing or shortness of breath.   aspirin EC 81 MG tablet Take 81 mg by mouth daily.   estradiol (ESTRACE) 2 MG tablet Take 2 mg by mouth 3 (three) times daily.   ipratropium-albuterol (DUONEB) 0.5-2.5 (3) MG/3ML SOLN TAKE 3 MLS BY NEBULIZATION EVERY 6 (SIX) HOURS AS NEEDED.   omeprazole (PRILOSEC) 20 MG capsule TAKE 1 CAPSULE BY MOUTH EVERY DAY   OXYGEN Inhale 2 L into the lungs. At night pt uses 24 hrs from [facility] Pt   progesterone (PROMETRIUM) 100 MG capsule SMARTSIG:1 Capsule(s) By Mouth Every Evening   spironolactone (ALDACTONE) 50 MG tablet Take 50 mg by mouth 2 (two) times daily.    [DISCONTINUED] albuterol (VENTOLIN HFA) 108 (90 Base) MCG/ACT inhaler Inhale 2 puffs into the lungs every 6 (six) hours as needed for wheezing or shortness of breath.   albuterol (VENTOLIN HFA) 108 (90 Base) MCG/ACT inhaler Inhale 2 puffs into the lungs every 6 (six) hours as needed for wheezing or shortness of breath.   [DISCONTINUED] albuterol (VENTOLIN HFA) 108 (90 Base) MCG/ACT inhaler Inhale 2 puffs into the lungs every 6 (six) hours as needed for wheezing or shortness of breath.   [DISCONTINUED] albuterol (VENTOLIN HFA) 108 (90 Base) MCG/ACT inhaler Inhale 2 puffs into the lungs every 6 (six) hours as needed for wheezing or shortness of breath.   No facility-administered encounter medications on file as of 10/03/2021.    Surgical History: Past Surgical History:  Procedure Laterality Date   HERNIA REPAIR      Medical History: Past Medical History:  Diagnosis Date   Asthma    COPD (chronic obstructive pulmonary disease) (HCC)    GERD (gastroesophageal reflux disease)     Family History: Family History  Problem Relation Age of Onset   Hypertension Father     Social History   Socioeconomic History   Marital status: Divorced    Spouse name: Not on file   Number of children: 2   Years of education: Not on file   Highest education level: 8th grade  Occupational History   Not on file  Tobacco Use   Smoking status: Every Day    Packs/day: 1.00  Years: 36.00    Total pack years: 36.00    Types: Cigarettes   Smokeless tobacco: Former    Types: Chew   Tobacco comments:    1 pack a day  Vaping Use   Vaping Use: Never used  Substance and Sexual Activity   Alcohol use: Yes    Alcohol/week: 2.0 standard drinks of alcohol    Types: 2 Cans of beer per week    Comment: occational   Drug use: Not Currently    Types: Cocaine, Marijuana, Heroin, LSD, "Crack" cocaine   Sexual activity: Not Currently  Other Topics Concern   Not on file  Social History Narrative   Not on  file   Social Determinants of Health   Financial Resource Strain: High Risk (11/27/2017)   Overall Financial Resource Strain (CARDIA)    Difficulty of Paying Living Expenses: Hard  Food Insecurity: Food Insecurity Present (11/27/2017)   Hunger Vital Sign    Worried About Running Out of Food in the Last Year: Often true    Ran Out of Food in the Last Year: Often true  Transportation Needs: No Transportation Needs (11/27/2017)   PRAPARE - Hydrologist (Medical): No    Lack of Transportation (Non-Medical): No  Physical Activity: Inactive (11/27/2017)   Exercise Vital Sign    Days of Exercise per Week: 0 days    Minutes of Exercise per Session: 0 min  Stress: No Stress Concern Present (11/27/2017)   [place]    Feeling of Stress : Not at all  Social Connections: Unknown (11/27/2017)   Social Connection and Isolation Panel [NHANES]    Frequency of Communication with Friends and Family: Not on file    Frequency of Social Gatherings with Friends and Family: Not on file    Attends Religious Services: Never    Marine scientist or Organizations: No    Attends Archivist Meetings: Never    Marital Status: Divorced  Human resources officer Violence: Unknown (11/27/2017)   Humiliation, Afraid, Rape, and Kick questionnaire    Fear of Current or Ex-Partner: Not on file    Emotionally Abused: No    Physically Abused: No    Sexually Abused: No      Review of Systems  Constitutional:  Negative for appetite change, chills, fatigue and unexpected weight change.  HENT:  Negative for congestion, rhinorrhea, sneezing and sore throat.   Respiratory:  Positive for cough (smoker's cough), chest tightness (intermittent), shortness of breath (using continuous oxygen. 2 LPM) and wheezing (intermittent).   Cardiovascular: Negative.  Negative for chest pain and palpitations.  Gastrointestinal:   Negative for abdominal pain, constipation, diarrhea, nausea and vomiting.  Neurological: Negative.   Psychiatric/Behavioral:  Negative for behavioral problems (Depression), sleep disturbance and suicidal ideas. The patient is not nervous/anxious.     Vital Signs: BP (!) 144/77   Pulse 64   Temp 98.2 F (36.8 C)   Resp 16   Ht 5\' 8"  (1.727 m)   Wt 187 lb 3.2 oz (84.9 kg)   SpO2 99% Comment: 2 Liters  BMI 28.46 kg/m    Physical Exam Vitals reviewed.  Constitutional:      General: He is awake. He is not in acute distress.    Appearance: Normal appearance. He is well-developed, well-groomed and overweight. He is not ill-appearing.     Interventions: Nasal cannula in place.  HENT:     Head: Normocephalic and  atraumatic.  Eyes:     Pupils: Pupils are equal, round, and reactive to light.  Cardiovascular:     Rate and Rhythm: Normal rate and regular rhythm.     Heart sounds: Normal heart sounds. No murmur heard. Pulmonary:     Effort: Pulmonary effort is normal. No accessory muscle usage or respiratory distress.     Breath sounds: Normal air entry. Examination of the right-upper field reveals wheezing. Examination of the left-upper field reveals wheezing. Examination of the right-middle field reveals wheezing. Examination of the left-middle field reveals wheezing. Examination of the right-lower field reveals decreased breath sounds. Examination of the left-lower field reveals decreased breath sounds. Decreased breath sounds and wheezing present. No rales.  Neurological:     Mental Status: He is alert and oriented to person, place, and time.  Psychiatric:        Mood and Affect: Mood normal.        Behavior: Behavior normal. Behavior is cooperative.        Assessment/Plan: 1. Obstructive chronic bronchitis without exacerbation ([place]) Need updated PFT, will plan this priop to 6 month pulm follow up in march, new O2 DME order for [facility], see problem #2, refills for albuterol inhaler  sent. 1 breztri sample given to patient to try, instructed patient to try for 1 week and if it helps, I can send a prescription to the pharmacy.  - Pulmonary function test; Future - For home use only DME oxygen - albuterol (VENTOLIN HFA) 108 (90 Base) MCG/ACT inhaler; Inhale 2 puffs into the lungs every 6 (six) hours as needed for wheezing or shortness of breath.  Dispense: 18 g; Refill: 3  2. Oxygen dependent Has been on continuous home O2 for year per patient and has been with [facility] for >1 year. Is still using portable oxygen tanks, wants to switch to portable rechargeable imogen oxygen concentrator instead of the small or large O2 tanks. New DME order sent.  - For home use only DME oxygen  3. Needs flu shot Flu shot administered in office today - Flu Vaccine MDCK QUAD PF   General Counseling: Clifford Burgess verbalizes understanding of the findings of todays visit and agrees with plan of treatment. I have discussed any further diagnostic evaluation that may be needed or ordered today. We also reviewed his medications today. he has been encouraged to call the office with any questions or concerns that should arise related to todays visit.    Orders Placed This Encounter  Procedures   For home use only DME oxygen   Flu Vaccine MDCK QUAD PF   Pulmonary function test    Meds ordered this encounter  Medications   DISCONTD: albuterol (VENTOLIN HFA) 108 (90 Base) MCG/ACT inhaler    Sig: Inhale 2 puffs into the lungs every 6 (six) hours as needed for wheezing or shortness of breath.    Dispense:  18 g    Refill:  3    Ok to fill proair   DISCONTD: albuterol (VENTOLIN HFA) 108 (90 Base) MCG/ACT inhaler    Sig: Inhale 2 puffs into the lungs every 6 (six) hours as needed for wheezing or shortness of breath.    Dispense:  18 g    Refill:  3    Ok to fill proair   albuterol (VENTOLIN HFA) 108 (90 Base) MCG/ACT inhaler    Sig: Inhale 2 puffs into the lungs every 6 (six) hours as needed for wheezing  or shortness of breath.    Dispense:  18 g    Refill:  3    Ok to fill proair    Return in about 6 months (around 04/03/2022) for F/U, pulmonary only, Ji Fairburn PCP, pls have PFT done early march 2024 before appt.   Total time spent:30 Minutes Time spent includes review of chart, medications, test results, and follow up plan with the patient.   [place] Controlled Substance Database was reviewed by me.  This patient was seen by Clifford Osgood, FNP-C in collaboration with Dr. Clayborn Bigness as a part of collaborative care agreement.   Rolen Conger R. Valetta Fuller, MSN, FNP-C Internal medicine

## 2021-10-04 ENCOUNTER — Encounter: Payer: Self-pay | Admitting: Internal Medicine

## 2021-10-04 ENCOUNTER — Ambulatory Visit: Payer: Medicaid Other | Admitting: Internal Medicine

## 2021-10-04 ENCOUNTER — Encounter: Payer: Self-pay | Admitting: Nurse Practitioner

## 2021-10-04 VITALS — BP 127/68 | HR 65 | Temp 98.2°F | Resp 16 | Ht 68.0 in | Wt 191.4 lb

## 2021-10-04 DIAGNOSIS — J9611 Chronic respiratory failure with hypoxia: Secondary | ICD-10-CM

## 2021-10-04 DIAGNOSIS — E782 Mixed hyperlipidemia: Secondary | ICD-10-CM | POA: Diagnosis not present

## 2021-10-04 DIAGNOSIS — F640 Transsexualism: Secondary | ICD-10-CM

## 2021-10-04 DIAGNOSIS — Z9981 Dependence on supplemental oxygen: Secondary | ICD-10-CM

## 2021-10-04 DIAGNOSIS — Z79890 Hormone replacement therapy (postmenopausal): Secondary | ICD-10-CM

## 2021-10-04 DIAGNOSIS — Z79899 Other long term (current) drug therapy: Secondary | ICD-10-CM

## 2021-10-04 DIAGNOSIS — R232 Flushing: Secondary | ICD-10-CM

## 2021-10-04 MED ORDER — SPIRONOLACTONE 50 MG PO TABS
50.0000 mg | ORAL_TABLET | Freq: Two times a day (BID) | ORAL | 1 refills | Status: DC
Start: 2021-10-04 — End: 2023-11-18

## 2021-10-04 MED ORDER — ESTRADIOL 1 MG PO TABS: 1.0000 mg | ORAL_TABLET | Freq: Every day | ORAL | 1 refills | Status: DC

## 2021-10-04 MED ORDER — ROSUVASTATIN CALCIUM 10 MG PO TABS: 10.0000 mg | ORAL_TABLET | Freq: Every day | ORAL | 3 refills | Status: DC

## 2021-10-04 MED ORDER — PROGESTERONE MICRONIZED 100 MG PO CAPS: ORAL_CAPSULE | ORAL | 1 refills | Status: DC

## 2021-10-04 NOTE — Progress Notes (Unsigned)
[facility] [place], [place] [address]  Internal MEDICINE  Office Visit Note  Patient Name: Clifford Burgess  [id number]  [id number]  Date of Service: 10/05/2021  Chief Complaint  Patient presents with   Follow-up    Review Echo    HPI Patient is here for routine follow-up and to discuss echo results which was done due to his/ her oxygen dependence, he is a t transgender male on estrogen/HRT Patient is on high-dose estrogen 2 mg 3 times a day, does have elevated hemoglobin also a smoker, has COPD and on oxygen Abnormal lipid profile has not been on any antilipid therapy in the past  Current Medication: Outpatient Encounter Medications as of 10/04/2021  Medication Sig   albuterol (PROVENTIL) (2.5 MG/3ML) 0.083% nebulizer solution Take 3 mLs (2.5 mg total) by nebulization every 6 (six) hours as needed for wheezing or shortness of breath.   albuterol (VENTOLIN HFA) 108 (90 Base) MCG/ACT inhaler Inhale 2 puffs into the lungs every 6 (six) hours as needed for wheezing or shortness of breath.   aspirin EC 81 MG tablet Take 81 mg by mouth daily.   estradiol (ESTRACE) 1 MG tablet Take 1 tablet (1 mg total) by mouth daily.   ipratropium-albuterol (DUONEB) 0.5-2.5 (3) MG/3ML SOLN TAKE 3 MLS BY NEBULIZATION EVERY 6 (SIX) HOURS AS NEEDED.   omeprazole (PRILOSEC) 20 MG capsule TAKE 1 CAPSULE BY MOUTH EVERY DAY   OXYGEN Inhale 2 L into the lungs. At night pt uses 24 hrs from [facility] Pt   rosuvastatin (CRESTOR) 10 MG tablet Take 1 tablet (10 mg total) by mouth daily.   [DISCONTINUED] estradiol (ESTRACE) 2 MG tablet Take 2 mg by mouth 3 (three) times daily.   [DISCONTINUED] progesterone (PROMETRIUM) 100 MG capsule SMARTSIG:1 Capsule(s) By Mouth Every Evening   [DISCONTINUED] spironolactone (ALDACTONE) 50 MG tablet Take 50 mg by mouth 2 (two) times daily.   progesterone (PROMETRIUM) 100 MG capsule SMARTSIG:1 Capsule(s) By Mouth Every Evening   spironolactone  (ALDACTONE) 50 MG tablet Take 1 tablet (50 mg total) by mouth 2 (two) times daily.   No facility-administered encounter medications on file as of 10/04/2021.    Surgical History: Past Surgical History:  Procedure Laterality Date   HERNIA REPAIR      Medical History: Past Medical History:  Diagnosis Date   Asthma    COPD (chronic obstructive pulmonary disease) (HCC)    GERD (gastroesophageal reflux disease)     Family History: Family History  Problem Relation Age of Onset   Hypertension Father     Social History   Socioeconomic History   Marital status: Divorced    Spouse name: Not on file   Number of children: 2   Years of education: Not on file   Highest education level: 8th grade  Occupational History   Not on file  Tobacco Use   Smoking status: Every Day    Packs/day: 1.00    Years: 36.00    Total pack years: 36.00    Types: Cigarettes   Smokeless tobacco: Former    Types: Chew   Tobacco comments:    1 pack a day  Vaping Use   Vaping Use: Never used  Substance and Sexual Activity   Alcohol use: Yes    Alcohol/week: 2.0 standard drinks of alcohol    Types: 2 Cans of beer per week    Comment: occational   Drug use: Not Currently    Types: Cocaine, Marijuana, Heroin, LSD, "  Crack" cocaine   Sexual activity: Not Currently  Other Topics Concern   Not on file  Social History Narrative   Not on file   Social Determinants of Health   Financial Resource Strain: High Risk (11/27/2017)   Overall Financial Resource Strain (CARDIA)    Difficulty of Paying Living Expenses: Hard  Food Insecurity: Food Insecurity Present (11/27/2017)   Hunger Vital Sign    Worried About Running Out of Food in the Last Year: Often true    Ran Out of Food in the Last Year: Often true  Transportation Needs: No Transportation Needs (11/27/2017)   PRAPARE - Administrator, Civil Service (Medical): No    Lack of Transportation (Non-Medical): No  Physical Activity:  Inactive (11/27/2017)   Exercise Vital Sign    Days of Exercise per Week: 0 days    Minutes of Exercise per Session: 0 min  Stress: No Stress Concern Present (11/27/2017)   [employer] of Occupational Health - Occupational Stress Questionnaire    Feeling of Stress : Not at all  Social Connections: Unknown (11/27/2017)   Social Connection and Isolation Panel [NHANES]    Frequency of Communication with Friends and Family: Not on file    Frequency of Social Gatherings with Friends and Family: Not on file    Attends Religious Services: Never    Active Member of Clubs or Organizations: No    Attends Banker Meetings: Never    Marital Status: Divorced  Catering manager Violence: Unknown (11/27/2017)   Humiliation, Afraid, Rape, and Kick questionnaire    Fear of Current or Ex-Partner: Not on file    Emotionally Abused: No    Physically Abused: No    Sexually Abused: No      Review of Systems  Constitutional:  Negative for chills, fatigue and unexpected weight change.  HENT:  Positive for postnasal drip. Negative for congestion, rhinorrhea, sneezing and sore throat.   Eyes:  Negative for redness.  Respiratory:  Positive for shortness of breath. Negative for cough and chest tightness.   Cardiovascular:  Negative for chest pain and palpitations.  Gastrointestinal:  Negative for abdominal pain, constipation, diarrhea, nausea and vomiting.  Genitourinary:  Negative for dysuria and frequency.  Musculoskeletal:  Negative for arthralgias, back pain, joint swelling and neck pain.  Skin:  Negative for rash.  Neurological: Negative.  Negative for tremors and numbness.  Hematological:  Negative for adenopathy. Does not bruise/bleed easily.  Psychiatric/Behavioral:  Negative for behavioral problems (Depression), sleep disturbance and suicidal ideas. The patient is not nervous/anxious.     Vital Signs: BP 127/68   Pulse 65   Temp 98.2 F (36.8 C)   Resp 16   Ht 5\' 8"   (1.727 m)   Wt 191 lb 6.4 oz (86.8 kg)   SpO2 100%   BMI 29.10 kg/m    Physical Exam Constitutional:      Appearance: Normal appearance.  HENT:     Head: Normocephalic and atraumatic.     Nose: Nose normal.     Mouth/Throat:     Mouth: Mucous membranes are moist.     Pharynx: No posterior oropharyngeal erythema.  Eyes:     Extraocular Movements: Extraocular movements intact.     Pupils: Pupils are equal, round, and reactive to light.  Cardiovascular:     Pulses: Normal pulses.     Heart sounds: Normal heart sounds.  Pulmonary:     Effort: Pulmonary effort is normal.     Breath  sounds: Normal breath sounds.  Neurological:     General: No focal deficit present.     Mental Status: He is alert.  Psychiatric:        Mood and Affect: Mood normal.        Behavior: Behavior normal.        Assessment/Plan: 1. 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 - progesterone (PROMETRIUM) 100 MG capsule; SMARTSIG:1 Capsule(s) By Mouth Every Evening  Dispense: 90 capsule; Refill: 1 - estradiol (ESTRACE) 1 MG tablet; Take 1 tablet (1 mg total) by mouth daily.  Dispense: 90 tablet; Refill: 1 - spironolactone (ALDACTONE) 50 MG tablet; Take 1 tablet (50 mg total) by mouth 2 (two) times daily.  Dispense: 180 tablet; Refill: 1  2. [facility] We will check his iron studies patient does have elevated hemoglobin - B12 and Folate Panel - Fe+TIBC+Fer  3. Chronic respiratory failure with hypoxia (HCC) Continue oxygen therapy  4. Mixed hyperlipidemia We will start Crestor 10 mg once a day  General Counseling: Lytle Michaels understanding of the findings of todays visit and agrees with plan of treatment. I have discussed any further diagnostic evaluation that may be  needed or ordered today. We also reviewed his medications today. he has been encouraged to call the office with any questions or concerns that should arise related to todays visit.    Orders Placed This Encounter  Procedures   B12 and Folate Panel   Fe+TIBC+Fer    Meds ordered this encounter  Medications   progesterone (PROMETRIUM) 100 MG capsule    Sig: SMARTSIG:1 Capsule(s) By Mouth Every Evening    Dispense:  90 capsule    Refill:  1   estradiol (ESTRACE) 1 MG tablet    Sig: Take 1 tablet (1 mg total) by mouth daily.    Dispense:  90 tablet    Refill:  1   spironolactone (ALDACTONE) 50 MG tablet    Sig: Take 1 tablet (50 mg total) by mouth 2 (two) times daily.    Dispense:  180 tablet    Refill:  1   rosuvastatin (CRESTOR) 10 MG tablet    Sig: Take 1 tablet (10 mg total) by mouth daily.    Dispense:  90 tablet    Refill:  3    Total time spent:35 Minutes Time spent includes review of chart, medications, test results, and follow up plan with the patient.   [place] Controlled Substance Database was reviewed by me.   Dr Lyndon Code Internal medicine

## 2021-10-07 ENCOUNTER — Telehealth: Payer: Self-pay

## 2021-10-07 NOTE — Telephone Encounter (Signed)
Sent community message to [employer] and beth at [facility] for order for portable INOGEN device

## 2021-10-13 ENCOUNTER — Telehealth: Payer: Self-pay

## 2021-10-13 ENCOUNTER — Other Ambulatory Visit: Payer: Self-pay | Admitting: Internal Medicine

## 2021-10-13 MED ORDER — BREZTRI AEROSPHERE 160-9-4.8 MCG/ACT IN AERO: 2.0000 | INHALATION_SPRAY | Freq: Two times a day (BID) | RESPIRATORY_TRACT | 3 refills | Status: DC

## 2021-10-13 NOTE — Telephone Encounter (Signed)
Pt was notified about his inhaler.

## 2021-10-13 NOTE — Telephone Encounter (Signed)
sent 

## 2021-10-21 ENCOUNTER — Encounter: Payer: Medicaid Other | Admitting: Physician Assistant

## 2021-10-27 ENCOUNTER — Ambulatory Visit: Payer: Self-pay | Admitting: Family Medicine

## 2021-11-04 ENCOUNTER — Ambulatory Visit (INDEPENDENT_AMBULATORY_CARE_PROVIDER_SITE_OTHER): Payer: Medicaid Other | Admitting: Physician Assistant

## 2021-11-04 ENCOUNTER — Encounter: Payer: Self-pay | Admitting: Physician Assistant

## 2021-11-04 VITALS — BP 130/80 | HR 76 | Temp 98.4°F | Resp 16 | Ht 68.0 in | Wt 187.4 lb

## 2021-11-04 DIAGNOSIS — J9611 Chronic respiratory failure with hypoxia: Secondary | ICD-10-CM | POA: Diagnosis not present

## 2021-11-04 DIAGNOSIS — J4489 Other specified chronic obstructive pulmonary disease: Secondary | ICD-10-CM | POA: Diagnosis not present

## 2021-11-04 DIAGNOSIS — F17219 Nicotine dependence, cigarettes, with unspecified nicotine-induced disorders: Secondary | ICD-10-CM

## 2021-11-04 DIAGNOSIS — Z0001 Encounter for general adult medical examination with abnormal findings: Secondary | ICD-10-CM

## 2021-11-04 MED ORDER — TRELEGY ELLIPTA 100-62.5-25 MCG/ACT IN AEPB: 1.0000 | INHALATION_SPRAY | Freq: Every day | RESPIRATORY_TRACT | 11 refills | Status: DC

## 2021-11-04 NOTE — Progress Notes (Signed)
[facility] [place], [place] [address]  Internal MEDICINE  Office Visit Note  Patient Name: Clifford Burgess  [id number]  [id number]  Date of Service: 11/04/2021  Chief Complaint  Patient presents with   Annual Exam   Gastroesophageal Reflux   COPD     HPI Pt is here for routine health maintenance examination -pt reports drinking alone last night--almost a case. Therefore feeling hungover this morning. Unsure why she drank this much and knows the dangers of drinking to excess. Denies any other substance use last night. Not interested in psych/addiction counseling at this time. -breathing has been ok, continues to use oxygen. Breztri not covered by insurance and needs alternative. Didn't like anoro because of powder. -smoking 1ppd and has been for >20years. Due to CT chest lung cancer screening and will order today -UTD on cologuard screening done in 2022  Current Medication: Outpatient Encounter Medications as of 11/04/2021  Medication Sig   albuterol (PROVENTIL) (2.5 MG/3ML) 0.083% nebulizer solution Take 3 mLs (2.5 mg total) by nebulization every 6 (six) hours as needed for wheezing or shortness of breath.   albuterol (VENTOLIN HFA) 108 (90 Base) MCG/ACT inhaler Inhale 2 puffs into the lungs every 6 (six) hours as needed for wheezing or shortness of breath.   aspirin EC 81 MG tablet Take 81 mg by mouth daily.   estradiol (ESTRACE) 1 MG tablet Take 1 tablet (1 mg total) by mouth daily.   Fluticasone-Umeclidin-Vilant (TRELEGY ELLIPTA) 100-62.5-25 MCG/ACT AEPB Inhale 1 puff into the lungs daily.   ipratropium-albuterol (DUONEB) 0.5-2.5 (3) MG/3ML SOLN TAKE 3 MLS BY NEBULIZATION EVERY 6 (SIX) HOURS AS NEEDED.   omeprazole (PRILOSEC) 20 MG capsule TAKE 1 CAPSULE BY MOUTH EVERY DAY   OXYGEN Inhale 2 L into the lungs. At night pt uses 24 hrs from [place] Pt   progesterone (PROMETRIUM) 100 MG capsule SMARTSIG:1 Capsule(s) By Mouth Every Evening    rosuvastatin (CRESTOR) 10 MG tablet Take 1 tablet (10 mg total) by mouth daily.   spironolactone (ALDACTONE) 50 MG tablet Take 1 tablet (50 mg total) by mouth 2 (two) times daily.   [DISCONTINUED] Budeson-Glycopyrrol-Formoterol (BREZTRI AEROSPHERE) 160-9-4.8 MCG/ACT AERO Inhale 2 puffs into the lungs 2 (two) times daily.   No facility-administered encounter medications on file as of 11/04/2021.    Surgical History: Past Surgical History:  Procedure Laterality Date   HERNIA REPAIR      Medical History: Past Medical History:  Diagnosis Date   Asthma    COPD (chronic obstructive pulmonary disease) (HCC)    GERD (gastroesophageal reflux disease)     Family History: Family History  Problem Relation Age of Onset   Hypertension Father       Review of Systems  Constitutional:  Negative for chills, fatigue and unexpected weight change.  HENT:  Positive for postnasal drip. Negative for congestion, rhinorrhea, sneezing and sore throat.   Eyes:  Negative for redness.  Respiratory:  Negative for cough and chest tightness.   Cardiovascular:  Negative for chest pain and palpitations.  Gastrointestinal:  Negative for abdominal pain, constipation, diarrhea, nausea and vomiting.  Genitourinary:  Negative for dysuria and frequency.  Musculoskeletal:  Negative for arthralgias, back pain, joint swelling and neck pain.  Skin:  Negative for rash.  Neurological: Negative.  Negative for tremors and numbness.  Hematological:  Negative for adenopathy. Does not bruise/bleed easily.  Psychiatric/Behavioral:  Negative for behavioral problems (Depression), sleep disturbance and suicidal ideas. The patient is not nervous/anxious.  Vital Signs: BP 130/80 Comment: 142/80  Pulse 76   Temp 98.4 F (36.9 C)   Resp 16   Ht 5\' 8"  (1.727 m)   Wt 187 lb 6.4 oz (85 kg)   SpO2 98%   BMI 28.49 kg/m    Physical Exam Vitals and nursing note reviewed.  Constitutional:      Appearance: Normal  appearance.  HENT:     Head: Normocephalic and atraumatic.     Nose: Nose normal.     Mouth/Throat:     Mouth: Mucous membranes are moist.     Pharynx: No posterior oropharyngeal erythema.  Eyes:     Extraocular Movements: Extraocular movements intact.     Pupils: Pupils are equal, round, and reactive to light.  Cardiovascular:     Rate and Rhythm: Normal rate and regular rhythm.     Pulses: Normal pulses.     Heart sounds: Normal heart sounds.  Pulmonary:     Effort: Pulmonary effort is normal.     Breath sounds: Normal breath sounds.  Abdominal:     General: Abdomen is flat.     Palpations: Abdomen is soft.     Tenderness: There is no abdominal tenderness.  Musculoskeletal:        General: Normal range of motion.  Skin:    General: Skin is warm and dry.  Neurological:     General: No focal deficit present.     Mental Status: He is alert.  Psychiatric:        Mood and Affect: Mood normal.        Behavior: Behavior normal.      LABS: Recent Results (from the past 2160 hour(s))  Miscellaneous [facility] test (send-out)     Status: None   Collection Time: 09/05/21 11:16 AM  Result Value Ref Range   [facility] test code 09/07/21    [facility] test name 17OH PROGESTERONE     Comment: Performed at [facility], [address], [place], [place]   Misc [facility] result COMMENT     Comment: (NOTE) Test Ordered: 59163 17-OH Progesterone LCMS 17-OH Progesterone LCMS        40               ng/dL    BN     Reference Range: 27-199                                This test was developed and its performance characteristics determined by [facility]. It has not been cleared or approved by the Food and Drug Administration. Performed At: [facility] [address] [place], [place] [id number] MD Jolene Schimke   Lipid panel     Status: Abnormal   Collection Time: 09/05/21 11:16 AM  Result Value Ref Range   Cholesterol 215 (H) 0 - 200 mg/dL    Triglycerides 09/07/21 762 mg/dL   HDL 50 <263 mg/dL   Total CHOL/HDL Ratio 4.3 RATIO   VLDL 22 0 - 40 mg/dL   LDL Cholesterol >33 (H) 0 - 99 mg/dL    Comment:        Total Cholesterol/HDL:CHD Risk Coronary Heart Disease Risk Table                     Men   Women  1/2 Average Risk   3.4   3.3  Average Risk  5.0   4.4  2 X Average Risk   9.6   7.1  3 X Average Risk  23.4   11.0        Use the calculated Patient Ratio above and the CHD Risk Table to determine the patient's CHD Risk.        ATP III CLASSIFICATION (LDL):  <100     mg/dL   Optimal  053-976  mg/dL   Near or Above                    Optimal  130-159  mg/dL   Borderline  734-193  mg/dL   High  >790     mg/dL   Very High Performed at [facility], [address] [place], [place] [address]   Candelaria Celeste and Total     Status: Abnormal   Collection Time: 09/05/21 11:16 AM  Result Value Ref Range   Testosterone 108 (L) 264 - 916 ng/dL    Comment: (NOTE) Adult male reference interval is based on a population of healthy nonobese males (BMI <30) between 10 and 24 years old. Travison, et.al. JCEM [id number]. PMID: 62229798.    Testosterone, Free 3.2 (L) 7.2 - 24.0 pg/mL    Comment: (NOTE) Performed At: [facility] [place] [address] [place], [place] [id number] Jolene Schimke MD [id number]   FSH/LH     Status: None   Collection Time: 09/05/21 11:16 AM  Result Value Ref Range   LH 4.1 1.7 - 8.6 mIU/mL   FSH 1.6 1.5 - 12.4 mIU/mL    Comment: (NOTE) Performed At: [facility] [address] [place], [place] [id number] Jolene Schimke MD [id number]   Estrogens, total     Status: Abnormal   Collection Time: 09/05/21 11:16 AM  Result Value Ref Range   Estrogen 668 (H) 56 - 213 pg/mL    Comment: (NOTE)                         Prepubertal            <40 Performed At: [facility] [address] [place], [place] [id number] Jolene Schimke MD [id number]   PSA      Status: None   Collection Time: 09/05/21 11:16 AM  Result Value Ref Range   Prostatic Specific Antigen 0.27 0.00 - 4.00 ng/mL    Comment: (NOTE) While PSA levels of <=4.0 ng/ml are reported as reference range, some men with levels below 4.0 ng/ml can have prostate cancer and many men with PSA above 4.0 ng/ml do not have prostate cancer.  Other tests such as free PSA, age specific reference ranges, PSA velocity and PSA doubling time may be helpful especially in men less than 67 years old. Performed at [facility], [address] [place], [place] [address]   TSH     Status: None   Collection Time: 09/05/21 11:16 AM  Result Value Ref Range   TSH 1.542 0.350 - 4.500 uIU/mL    Comment: Performed by a 3rd Generation assay with a functional sensitivity of <=0.01 uIU/mL. Performed at [facility], [address] [place], [place] [address]   T4, free     Status: None   Collection Time: 09/05/21 11:16 AM  Result Value Ref Range   Free T4 0.69 0.61 - 1.12 ng/dL    Comment: (NOTE) Biotin ingestion may interfere with free T4 tests. If the results are inconsistent with the TSH level,  previous test results, or the clinical presentation, then consider biotin interference. If needed, order repeat testing after stopping biotin. Performed at [facility], [address], [place], [place] [address]         Assessment/Plan: 1. Encounter for general adult medical examination with abnormal findings CPE performed, already had labs reviewed except for additional labs placed last visit that she will have done now. UTD on colon screening, due for lung cancer screening  2. Cigarette nicotine dependence with nicotine-induced disorder Continue to smoke 1ppd, will order lung cancer screening Ct chest - CT CHEST LUNG CA SCREEN LOW DOSE W/O CM; Future  3. Chronic respiratory failure with hypoxia (HCC) Continue oxygen as before, followed by pulmonology - CT CHEST LUNG CA  SCREEN LOW DOSE W/O CM; Future  4. Obstructive chronic bronchitis without exacerbation Will try to send alternative inhaler, followed by pulmonology     General Counseling: Lytle Michaels understanding of the findings of todays visit and agrees with plan of treatment. I have discussed any further diagnostic evaluation that may be needed or ordered today. We also reviewed his medications today. he has been encouraged to call the office with any questions or concerns that should arise related to todays visit.    Counseling:    Orders Placed This Encounter  Procedures   CT CHEST LUNG CA SCREEN LOW DOSE W/O CM    Meds ordered this encounter  Medications   Fluticasone-Umeclidin-Vilant (TRELEGY ELLIPTA) 100-62.5-25 MCG/ACT AEPB    Sig: Inhale 1 puff into the lungs daily.    Dispense:  1 each    Refill:  11    This patient was seen by Lynn Ito, PA-C in collaboration with Dr. Beverely Risen as a part of collaborative care agreement.  Total time spent:35 Minutes  Time spent includes review of chart, medications, test results, and follow up plan with the patient.     Lyndon Code, MD  Internal Medicine

## 2021-11-06 ENCOUNTER — Emergency Department: Payer: Medicaid Other

## 2021-11-06 ENCOUNTER — Encounter: Payer: Self-pay | Admitting: Emergency Medicine

## 2021-11-06 ENCOUNTER — Other Ambulatory Visit: Payer: Self-pay

## 2021-11-06 ENCOUNTER — Emergency Department
Admission: EM | Admit: 2021-11-06 | Discharge: 2021-11-06 | Disposition: A | Discharge status: Home / Self Care | Payer: Medicaid Other | Attending: Emergency Medicine | Admitting: Emergency Medicine

## 2021-11-06 DIAGNOSIS — S80812A Abrasion, left lower leg, initial encounter: Secondary | ICD-10-CM | POA: Insufficient documentation

## 2021-11-06 DIAGNOSIS — R079 Chest pain, unspecified: Secondary | ICD-10-CM | POA: Diagnosis not present

## 2021-11-06 DIAGNOSIS — S62617A Displaced fracture of proximal phalanx of left little finger, initial encounter for closed fracture: Secondary | ICD-10-CM | POA: Diagnosis not present

## 2021-11-06 DIAGNOSIS — Y92410 Unspecified street and highway as the place of occurrence of the external cause: Secondary | ICD-10-CM | POA: Diagnosis not present

## 2021-11-06 DIAGNOSIS — M25532 Pain in left wrist: Secondary | ICD-10-CM | POA: Diagnosis present

## 2021-11-06 DIAGNOSIS — Z23 Encounter for immunization: Secondary | ICD-10-CM | POA: Diagnosis not present

## 2021-11-06 DIAGNOSIS — S60512A Abrasion of left hand, initial encounter: Secondary | ICD-10-CM | POA: Insufficient documentation

## 2021-11-06 DIAGNOSIS — T07XXXA Unspecified multiple injuries, initial encounter: Secondary | ICD-10-CM

## 2021-11-06 MED ORDER — HYDROCODONE-ACETAMINOPHEN 5-325 MG PO TABS: 1.0000 | ORAL_TABLET | Freq: Four times a day (QID) | ORAL | 0 refills | Status: DC | PRN

## 2021-11-06 MED ORDER — TETANUS-DIPHTH-ACELL PERTUSSIS 5-2.5-18.5 LF-MCG/0.5 IM SUSY
0.5000 mL | PREFILLED_SYRINGE | Freq: Once | INTRAMUSCULAR | Status: AC
  Administered 2021-11-06: 0.5 mL via INTRAMUSCULAR
  Filled 2021-11-06: qty 0.5

## 2021-11-06 MED ORDER — LIDOCAINE HCL (PF) 1 % IJ SOLN
10.0000 mL | Freq: Once | INTRAMUSCULAR | Status: AC
  Administered 2021-11-06: 10 mL
  Filled 2021-11-06: qty 10

## 2021-11-06 MED ORDER — HYDROCODONE-ACETAMINOPHEN 5-325 MG PO TABS
1.0000 | ORAL_TABLET | Freq: Once | ORAL | Status: AC
  Administered 2021-11-06: 1 via ORAL
  Filled 2021-11-06: qty 1
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
## 2021-11-06 NOTE — ED Triage Notes (Signed)
Pt called for triage, no response. 
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
## 2021-11-06 NOTE — Discharge Instructions (Addendum)
Call the orthopedic department at [facility] tomorrow to see what date Dr. Posey Pronto wants to reevaluate your finger.  Leave the splint on until seen by the orthopedist.  Ice elevation to reduce swelling and help with pain.  Your CT scan and x-rays are negative for any other fractures or head injuries.  A prescription for hydrocodone was sent to the pharmacy to take as needed for pain.  Watch abrasions for any signs of infection.

## 2021-11-09 ENCOUNTER — Ambulatory Visit: Payer: Medicaid Other | Admitting: Internal Medicine

## 2021-11-10 ENCOUNTER — Encounter: Payer: Self-pay | Admitting: Orthopedic Surgery

## 2021-11-10 ENCOUNTER — Other Ambulatory Visit: Payer: Self-pay | Admitting: Orthopedic Surgery

## 2021-11-15 ENCOUNTER — Ambulatory Visit: Admission: RE | Admit: 2021-11-15 | Payer: Medicaid Other | Source: Home / Self Care | Admitting: Orthopedic Surgery

## 2021-11-15 SURGERY — CLOSED REDUCTION, FRACTURE, METACARPAL BONE, WITH PERCUTANEOUS PINNING
Anesthesia: Choice | Site: Finger | Laterality: Left

## 2021-11-23 ENCOUNTER — Ambulatory Visit: Payer: Medicaid Other | Admitting: Nurse Practitioner

## 2021-11-28 ENCOUNTER — Telehealth: Payer: Self-pay | Admitting: Internal Medicine

## 2021-11-28 NOTE — Telephone Encounter (Signed)
Left vm and sent mychart message to confirm 12/07/21 appointment-Toni 

## 2021-12-06 ENCOUNTER — Ambulatory Visit: Payer: Medicaid Other | Admitting: Internal Medicine

## 2021-12-07 ENCOUNTER — Ambulatory Visit: Payer: Medicaid Other | Admitting: Internal Medicine

## 2021-12-07 DIAGNOSIS — R0602 Shortness of breath: Secondary | ICD-10-CM

## 2021-12-07 DIAGNOSIS — J4489 Other specified chronic obstructive pulmonary disease: Secondary | ICD-10-CM

## 2021-12-12 ENCOUNTER — Ambulatory Visit
Admission: RE | Admit: 2021-12-12 | Discharge: 2021-12-12 | Disposition: A | Discharge status: Home / Self Care | Payer: Medicaid Other | Source: Ambulatory Visit | Attending: Physician Assistant | Admitting: Physician Assistant

## 2021-12-12 DIAGNOSIS — J9611 Chronic respiratory failure with hypoxia: Secondary | ICD-10-CM

## 2021-12-12 DIAGNOSIS — F17219 Nicotine dependence, cigarettes, with unspecified nicotine-induced disorders: Secondary | ICD-10-CM

## 2021-12-19 ENCOUNTER — Encounter: Payer: Self-pay | Admitting: Internal Medicine

## 2021-12-19 ENCOUNTER — Ambulatory Visit: Payer: Medicaid Other | Admitting: Internal Medicine

## 2021-12-19 VITALS — BP 130/81 | HR 78 | Temp 98.2°F | Resp 16 | Ht 68.0 in | Wt 189.8 lb

## 2021-12-19 DIAGNOSIS — R911 Solitary pulmonary nodule: Secondary | ICD-10-CM

## 2021-12-19 DIAGNOSIS — F17210 Nicotine dependence, cigarettes, uncomplicated: Secondary | ICD-10-CM | POA: Diagnosis not present

## 2021-12-19 DIAGNOSIS — J4489 Other specified chronic obstructive pulmonary disease: Secondary | ICD-10-CM

## 2021-12-19 DIAGNOSIS — J441 Chronic obstructive pulmonary disease with (acute) exacerbation: Secondary | ICD-10-CM

## 2021-12-19 DIAGNOSIS — F1910 Other psychoactive substance abuse, uncomplicated: Secondary | ICD-10-CM | POA: Diagnosis not present

## 2021-12-19 MED ORDER — IPRATROPIUM-ALBUTEROL 0.5-2.5 (3) MG/3ML IN SOLN: 3.0000 mL | Freq: Once | RESPIRATORY_TRACT | Status: DC

## 2021-12-19 MED ORDER — IPRATROPIUM-ALBUTEROL 0.5-2.5 (3) MG/3ML IN SOLN: 3.0000 mL | Freq: Four times a day (QID) | RESPIRATORY_TRACT | 1 refills | Status: DC | PRN

## 2021-12-19 MED ORDER — MOMETASONE FURO-FORMOTEROL FUM 100-5 MCG/ACT IN AERO: 2.0000 | INHALATION_SPRAY | Freq: Two times a day (BID) | RESPIRATORY_TRACT | 11 refills | Status: DC

## 2021-12-19 MED ORDER — SPIRIVA RESPIMAT 2.5 MCG/ACT IN AERS: 2.0000 | INHALATION_SPRAY | Freq: Every day | RESPIRATORY_TRACT | 6 refills | Status: DC

## 2021-12-19 NOTE — Progress Notes (Unsigned)
[facility] [address] [place], [place] [address]  Internal MEDICINE  Office Visit Note  Patient Name: Clifford Burgess  [id number]  [id number]  Date of Service: 12/19/2021  Chief Complaint  Patient presents with   Follow-up    Here to review CT and PFT   Quality Metric Gaps    Shingles Vaccine and Mammogram    HPI Patient is a transgender male here for routine follow-up 1.  Patient continues to smoke for this reason patient had low-dose lung screening CT scan done she is here to discuss the results 2.  She continues to smoke complains of cough and congestion insurance did not approve her Trelegy inhaler and has been only using rescue inhaler which she uses up to 4-8 times a day, she has also been out of her medication for nebulizer 3.  She is on hormone replacement therapy low-dose, she is high risk for her HRT due to smoking and age 5.  Patient has been on statin therapy as well 5.  Complains of shortness of breath cough has been on oxygen in the past    Current Medication: Outpatient Encounter Medications as of 12/19/2021  Medication Sig   albuterol (PROVENTIL) (2.5 MG/3ML) 0.083% nebulizer solution Take 3 mLs (2.5 mg total) by nebulization every 6 (six) hours as needed for wheezing or shortness of breath.   albuterol (VENTOLIN HFA) 108 (90 Base) MCG/ACT inhaler Inhale 2 puffs into the lungs every 6 (six) hours as needed for wheezing or shortness of breath.   aspirin EC 81 MG tablet Take 81 mg by mouth daily.   estradiol (ESTRACE) 1 MG tablet Take 1 tablet (1 mg total) by mouth daily.   HYDROcodone-acetaminophen (NORCO/VICODIN) 5-325 MG tablet Take 1 tablet by mouth every 6 (six) hours as needed.   ipratropium-albuterol (DUONEB) 0.5-2.5 (3) MG/3ML SOLN TAKE 3 MLS BY NEBULIZATION EVERY 6 (SIX) HOURS AS NEEDED.   mometasone-formoterol (DULERA) 100-5 MCG/ACT AERO Inhale 2 puffs into the lungs 2 (two) times daily.   omeprazole (PRILOSEC) 20 MG capsule TAKE 1  CAPSULE BY MOUTH EVERY DAY   OXYGEN Inhale 2 L into the lungs. At night pt uses 24 hrs from [facility] Pt   progesterone (PROMETRIUM) 100 MG capsule SMARTSIG:1 Capsule(s) By Mouth Every Evening   rosuvastatin (CRESTOR) 10 MG tablet Take 1 tablet (10 mg total) by mouth daily.   spironolactone (ALDACTONE) 50 MG tablet Take 1 tablet (50 mg total) by mouth 2 (two) times daily.   Tiotropium Bromide Monohydrate (SPIRIVA RESPIMAT) 2.5 MCG/ACT AERS Inhale 2 puffs into the lungs daily.   [DISCONTINUED] Fluticasone-Umeclidin-Vilant (TRELEGY ELLIPTA) 100-62.5-25 MCG/ACT AEPB Inhale 1 puff into the lungs daily.   No facility-administered encounter medications on file as of 12/19/2021.    Surgical History: Past Surgical History:  Procedure Laterality Date   HERNIA REPAIR      Medical History: Past Medical History:  Diagnosis Date   Asthma    COPD (chronic obstructive pulmonary disease) (HCC)    GERD (gastroesophageal reflux disease)     Family History: Family History  Problem Relation Age of Onset   Hypertension Father     Social History   Socioeconomic History   Marital status: Divorced    Spouse name: Not on file   Number of children: 2   Years of education: Not on file   Highest education level: 8th grade  Occupational History   Not on file  Tobacco Use   Smoking status: Every Day  Packs/day: 1.00    Years: 36.00    Total pack years: 36.00    Types: Cigarettes   Smokeless tobacco: Former    Types: Chew   Tobacco comments:    1 pack a day.  Started smoking age 71.  Vaping Use   Vaping Use: Never used  Substance and Sexual Activity   Alcohol use: Yes    Alcohol/week: 2.0 standard drinks of alcohol    Types: 2 Cans of beer per week    Comment: occasional   Drug use: Yes    Types: Cocaine, Marijuana, Heroin, LSD, "Crack" cocaine    Comment: 11/10/21  Pt reports drug use "last week"   Sexual activity: Not Currently  Other Topics Concern   Not on file  Social  History Narrative   Not on file   Social Determinants of Health   Financial Resource Strain: High Risk (11/27/2017)   Overall Financial Resource Strain (CARDIA)    Difficulty of Paying Living Expenses: Hard  Food Insecurity: Food Insecurity Present (11/27/2017)   Hunger Vital Sign    Worried About Running Out of Food in the Last Year: Often true    Ran Out of Food in the Last Year: Often true  Transportation Needs: No Transportation Needs (11/27/2017)   PRAPARE - Administrator, Civil Service (Medical): No    Lack of Transportation (Non-Medical): No  Physical Activity: Inactive (11/27/2017)   Exercise Vital Sign    Days of Exercise per Week: 0 days    Minutes of Exercise per Session: 0 min  Stress: No Stress Concern Present (11/27/2017)   [employer] of Occupational Health - Occupational Stress Questionnaire    Feeling of Stress : Not at all  Social Connections: Unknown (11/27/2017)   Social Connection and Isolation Panel [NHANES]    Frequency of Communication with Friends and Family: Not on file    Frequency of Social Gatherings with Friends and Family: Not on file    Attends Religious Services: Never    Active Member of Clubs or Organizations: No    Attends Banker Meetings: Never    Marital Status: Divorced  Catering manager Violence: Unknown (11/27/2017)   Humiliation, Afraid, Rape, and Kick questionnaire    Fear of Current or Ex-Partner: Not on file    Emotionally Abused: No    Physically Abused: No    Sexually Abused: No      Review of Systems  Constitutional:  Negative for chills, fatigue and unexpected weight change.  HENT:  Negative for congestion, postnasal drip, rhinorrhea, sneezing and sore throat.   Eyes:  Negative for redness.  Respiratory:  Positive for cough and shortness of breath. Negative for chest tightness.   Cardiovascular:  Negative for chest pain and palpitations.  Gastrointestinal:  Negative for abdominal pain,  constipation, diarrhea, nausea and vomiting.  Genitourinary:  Negative for dysuria and frequency.  Musculoskeletal:  Negative for arthralgias, back pain, joint swelling and neck pain.  Skin:  Negative for rash.  Neurological: Negative.  Negative for tremors and numbness.  Hematological:  Negative for adenopathy. Does not bruise/bleed easily.  Psychiatric/Behavioral:  Negative for behavioral problems (Depression), sleep disturbance and suicidal ideas. The patient is not nervous/anxious.     Vital Signs: BP 130/81   Pulse 78   Temp 98.2 F (36.8 C)   Resp 16   Ht 5\' 8"  (1.727 m)   Wt 189 lb 12.8 oz (86.1 kg)   SpO2 98%   BMI 28.86 kg/m  Physical Exam Constitutional:      Appearance: Normal appearance.  HENT:     Head: Normocephalic and atraumatic.     Nose: Nose normal.     Mouth/Throat:     Mouth: Mucous membranes are moist.     Pharynx: No posterior oropharyngeal erythema.  Eyes:     Extraocular Movements: Extraocular movements intact.     Pupils: Pupils are equal, round, and reactive to light.  Cardiovascular:     Pulses: Normal pulses.     Heart sounds: Normal heart sounds.  Pulmonary:     Effort: Pulmonary effort is normal.     Breath sounds: Wheezing, rhonchi and rales present.  Neurological:     General: No focal deficit present.     Mental Status: She is alert.  Psychiatric:        Mood and Affect: Mood normal.        Behavior: Behavior normal.        Assessment/Plan: 1. Acute exacerbation of chronic obstructive pulmonary disease (COPD) (HCC) Insurance has denied her Trelegy we will split her medication in [place] and Spiriva. Samples of DuoNeb was also given to the patient - mometasone-formoterol (DULERA) 100-5 MCG/ACT AERO; Inhale 2 puffs into the lungs 2 (two) times daily.  Dispense: 13 g; Refill: 11 - Tiotropium Bromide Monohydrate (SPIRIVA RESPIMAT) 2.5 MCG/ACT AERS; Inhale 2 puffs into the lungs daily.  Dispense: 4 g; Refill: 6 -  ipratropium-albuterol (DUONEB) 0.5-2.5 (3) MG/3ML SOLN; Take 3 mLs by nebulization every 6 (six) hours as needed.  Dispense: 360 mL; Refill: 1 - ipratropium-albuterol (DUONEB) 0.5-2.5 (3) MG/3ML nebulizer solution 3 mL  2. Lung nodule seen on imaging study CT lung is discussed patient does have a nodule which will need follow-up every year . Solid anterior right lower lobe pulmonary nodule along the major fissure measuring 5.6 mm in volume derived measuring diameter(series 3/image 168). 3. Continuous dependence on cigarette smoking Encourage smoking cessation  4. Drug abuse [facility]) Patient has high risk drug abuse behavior, encouraged to see psych  General Counseling: Lytle Michaels understanding of the findings of todays visit and agrees with plan of treatment. I have discussed any further diagnostic evaluation that may be needed or ordered today. We also reviewed her medications today. she has been encouraged to call the office with any questions or concerns that should arise related to todays visit.    No orders of the defined types were placed in this encounter.   Meds ordered this encounter  Medications   mometasone-formoterol (DULERA) 100-5 MCG/ACT AERO    Sig: Inhale 2 puffs into the lungs 2 (two) times daily.    Dispense:  13 g    Refill:  11   Tiotropium Bromide Monohydrate (SPIRIVA RESPIMAT) 2.5 MCG/ACT AERS    Sig: Inhale 2 puffs into the lungs daily.    Dispense:  4 g    Refill:  6    Total time spent:35 Minutes Time spent includes review of chart, medications, test results, and follow up plan with the patient.   [place] Controlled Substance Database was reviewed by me.   Dr Lyndon Code Internal medicine

## 2021-12-20 ENCOUNTER — Encounter: Payer: Self-pay | Admitting: Internal Medicine

## 2021-12-20 NOTE — Patient Instructions (Signed)
Sorry you

## 2021-12-22 NOTE — Procedures (Signed)
[facility] [address] [place], [address]    Complete Pulmonary Function Testing Interpretation:  FINDINGS:  The forced vital capacity is mildly decreased.  FEV1 is 2.48 L which is 68% of predicted and is mildly decreased.  FEV1 FVC ratio is mildly decreased.  Total lung capacity is normal residual volume is increased FRC is increased residual volume total lung capacity ratio is increased.  Postbronchodilator there was no significant improvement in the FEV1.  DLCO is decreased.  IMPRESSION:  This pulmonary function study is consistent with mild obstructive lung disease.  DLCO was decreased and can be abnormal in active smokers clinical correlation is recommended  Yevonne Pax, MD [facility] Pulmonary Critical Care Medicine Sleep Medicine

## 2021-12-27 LAB — PULMONARY FUNCTION TEST

## 2022-01-06 ENCOUNTER — Other Ambulatory Visit: Payer: Self-pay | Admitting: Nurse Practitioner

## 2022-01-06 ENCOUNTER — Other Ambulatory Visit: Payer: Self-pay | Admitting: Internal Medicine

## 2022-01-06 DIAGNOSIS — Z79899 Other long term (current) drug therapy: Secondary | ICD-10-CM

## 2022-01-06 DIAGNOSIS — K219 Gastro-esophageal reflux disease without esophagitis: Secondary | ICD-10-CM

## 2022-01-06 DIAGNOSIS — J449 Chronic obstructive pulmonary disease, unspecified: Secondary | ICD-10-CM

## 2022-02-07 ENCOUNTER — Other Ambulatory Visit: Payer: Self-pay

## 2022-02-07 DIAGNOSIS — J441 Chronic obstructive pulmonary disease with (acute) exacerbation: Secondary | ICD-10-CM

## 2022-02-07 MED ORDER — MOMETASONE FURO-FORMOTEROL FUM 100-5 MCG/ACT IN AERO: 2.0000 | INHALATION_SPRAY | Freq: Two times a day (BID) | RESPIRATORY_TRACT | 11 refills | Status: DC

## 2022-02-07 MED ORDER — SPIRIVA RESPIMAT 2.5 MCG/ACT IN AERS: 2.0000 | INHALATION_SPRAY | Freq: Every day | RESPIRATORY_TRACT | 6 refills | Status: DC

## 2022-03-15 ENCOUNTER — Other Ambulatory Visit: Payer: Self-pay | Admitting: Nurse Practitioner

## 2022-03-15 DIAGNOSIS — J449 Chronic obstructive pulmonary disease, unspecified: Secondary | ICD-10-CM

## 2022-03-20 ENCOUNTER — Ambulatory Visit: Payer: Medicaid Other | Admitting: Physician Assistant

## 2022-03-20 ENCOUNTER — Encounter: Payer: Self-pay | Admitting: Physician Assistant

## 2022-03-20 VITALS — BP 133/77 | HR 73 | Temp 97.7°F | Resp 16 | Ht 68.0 in | Wt 206.4 lb

## 2022-03-20 DIAGNOSIS — J4489 Other specified chronic obstructive pulmonary disease: Secondary | ICD-10-CM | POA: Diagnosis not present

## 2022-03-20 DIAGNOSIS — R5383 Other fatigue: Secondary | ICD-10-CM

## 2022-03-20 DIAGNOSIS — Z79890 Hormone replacement therapy (postmenopausal): Secondary | ICD-10-CM

## 2022-03-20 DIAGNOSIS — E782 Mixed hyperlipidemia: Secondary | ICD-10-CM

## 2022-03-20 DIAGNOSIS — F17219 Nicotine dependence, cigarettes, with unspecified nicotine-induced disorders: Secondary | ICD-10-CM

## 2022-03-20 DIAGNOSIS — E538 Deficiency of other specified B group vitamins: Secondary | ICD-10-CM

## 2022-03-20 DIAGNOSIS — Z125 Encounter for screening for malignant neoplasm of prostate: Secondary | ICD-10-CM

## 2022-03-20 MED ORDER — ESTRADIOL 1 MG PO TABS: 1.0000 mg | ORAL_TABLET | Freq: Every day | ORAL | 1 refills | Status: DC

## 2022-03-20 MED ORDER — MOMETASONE FURO-FORMOTEROL FUM 100-5 MCG/ACT IN AERO: 2.0000 | INHALATION_SPRAY | Freq: Two times a day (BID) | RESPIRATORY_TRACT | 11 refills | Status: DC

## 2022-03-20 MED ORDER — ROSUVASTATIN CALCIUM 10 MG PO TABS: 10.0000 mg | ORAL_TABLET | Freq: Every day | ORAL | 3 refills | Status: DC

## 2022-03-20 MED ORDER — BUPROPION HCL ER (XL) 150 MG PO TB24: 150.0000 mg | ORAL_TABLET | Freq: Every day | ORAL | 1 refills | Status: DC

## 2022-03-20 MED ORDER — ALBUTEROL SULFATE HFA 108 (90 BASE) MCG/ACT IN AERS: 2.0000 | INHALATION_SPRAY | Freq: Four times a day (QID) | RESPIRATORY_TRACT | 3 refills | Status: DC | PRN

## 2022-03-20 MED ORDER — SPIRIVA RESPIMAT 2.5 MCG/ACT IN AERS: 2.0000 | INHALATION_SPRAY | Freq: Every day | RESPIRATORY_TRACT | 6 refills | Status: DC

## 2022-03-20 NOTE — Progress Notes (Signed)
[facility] [place], [place] [address]  Internal MEDICINE  Office Visit Note  Patient Name: Clifford Burgess  [id number]  [id number]  Date of Service: 03/20/2022  Chief Complaint  Patient presents with   Follow-up   Gastroesophageal Reflux   Medication Refill    Inhaler and Estradiol   Quality Metric Gaps    Shingles Vaccines    HPI Pt is here for routine follow up, her cousin is with her today -Breathing has been ok, does need refill of all inhalers -Needs estradiol refill transferred to [facility]. Does inquire about increasing dose. As previously discussed she is high risk on HRT given smoking and likely will not increase this dose. She is aware of risks. Due for labs -BP stable -Continues to smoke 1ppd, open to trying wellbutrin to help. She thinks she took this previously but at very high doses and felt like a zombie. Discussed this should only be taken at prescribed dose once per day.   Current Medication: Outpatient Encounter Medications as of 03/20/2022  Medication Sig   aspirin EC 81 MG tablet Take 81 mg by mouth daily.   buPROPion (WELLBUTRIN XL) 150 MG 24 hr tablet Take 1 tablet (150 mg total) by mouth daily.   HYDROcodone-acetaminophen (NORCO/VICODIN) 5-325 MG tablet Take 1 tablet by mouth every 6 (six) hours as needed.   ipratropium-albuterol (DUONEB) 0.5-2.5 (3) MG/3ML SOLN Take 3 mLs by nebulization every 6 (six) hours as needed.   omeprazole (PRILOSEC) 20 MG capsule TAKE 1 CAPSULE BY MOUTH EVERY DAY   OXYGEN Inhale 2 L into the lungs. At night pt uses 24 hrs from [facility]   progesterone (PROMETRIUM) 100 MG capsule SMARTSIG:1 Capsule(s) By Mouth Every Evening   spironolactone (ALDACTONE) 50 MG tablet Take 1 tablet (50 mg total) by mouth 2 (two) times daily.   [DISCONTINUED] albuterol (VENTOLIN HFA) 108 (90 Base) MCG/ACT inhaler Inhale 2 puffs into the lungs every 6 (six) hours as needed for wheezing or shortness of breath.    [DISCONTINUED] estradiol (ESTRACE) 1 MG tablet TAKE 1 TABLET BY MOUTH EVERY DAY   [DISCONTINUED] mometasone-formoterol (DULERA) 100-5 MCG/ACT AERO Inhale 2 puffs into the lungs 2 (two) times daily.   [DISCONTINUED] rosuvastatin (CRESTOR) 10 MG tablet Take 1 tablet (10 mg total) by mouth daily.   [DISCONTINUED] Tiotropium Bromide Monohydrate (SPIRIVA RESPIMAT) 2.5 MCG/ACT AERS Inhale 2 puffs into the lungs daily.   albuterol (VENTOLIN HFA) 108 (90 Base) MCG/ACT inhaler Inhale 2 puffs into the lungs every 6 (six) hours as needed for wheezing or shortness of breath.   estradiol (ESTRACE) 1 MG tablet Take 1 tablet (1 mg total) by mouth daily.   mometasone-formoterol (DULERA) 100-5 MCG/ACT AERO Inhale 2 puffs into the lungs 2 (two) times daily.   rosuvastatin (CRESTOR) 10 MG tablet Take 1 tablet (10 mg total) by mouth daily.   Tiotropium Bromide Monohydrate (SPIRIVA RESPIMAT) 2.5 MCG/ACT AERS Inhale 2 puffs into the lungs daily.   Facility-Administered Encounter Medications as of 03/20/2022  Medication   ipratropium-albuterol (DUONEB) 0.5-2.5 (3) MG/3ML nebulizer solution 3 mL    Surgical History: Past Surgical History:  Procedure Laterality Date   HERNIA REPAIR      Medical History: Past Medical History:  Diagnosis Date   Asthma    COPD (chronic obstructive pulmonary disease) (HCC)    GERD (gastroesophageal reflux disease)     Family History: Family History  Problem Relation Age of Onset   Hypertension Father  Social History   Socioeconomic History   Marital status: Divorced    Spouse name: Not on file   Number of children: 2   Years of education: Not on file   Highest education level: 8th grade  Occupational History   Not on file  Tobacco Use   Smoking status: Every Day    Packs/day: 1.00    Years: 36.00    Total pack years: 36.00    Types: Cigarettes   Smokeless tobacco: Former    Types: Chew   Tobacco comments:    1/2 - 1 pack a day.  Started smoking age 79.   Vaping Use   Vaping Use: Never used  Substance and Sexual Activity   Alcohol use: Yes    Alcohol/week: 2.0 standard drinks of alcohol    Types: 2 Cans of beer per week    Comment: occasional   Drug use: Yes    Types: Cocaine, Marijuana, Heroin, LSD, "Crack" cocaine    Comment: 11/10/21  Pt reports drug use "last week"   Sexual activity: Not Currently  Other Topics Concern   Not on file  Social History Narrative   Not on file   Social Determinants of Health   Financial Resource Strain: High Risk (11/27/2017)   Overall Financial Resource Strain (CARDIA)    Difficulty of Paying Living Expenses: Hard  Food Insecurity: Food Insecurity Present (11/27/2017)   Hunger Vital Sign    Worried About Running Out of Food in the Last Year: Often true    Ran Out of Food in the Last Year: Often true  Transportation Needs: No Transportation Needs (11/27/2017)   PRAPARE - Hydrologist (Medical): No    Lack of Transportation (Non-Medical): No  Physical Activity: Inactive (11/27/2017)   Exercise Vital Sign    Days of Exercise per Week: 0 days    Minutes of Exercise per Session: 0 min  Stress: No Stress Concern Present (11/27/2017)   [place]    Feeling of Stress : Not at all  Social Connections: Unknown (11/27/2017)   Social Connection and Isolation Panel [NHANES]    Frequency of Communication with Friends and Family: Not on file    Frequency of Social Gatherings with Friends and Family: Not on file    Attends Religious Services: Never    Active Member of Clubs or Organizations: No    Attends Archivist Meetings: Never    Marital Status: Divorced  Human resources officer Violence: Unknown (11/27/2017)   Humiliation, Afraid, Rape, and Kick questionnaire    Fear of Current or Ex-Partner: Not on file    Emotionally Abused: No    Physically Abused: No    Sexually Abused: No      Review of  Systems  Constitutional:  Negative for chills, fatigue and unexpected weight change.  HENT:  Positive for postnasal drip. Negative for congestion, rhinorrhea, sneezing and sore throat.   Eyes:  Negative for redness.  Respiratory:  Negative for cough and chest tightness.   Cardiovascular:  Negative for chest pain and palpitations.  Gastrointestinal:  Negative for abdominal pain, constipation, diarrhea, nausea and vomiting.  Genitourinary:  Negative for dysuria and frequency.  Musculoskeletal:  Negative for arthralgias, back pain, joint swelling and neck pain.  Skin:  Negative for rash.  Neurological: Negative.  Negative for tremors and numbness.  Hematological:  Negative for adenopathy. Does not bruise/bleed easily.  Psychiatric/Behavioral:  Negative for behavioral problems (Depression),  sleep disturbance and suicidal ideas. The patient is not nervous/anxious.     Vital Signs: BP 133/77   Pulse 73   Temp 97.7 F (36.5 C)   Resp 16   Ht '5\' 8"'$  (1.727 m)   Wt 206 lb 6.4 oz (93.6 kg)   SpO2 97%   BMI 31.38 kg/m    Physical Exam Vitals and nursing note reviewed.  Constitutional:      Appearance: Normal appearance.  HENT:     Head: Normocephalic and atraumatic.     Nose: Nose normal.     Mouth/Throat:     Mouth: Mucous membranes are moist.     Pharynx: No posterior oropharyngeal erythema.  Eyes:     Extraocular Movements: Extraocular movements intact.     Pupils: Pupils are equal, round, and reactive to light.  Cardiovascular:     Rate and Rhythm: Normal rate and regular rhythm.     Pulses: Normal pulses.     Heart sounds: Normal heart sounds.  Pulmonary:     Effort: Pulmonary effort is normal.     Breath sounds: Rhonchi present.  Abdominal:     General: Abdomen is flat.     Palpations: Abdomen is soft.  Musculoskeletal:        General: Normal range of motion.  Skin:    General: Skin is warm and dry.  Neurological:     General: No focal deficit present.     Mental  Status: She is alert.  Psychiatric:        Mood and Affect: Mood normal.        Behavior: Behavior normal.        Assessment/Plan: 1. Obstructive chronic bronchitis without exacerbation Stable, continue inhalers as before - mometasone-formoterol (DULERA) 100-5 MCG/ACT AERO; Inhale 2 puffs into the lungs 2 (two) times daily.  Dispense: 13 g; Refill: 11 - Tiotropium Bromide Monohydrate (SPIRIVA RESPIMAT) 2.5 MCG/ACT AERS; Inhale 2 puffs into the lungs daily.  Dispense: 4 g; Refill: 6 - albuterol (VENTOLIN HFA) 108 (90 Base) MCG/ACT inhaler; Inhale 2 puffs into the lungs every 6 (six) hours as needed for wheezing or shortness of breath.  Dispense: 18 g; Refill: 3  2. Hormone replacement therapy Will update labs, discussed high risk on HRT - Estrogens, total - FSH/LH - Testosterone,Free and Total - Lipid Panel With LDL/HDL Ratio - estradiol (ESTRACE) 1 MG tablet; Take 1 tablet (1 mg total) by mouth daily.  Dispense: 90 tablet; Refill: 1  3. Cigarette nicotine dependence with nicotine-induced disorder Will start wellbutrin - buPROPion (WELLBUTRIN XL) 150 MG 24 hr tablet; Take 1 tablet (150 mg total) by mouth daily.  Dispense: 90 tablet; Refill: 1  4. Mixed hyperlipidemia - rosuvastatin (CRESTOR) 10 MG tablet; Take 1 tablet (10 mg total) by mouth daily.  Dispense: 90 tablet; Refill: 3  5. B12 deficiency - B12 and Folate Panel  6. Screening for prostate cancer - PSA Total (Reflex To Free)  7. Other fatigue - Fe+TIBC+Fer - B12 and Folate Panel - PSA Total (Reflex To Free)   General Counseling: Bebe Shaggy understanding of the findings of todays visit and agrees with plan of treatment. I have discussed any further diagnostic evaluation that may be needed or ordered today. We also reviewed her medications today. she has been encouraged to call the office with any questions or concerns that should arise related to todays visit.    Orders Placed This Encounter  Procedures    Estrogens, total   FSH/LH  Testosterone,Free and Total   Lipid Panel With LDL/HDL Ratio   Fe+TIBC+Fer   B12 and Folate Panel   PSA Total (Reflex To Free)    Meds ordered this encounter  Medications   mometasone-formoterol (DULERA) 100-5 MCG/ACT AERO    Sig: Inhale 2 puffs into the lungs 2 (two) times daily.    Dispense:  13 g    Refill:  11   Tiotropium Bromide Monohydrate (SPIRIVA RESPIMAT) 2.5 MCG/ACT AERS    Sig: Inhale 2 puffs into the lungs daily.    Dispense:  4 g    Refill:  6   albuterol (VENTOLIN HFA) 108 (90 Base) MCG/ACT inhaler    Sig: Inhale 2 puffs into the lungs every 6 (six) hours as needed for wheezing or shortness of breath.    Dispense:  18 g    Refill:  3    Ok to fill proair   rosuvastatin (CRESTOR) 10 MG tablet    Sig: Take 1 tablet (10 mg total) by mouth daily.    Dispense:  90 tablet    Refill:  3   buPROPion (WELLBUTRIN XL) 150 MG 24 hr tablet    Sig: Take 1 tablet (150 mg total) by mouth daily.    Dispense:  90 tablet    Refill:  1   estradiol (ESTRACE) 1 MG tablet    Sig: Take 1 tablet (1 mg total) by mouth daily.    Dispense:  90 tablet    Refill:  1    This patient was seen by Drema Dallas, PA-C in collaboration with Dr. Clayborn Bigness as a part of collaborative care agreement.   Total time spent:30 Minutes Time spent includes review of chart, medications, test results, and follow up plan with the patient.      Dr Lavera Guise Internal medicine

## 2022-03-21 ENCOUNTER — Other Ambulatory Visit
Admission: RE | Admit: 2022-03-21 | Discharge: 2022-03-21 | Disposition: A | Discharge status: Home / Self Care | Payer: Medicaid Other | Attending: Physician Assistant | Admitting: Physician Assistant

## 2022-03-21 DIAGNOSIS — E538 Deficiency of other specified B group vitamins: Secondary | ICD-10-CM | POA: Diagnosis not present

## 2022-03-21 DIAGNOSIS — Z79890 Hormone replacement therapy (postmenopausal): Secondary | ICD-10-CM | POA: Diagnosis not present

## 2022-03-21 DIAGNOSIS — Z125 Encounter for screening for malignant neoplasm of prostate: Secondary | ICD-10-CM | POA: Diagnosis not present

## 2022-03-21 DIAGNOSIS — R5383 Other fatigue: Secondary | ICD-10-CM | POA: Insufficient documentation

## 2022-03-21 LAB — LIPID PANEL
Cholesterol: 162 mg/dL (ref 0–200)
HDL: 70 mg/dL (ref >40)
LDL Cholesterol: 70 mg/dL (ref 0–99)
Total CHOL/HDL Ratio: 2.3 RATIO
Triglycerides: 111 mg/dL (ref <150)
VLDL: 22 mg/dL (ref 0–40)

## 2022-03-21 LAB — IRON AND TIBC
Iron: 97 ug/dL (ref 45–182)
Saturation Ratios: 27 % (ref 17.9–39.5)
TIBC: 364 ug/dL (ref 250–450)
UIBC: 267 ug/dL

## 2022-03-21 LAB — VITAMIN B12: Vitamin B-12: 147 pg/mL — ABNORMAL LOW (ref 180–914)

## 2022-03-21 LAB — FERRITIN: Ferritin: 70 ng/mL (ref 24–336)

## 2022-03-21 LAB — FOLATE: Folate: 13.8 ng/mL (ref >5.9)

## 2022-03-23 LAB — PSA (REFLEX TO FREE) (SERIAL): Prostate Specific Ag, Serum: 1.5 ng/mL (ref 0.0–4.0)

## 2022-03-23 LAB — FSH/LH
FSH: 8.9 m[IU]/mL (ref 1.5–12.4)
LH: 22.7 m[IU]/mL — ABNORMAL HIGH (ref 1.7–8.6)

## 2022-03-23 LAB — ESTROGENS, TOTAL: Estrogen: 185 pg/mL (ref 56–213)

## 2022-03-23 LAB — TESTOSTERONE,FREE AND TOTAL
Testosterone, Free: 13.4 pg/mL (ref 7.2–24.0)
Testosterone: 565 ng/dL (ref 264–916)

## 2022-03-27 ENCOUNTER — Emergency Department
Admission: EM | Admit: 2022-03-27 | Discharge: 2022-03-28 | Disposition: A | Discharge status: Home / Self Care | Payer: Medicaid Other | Attending: Emergency Medicine | Admitting: Emergency Medicine

## 2022-03-27 ENCOUNTER — Other Ambulatory Visit: Payer: Self-pay

## 2022-03-27 DIAGNOSIS — K5900 Constipation, unspecified: Secondary | ICD-10-CM | POA: Diagnosis not present

## 2022-03-27 DIAGNOSIS — R1032 Left lower quadrant pain: Secondary | ICD-10-CM | POA: Insufficient documentation

## 2022-03-27 LAB — COMPREHENSIVE METABOLIC PANEL
ALT: 14 U/L (ref 0–44)
AST: 19 U/L (ref 15–41)
Albumin: 3.9 g/dL (ref 3.5–5.0)
Alkaline Phosphatase: 56 U/L (ref 38–126)
Anion gap: 9 (ref 5–15)
BUN: 11 mg/dL (ref 6–20)
CO2: 26 mmol/L (ref 22–32)
Calcium: 8.8 mg/dL — ABNORMAL LOW (ref 8.9–10.3)
Chloride: 100 mmol/L (ref 98–111)
Creatinine, Ser: 0.94 mg/dL (ref 0.61–1.24)
GFR, Estimated: >60 mL/min (ref >60)
Glucose, Bld: 121 mg/dL — ABNORMAL HIGH (ref 70–99)
Potassium: 3.8 mmol/L (ref 3.5–5.1)
Sodium: 135 mmol/L (ref 135–145)
Total Bilirubin: 0.7 mg/dL (ref 0.3–1.2)
Total Protein: 7.2 g/dL (ref 6.5–8.1)

## 2022-03-27 LAB — URINALYSIS, ROUTINE W REFLEX MICROSCOPIC
Bilirubin Urine: NEGATIVE
Glucose, UA: NEGATIVE mg/dL
Hgb urine dipstick: NEGATIVE
Ketones, ur: NEGATIVE mg/dL
Leukocytes,Ua: NEGATIVE
Nitrite: NEGATIVE
Protein, ur: NEGATIVE mg/dL
Specific Gravity, Urine: 1.011 (ref 1.005–1.030)
pH: 6 (ref 5.0–8.0)

## 2022-03-27 LAB — CBC
HCT: 44.3 % (ref 39.0–52.0)
Hemoglobin: 14.4 g/dL (ref 13.0–17.0)
MCH: 30.1 pg (ref 26.0–34.0)
MCHC: 32.5 g/dL (ref 30.0–36.0)
MCV: 92.7 fL (ref 80.0–100.0)
Platelets: 376 10*3/uL (ref 150–400)
RBC: 4.78 MIL/uL (ref 4.22–5.81)
RDW: 14 % (ref 11.5–15.5)
WBC: 11.2 10*3/uL — ABNORMAL HIGH (ref 4.0–10.5)
nRBC: 0 % (ref 0.0–0.2)

## 2022-03-27 LAB — LIPASE, BLOOD: Lipase: 26 U/L (ref 11–51)

## 2022-03-27 MED ORDER — ONDANSETRON HCL 4 MG/2ML IJ SOLN
4.0000 mg | Freq: Once | INTRAMUSCULAR | Status: AC
  Administered 2022-03-27: 4 mg via INTRAVENOUS
  Filled 2022-03-27: qty 2

## 2022-03-27 MED ORDER — LACTATED RINGERS IV BOLUS
1000.0000 mL | Freq: Once | INTRAVENOUS | Status: AC
  Administered 2022-03-28: 1000 mL via INTRAVENOUS

## 2022-03-27 MED ORDER — MORPHINE SULFATE (PF) 4 MG/ML IV SOLN
4.0000 mg | Freq: Once | INTRAVENOUS | Status: AC
  Administered 2022-03-27: 4 mg via INTRAVENOUS
  Filled 2022-03-27: qty 1

## 2022-03-27 NOTE — ED Notes (Signed)
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  Pt refusing further attempts for blood draw at this time.  Pt told that blood would eventually be needed for workup and states understanding.

## 2022-03-27 NOTE — ED Provider Notes (Signed)
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  Patient reports that she has been dealing with constant sharp pain in the left lower quadrant of her abdomen since yesterday.  Pain does not seem to be exacerbated or alleviated by anything in particular, has been associated with nausea and vomiting, but she denies any diarrhea.  She has not had any fevers, dysuria, hematuria, or flank pain.  She denies any history of similar symptoms, does report hernia repair with mesh placement in 2016.     Physical Exam   Triage Vital Signs: ED Triage Vitals  Enc Vitals Group     BP 03/27/22 1933 123/62     Pulse Rate 03/27/22 1933 88     Resp 03/27/22 1933 19     Temp 03/27/22 1933 97.8 F (36.6 C)     Temp Source 03/27/22 1933 Oral     SpO2 03/27/22 1933 95 %     Weight --      Height --      Head Circumference --      Peak Flow --      Pain Score 03/27/22 1935 8     Pain Loc --      Pain Edu? --      Excl. in [place]? --     Most recent vital signs: Vitals:   03/27/22 1933  BP: 123/62  Pulse: 88  Resp: 19  Temp: 97.8 F (36.6 C)  SpO2: 95%    Constitutional: Alert and oriented. Eyes: Conjunctivae are normal. Head: Atraumatic. Nose: No congestion/rhinnorhea. Mouth/Throat: Mucous membranes are moist.  Cardiovascular: Normal rate, regular rhythm. Grossly normal heart sounds.  2+ radial pulses bilaterally. Respiratory: Normal respiratory effort.  No retractions. Lungs CTAB. Gastrointestinal: Soft and tender to palpation in the left lower quadrant with no rebound or guarding. No distention. Musculoskeletal: No lower extremity tenderness nor edema.  Neurologic:  Normal speech and language. No gross  focal neurologic deficits are appreciated.    ED Results / Procedures / Treatments   Labs (all labs ordered are listed, but only abnormal results are displayed) Labs Reviewed  CBC  COMPREHENSIVE METABOLIC PANEL  URINALYSIS, ROUTINE W REFLEX MICROSCOPIC  LIPASE, BLOOD    PROCEDURES:  Critical Care performed: No  Procedures   MEDICATIONS ORDERED IN ED: Medications  morphine (PF) 4 MG/ML injection 4 mg (has no administration in time range)  ondansetron (ZOFRAN) injection 4 mg (has no administration in time range)  lactated ringers bolus 1,000 mL (has no administration in time range)     IMPRESSION / MDM / ASSESSMENT AND PLAN / ED COURSE  I reviewed the triage vital signs and the nursing notes.                              52 y.o. adult male who identifies as male who presents to the ED complaining of constant left lower quadrant abdominal pain associated with nausea and vomiting since yesterday.  Patient's presentation is most consistent with acute presentation with potential threat to life or bodily function.  Differential diagnosis includes, but is not limited to, diverticulitis, abscess, kidney stone, UTI,  colitis, gastroenteritis, electrolyte abnormality, AKI, UTI.  Patient nontoxic-appearing and in no acute distress, vital signs are unremarkable.  Patient does have focal tenderness to palpation of the left lower quadrant of the abdomen and we will further assess with CT scan for diverticulitis or other explanation for pain.  Labs are pending at this time, will treat symptomatically with IV morphine and Zofran, hydrate with IV fluids.  Patient turned over to oncoming divider pending lab and imaging results.      FINAL CLINICAL IMPRESSION(S) / ED DIAGNOSES   Final diagnoses:  LLQ abdominal pain     Rx / DC Orders   ED Discharge Orders     None        Note:  This document was prepared using Dragon voice recognition software and may include  unintentional dictation errors.   Blake Divine, MD 03/27/22 2252

## 2022-03-27 NOTE — ED Triage Notes (Signed)
Pt presents to ER with c/o LLQ abd pain that started yesterday.  Pt states abd pain has become worse since yesterday.  Pt endorses n/v but denies any diarrhea.  Pt states pain is non-radiating and intermittent in nature.  Pt states she has hx of hernia mesh repair back in 2016.  Pt is otherwise A&O x4 and in NAD at this time.

## 2022-03-28 ENCOUNTER — Emergency Department: Payer: Medicaid Other

## 2022-03-28 MED ORDER — LACTULOSE 10 GM/15ML PO SOLN: 20.0000 g | Freq: Every day | ORAL | 0 refills | Status: DC | PRN

## 2022-03-28 MED ORDER — ONDANSETRON 4 MG PO TBDP: 4.0000 mg | ORAL_TABLET | Freq: Three times a day (TID) | ORAL | 0 refills | Status: AC | PRN

## 2022-03-28 MED ORDER — DICYCLOMINE HCL 20 MG PO TABS: 20.0000 mg | ORAL_TABLET | Freq: Four times a day (QID) | ORAL | 0 refills | Status: DC

## 2022-03-28 MED ORDER — IOHEXOL 300 MG/ML  SOLN
100.0000 mL | Freq: Once | INTRAMUSCULAR | Status: AC | PRN
  Administered 2022-03-28: 100 mL via INTRAVENOUS

## 2022-03-28 NOTE — Discharge Instructions (Signed)
You may take Lactulose as needed for bowel movements.  You may take medicines as needed for abdominal discomfort and nausea (Bentyl/Zofran #20).  Return to the ER for worsening symptoms, persistent vomiting, difficulty breathing or other concerns.

## 2022-03-28 NOTE — ED Provider Notes (Signed)
-----------------------------------------   1:02 AM on 03/28/2022 -----------------------------------------   Updated patient on all laboratory and imaging results.  Will discharge home on as needed lactulose, Bentyl and Zofran.  Strict return precautions given.  Patient and family member verbalized understanding agree with plan of care.  CT abdomen pelvis interpreted per Dr. Ardeen Garland: No acute findings in the abdomen or pelvis.    Paulette Blanch, MD 03/28/22 [phone]

## 2022-03-28 NOTE — ED Notes (Signed)
Per Pt she wears 2LNC d/t COPD. Placed pt on 2L

## 2022-04-18 IMAGING — DX DG ANKLE COMPLETE 3+V*L*
3 series · 3 of 3 positions shown · non-contrast
Comparison: None.

CLINICAL DATA: Posttraumatic left foot pain

EXAM:
LEFT ANKLE COMPLETE - 3+ VIEW

[ankle ap]
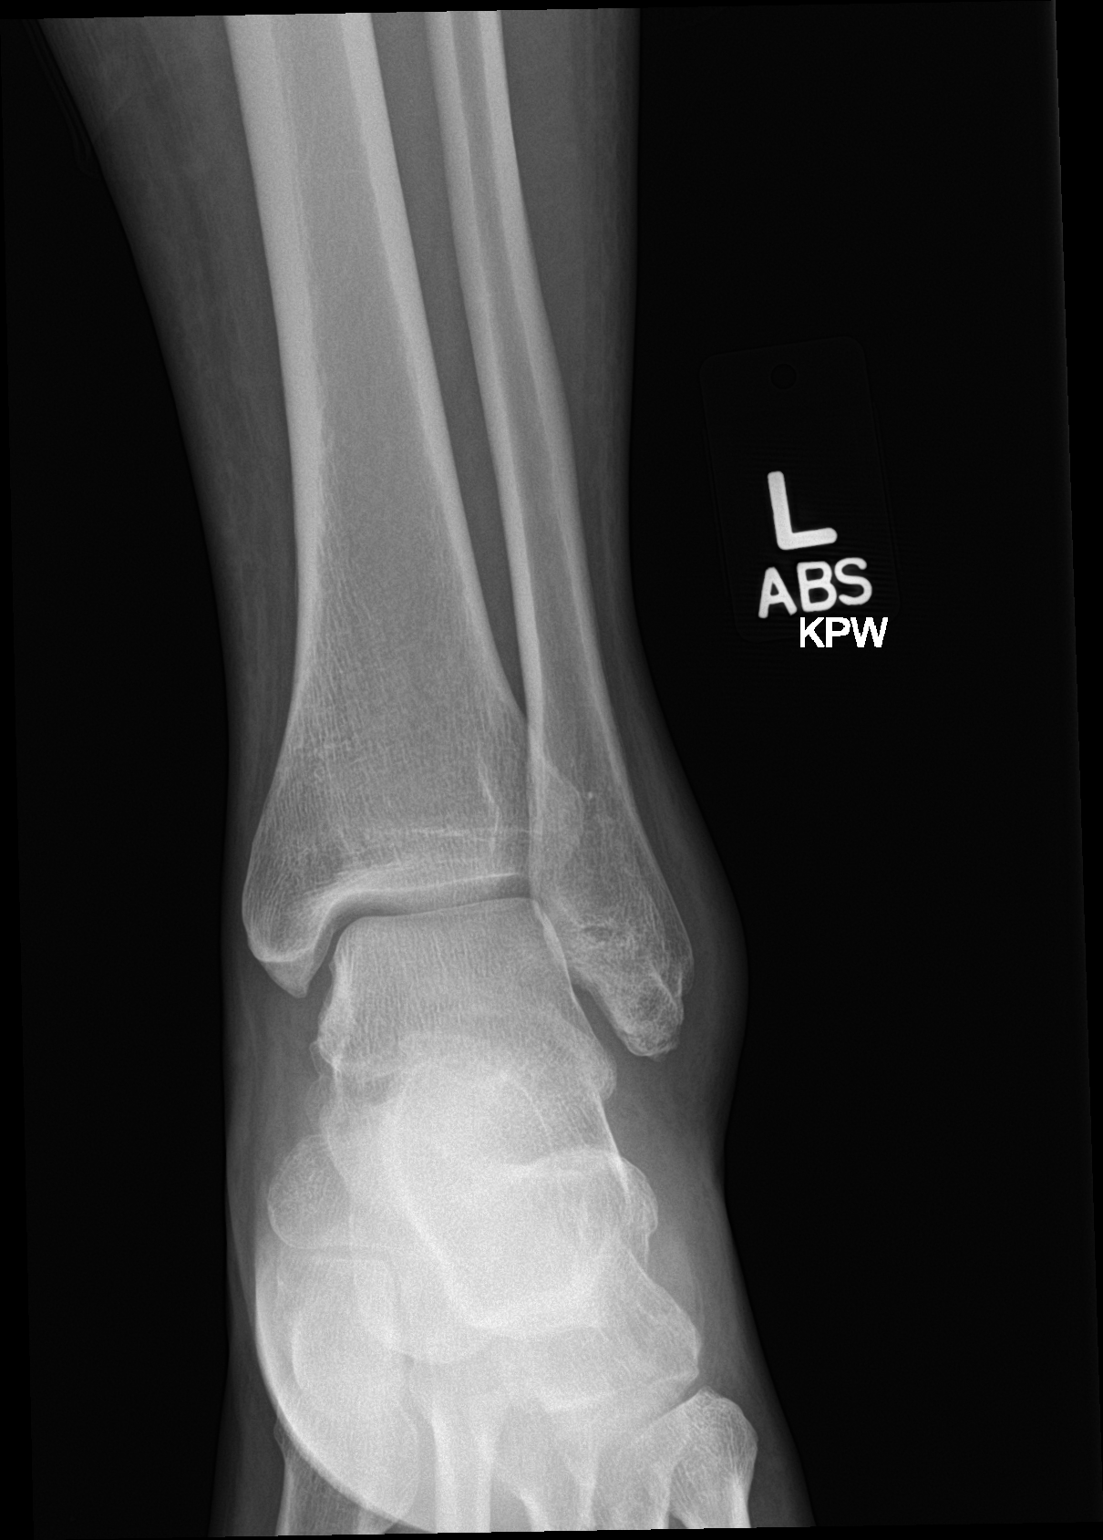

[ankle obl]
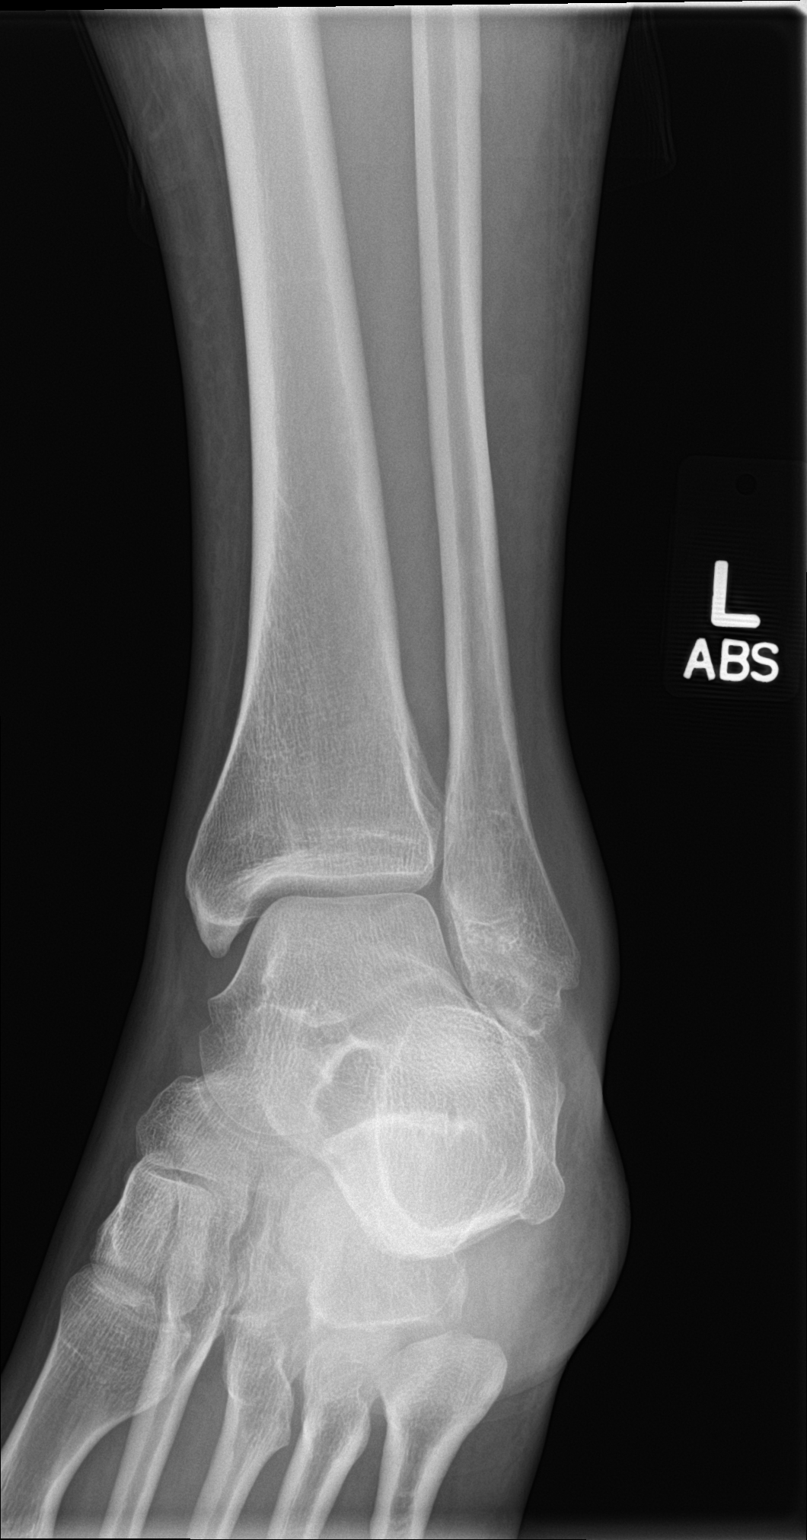

[ankle lat]
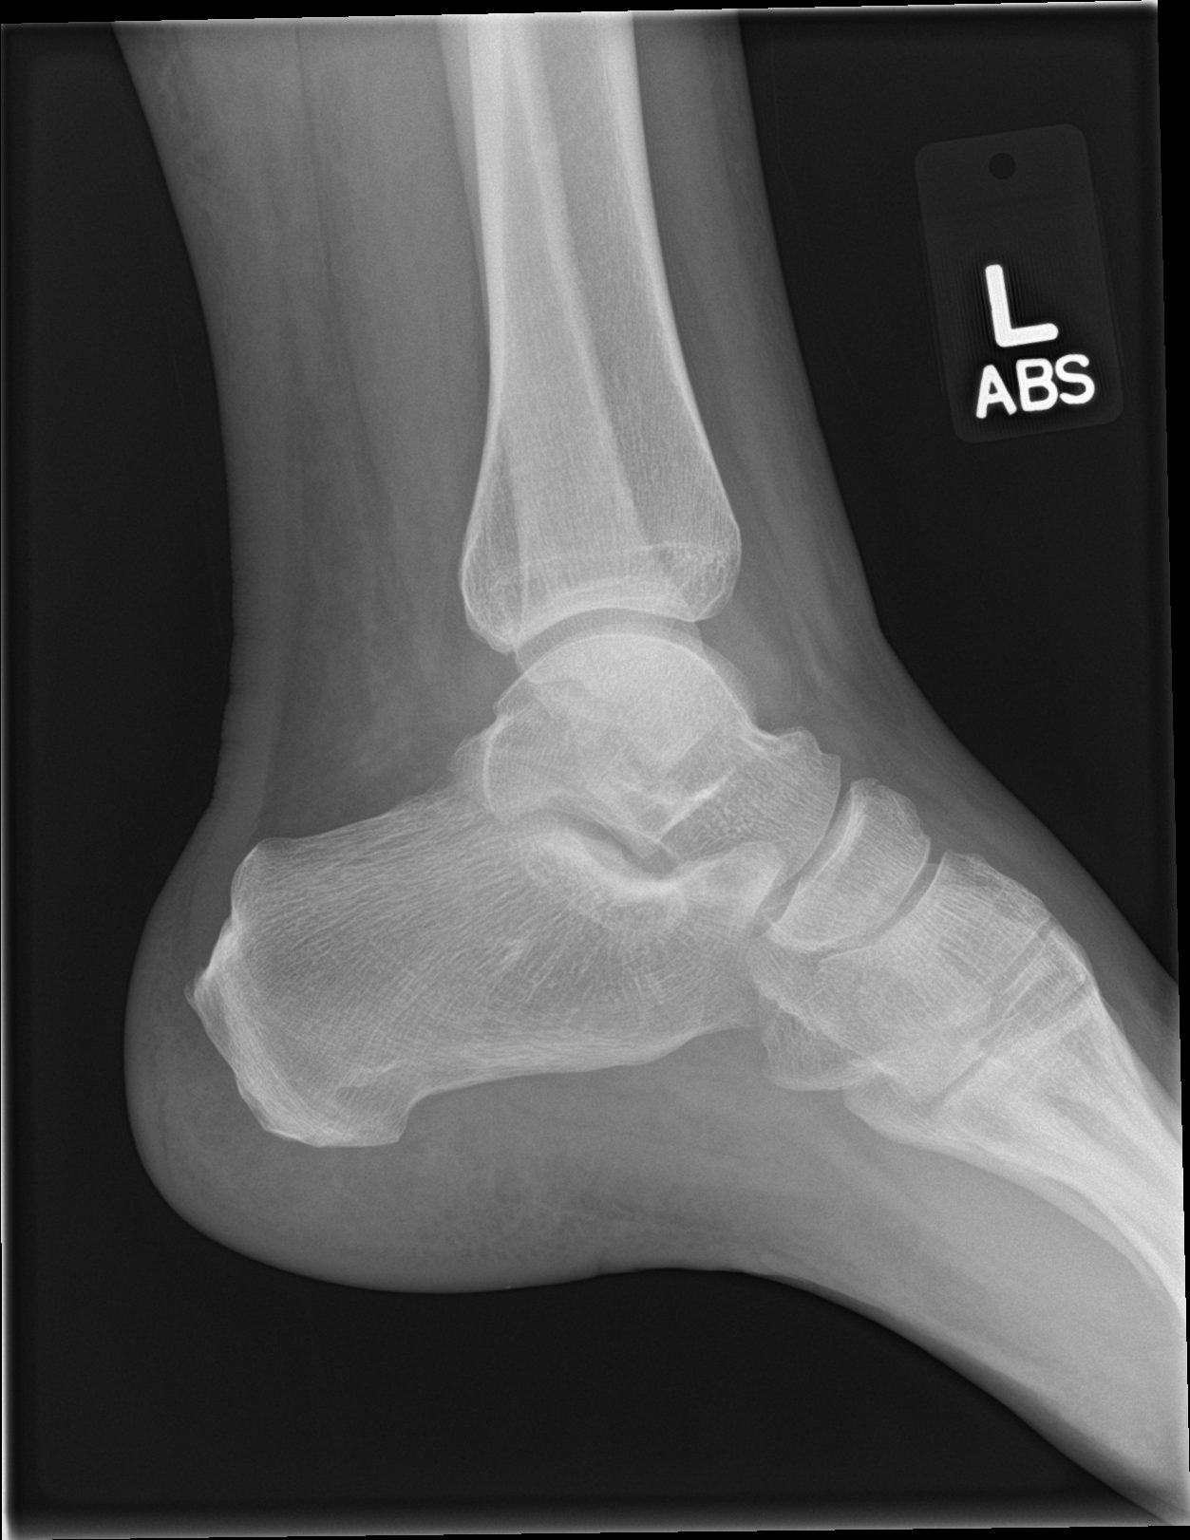

[3 of 3 positions shown; findings below may reference images not displayed]

FINDINGS: Lateral soft tissue swelling. Probable ankle joint effusion. A small
nondisplaced avulsion fracture is seen at the tip of the lateral
malleolus. Probable additional nondisplaced fracture of the dorsal
navicular.
IMPRESSION: 1. Nondisplaced avulsion fracture at the lateral malleolus.
2. Probable small avulsion fracture at the dorsal navicular.
3. Soft tissue swelling and ankle joint effusion.

## 2022-06-19 ENCOUNTER — Other Ambulatory Visit: Payer: Self-pay | Admitting: Internal Medicine

## 2022-06-19 DIAGNOSIS — Z79899 Other long term (current) drug therapy: Secondary | ICD-10-CM

## 2022-06-19 NOTE — Telephone Encounter (Signed)
Ok to send

## 2022-06-20 ENCOUNTER — Ambulatory Visit: Payer: Medicaid Other | Admitting: Internal Medicine

## 2022-07-03 ENCOUNTER — Encounter: Payer: Self-pay | Admitting: Physician Assistant

## 2022-07-03 ENCOUNTER — Ambulatory Visit: Payer: Medicaid Other | Admitting: Physician Assistant

## 2022-07-03 VITALS — BP 122/82 | HR 80 | Temp 98.4°F | Resp 16 | Ht 68.0 in | Wt 198.4 lb

## 2022-07-03 DIAGNOSIS — F1990 Other psychoactive substance use, unspecified, uncomplicated: Secondary | ICD-10-CM | POA: Diagnosis not present

## 2022-07-03 DIAGNOSIS — E538 Deficiency of other specified B group vitamins: Secondary | ICD-10-CM | POA: Diagnosis not present

## 2022-07-03 DIAGNOSIS — F17219 Nicotine dependence, cigarettes, with unspecified nicotine-induced disorders: Secondary | ICD-10-CM | POA: Diagnosis not present

## 2022-07-03 DIAGNOSIS — Z114 Encounter for screening for human immunodeficiency virus [HIV]: Secondary | ICD-10-CM

## 2022-07-03 DIAGNOSIS — E782 Mixed hyperlipidemia: Secondary | ICD-10-CM

## 2022-07-03 DIAGNOSIS — Z79890 Hormone replacement therapy (postmenopausal): Secondary | ICD-10-CM | POA: Diagnosis not present

## 2022-07-03 DIAGNOSIS — Z21 Asymptomatic human immunodeficiency virus [HIV] infection status: Secondary | ICD-10-CM

## 2022-07-03 DIAGNOSIS — R946 Abnormal results of thyroid function studies: Secondary | ICD-10-CM

## 2022-07-03 DIAGNOSIS — J4489 Other specified chronic obstructive pulmonary disease: Secondary | ICD-10-CM | POA: Diagnosis not present

## 2022-07-03 MED ORDER — CYANOCOBALAMIN 1000 MCG/ML IJ SOLN
1000.0000 ug | Freq: Once | INTRAMUSCULAR | Status: AC
  Administered 2022-07-03: 1000 ug via INTRAMUSCULAR

## 2022-07-03 NOTE — Progress Notes (Signed)
[facility] [address] [place], [place] [address]  Internal MEDICINE  Office Visit Note  Patient Name: Clifford Burgess  [id number]  [id number]  Date of Service: 07/12/2022  Chief Complaint  Patient presents with   Follow-up   Gastroesophageal Reflux    HPI Pt is here for routine follow up -Stopped wellbutrin because was causing desire to smoke more -smoking about 1/2-1ppd -No protection for recent sexual intercourse, would like to check for HIV with upcoming labs. Did discuss option of seeing ID for prevention medications -Low B12 previously and will start injections -Would like to have adam's apple procedure. Would need letter from pulmonary to clear this and will discuss with Dr. Welton Flakes at visit -quart of moonshine, from break up the other day. Denies other substance use recently. Had previously discussed seeing psych regarding addiction counseling/detox if needed. She is more open to this now that new center is opening up  Current Medication: Outpatient Encounter Medications as of 07/03/2022  Medication Sig   albuterol (VENTOLIN HFA) 108 (90 Base) MCG/ACT inhaler Inhale 2 puffs into the lungs every 6 (six) hours as needed for wheezing or shortness of breath.   aspirin EC 81 MG tablet Take 81 mg by mouth daily.   dicyclomine (BENTYL) 20 MG tablet Take 1 tablet (20 mg total) by mouth every 6 (six) hours.   estradiol (ESTRACE) 1 MG tablet Take 1 tablet (1 mg total) by mouth daily.   HYDROcodone-acetaminophen (NORCO/VICODIN) 5-325 MG tablet Take 1 tablet by mouth every 6 (six) hours as needed.   ipratropium-albuterol (DUONEB) 0.5-2.5 (3) MG/3ML SOLN Take 3 mLs by nebulization every 6 (six) hours as needed.   lactulose (CHRONULAC) 10 GM/15ML solution Take 30 mLs (20 g total) by mouth daily as needed for mild constipation.   mometasone-formoterol (DULERA) 100-5 MCG/ACT AERO Inhale 2 puffs into the lungs 2 (two) times daily.   omeprazole (PRILOSEC) 20 MG capsule TAKE 1  CAPSULE BY MOUTH EVERY DAY   ondansetron (ZOFRAN-ODT) 4 MG disintegrating tablet Take 1 tablet (4 mg total) by mouth every 8 (eight) hours as needed for nausea or vomiting.   OXYGEN Inhale 2 L into the lungs. At night pt uses 24 hrs from [facility]   progesterone (PROMETRIUM) 100 MG capsule TAKE 1 CAPSULE BY MOUTH EVERY EVENING   rosuvastatin (CRESTOR) 10 MG tablet Take 1 tablet (10 mg total) by mouth daily.   spironolactone (ALDACTONE) 50 MG tablet Take 1 tablet (50 mg total) by mouth 2 (two) times daily.   Tiotropium Bromide Monohydrate (SPIRIVA RESPIMAT) 2.5 MCG/ACT AERS Inhale 2 puffs into the lungs daily.   [DISCONTINUED] buPROPion (WELLBUTRIN XL) 150 MG 24 hr tablet Take 1 tablet (150 mg total) by mouth daily.   Facility-Administered Encounter Medications as of 07/03/2022  Medication   [COMPLETED] cyanocobalamin (VITAMIN B12) injection 1,000 mcg   ipratropium-albuterol (DUONEB) 0.5-2.5 (3) MG/3ML nebulizer solution 3 mL    Surgical History: Past Surgical History:  Procedure Laterality Date   HERNIA REPAIR      Medical History: Past Medical History:  Diagnosis Date   Asthma    COPD (chronic obstructive pulmonary disease) (HCC)    GERD (gastroesophageal reflux disease)     Family History: Family History  Problem Relation Age of Onset   Hypertension Father     Social History   Socioeconomic History   Marital status: Divorced    Spouse name: Not on file   Number of children: 2   Years of education: Not  on file   Highest education level: 8th grade  Occupational History   Not on file  Tobacco Use   Smoking status: Every Day    Packs/day: 1.00    Years: 36.00    Additional pack years: 0.00    Total pack years: 36.00    Types: Cigarettes   Smokeless tobacco: Former    Types: Chew   Tobacco comments:    1/2 - 1 pack a day.  Started smoking age 69.  Vaping Use   Vaping Use: Never used  Substance and Sexual Activity   Alcohol use: Yes    Alcohol/week: 2.0  standard drinks of alcohol    Types: 2 Cans of beer per week    Comment: occasional   Drug use: Yes    Types: Cocaine, Marijuana, Heroin, LSD, "Crack" cocaine    Comment: 11/10/21  Pt reports drug use "last week"   Sexual activity: Not Currently  Other Topics Concern   Not on file  Social History Narrative   Not on file   Social Determinants of Health   Financial Resource Strain: High Risk (11/27/2017)   Overall Financial Resource Strain (CARDIA)    Difficulty of Paying Living Expenses: Hard  Food Insecurity: Food Insecurity Present (11/27/2017)   Hunger Vital Sign    Worried About Running Out of Food in the Last Year: Often true    Ran Out of Food in the Last Year: Often true  Transportation Needs: No Transportation Needs (11/27/2017)   PRAPARE - Administrator, Civil Service (Medical): No    Lack of Transportation (Non-Medical): No  Physical Activity: Inactive (11/27/2017)   Exercise Vital Sign    Days of Exercise per Week: 0 days    Minutes of Exercise per Session: 0 min  Stress: No Stress Concern Present (11/27/2017)   [employer] of Occupational Health - Occupational Stress Questionnaire    Feeling of Stress : Not at all  Social Connections: Unknown (11/27/2017)   Social Connection and Isolation Panel [NHANES]    Frequency of Communication with Friends and Family: Not on file    Frequency of Social Gatherings with Friends and Family: Not on file    Attends Religious Services: Never    Active Member of Clubs or Organizations: No    Attends Banker Meetings: Never    Marital Status: Divorced  Catering manager Violence: Unknown (11/27/2017)   Humiliation, Afraid, Rape, and Kick questionnaire    Fear of Current or Ex-Partner: Not on file    Emotionally Abused: No    Physically Abused: No    Sexually Abused: No      Review of Systems  Constitutional:  Negative for chills, fatigue and unexpected weight change.  HENT:  Positive for  postnasal drip. Negative for congestion, rhinorrhea, sneezing and sore throat.   Eyes:  Negative for redness.  Respiratory:  Negative for cough and chest tightness.   Cardiovascular:  Negative for chest pain and palpitations.  Gastrointestinal:  Negative for abdominal pain, constipation, diarrhea, nausea and vomiting.  Genitourinary:  Negative for dysuria and frequency.  Musculoskeletal:  Negative for arthralgias, back pain, joint swelling and neck pain.  Skin:  Negative for rash.  Neurological: Negative.  Negative for tremors and numbness.  Hematological:  Negative for adenopathy. Does not bruise/bleed easily.  Psychiatric/Behavioral:  Negative for behavioral problems (Depression), sleep disturbance and suicidal ideas. The patient is not nervous/anxious.     Vital Signs: BP 122/82   Pulse 80  Temp 98.4 F (36.9 C)   Resp 16   Ht 5\' 8"  (1.727 m)   Wt 198 lb 6.4 oz (90 kg)   SpO2 99%   BMI 30.17 kg/m    Physical Exam Vitals and nursing note reviewed.  Constitutional:      Appearance: Normal appearance.  HENT:     Head: Normocephalic and atraumatic.     Nose: Nose normal.     Mouth/Throat:     Mouth: Mucous membranes are moist.     Pharynx: No posterior oropharyngeal erythema.  Eyes:     Extraocular Movements: Extraocular movements intact.     Pupils: Pupils are equal, round, and reactive to light.  Cardiovascular:     Rate and Rhythm: Normal rate and regular rhythm.     Pulses: Normal pulses.     Heart sounds: Normal heart sounds.  Pulmonary:     Effort: Pulmonary effort is normal.  Abdominal:     General: Abdomen is flat.     Palpations: Abdomen is soft.  Musculoskeletal:        General: Normal range of motion.  Skin:    General: Skin is warm and dry.  Neurological:     General: No focal deficit present.     Mental Status: She is alert.  Psychiatric:        Mood and Affect: Mood normal.        Behavior: Behavior normal.        Assessment/Plan: 1.  Obstructive chronic bronchitis without exacerbation Followed by pulmonology  2. Cigarette nicotine dependence with nicotine-induced disorder Unable to continue wellbutrin due to increased cravings, continue to work on cessation  3. Polysubstance use disorder - Ambulatory referral to Psychiatry  4. Hormone replacement therapy - Estrogens, total - FSH/LH - Testosterone,Free and Total  5. Screening for HIV (human immunodeficiency virus) - HIV antibody (with reflex)  6. Abnormal thyroid exam - TSH + free T4  7. Mixed hyperlipidemia - Lipid Panel With LDL/HDL Ratio  8. B12 deficiency - cyanocobalamin (VITAMIN B12) injection 1,000 mcg   General Counseling: Lytle Michaels understanding of the findings of todays visit and agrees with plan of treatment. I have discussed any further diagnostic evaluation that may be needed or ordered today. We also reviewed her medications today. she has been encouraged to call the office with any questions or concerns that should arise related to todays visit.    Orders Placed This Encounter  Procedures   HIV antibody (with reflex)   Estrogens, total   FSH/LH   Testosterone,Free and Total   Lipid Panel With LDL/HDL Ratio   TSH + free T4   Ambulatory referral to Psychiatry    Meds ordered this encounter  Medications   cyanocobalamin (VITAMIN B12) injection 1,000 mcg    This patient was seen by Lynn Ito, PA-C in collaboration with Dr. Beverely Risen as a part of collaborative care agreement.   Total time spent:30 Minutes Time spent includes review of chart, medications, test results, and follow up plan with the patient.      Dr Lyndon Code Internal medicine

## 2022-07-04 ENCOUNTER — Telehealth: Payer: Self-pay | Admitting: Physician Assistant

## 2022-07-04 NOTE — Telephone Encounter (Signed)
S/w patient. Gave her [facility] information. She will call me back when she starts going-Toni

## 2022-07-05 ENCOUNTER — Other Ambulatory Visit
Admission: RE | Admit: 2022-07-05 | Discharge: 2022-07-05 | Disposition: A | Discharge status: Home / Self Care | Payer: Medicaid Other | Attending: Physician Assistant | Admitting: Physician Assistant

## 2022-07-05 DIAGNOSIS — E782 Mixed hyperlipidemia: Secondary | ICD-10-CM | POA: Insufficient documentation

## 2022-07-05 DIAGNOSIS — Z21 Asymptomatic human immunodeficiency virus [HIV] infection status: Secondary | ICD-10-CM | POA: Insufficient documentation

## 2022-07-05 DIAGNOSIS — R946 Abnormal results of thyroid function studies: Secondary | ICD-10-CM | POA: Diagnosis not present

## 2022-07-05 DIAGNOSIS — Z79890 Hormone replacement therapy (postmenopausal): Secondary | ICD-10-CM | POA: Insufficient documentation

## 2022-07-05 LAB — LIPID PANEL
Cholesterol: 161 mg/dL (ref 0–200)
HDL: 63 mg/dL (ref >40)
LDL Cholesterol: 85 mg/dL (ref 0–99)
Total CHOL/HDL Ratio: 2.6 RATIO
Triglycerides: 65 mg/dL (ref <150)
VLDL: 13 mg/dL (ref 0–40)

## 2022-07-05 LAB — T4, FREE: Free T4: 0.72 ng/dL (ref 0.61–1.12)

## 2022-07-05 LAB — TSH: TSH: 2.164 u[IU]/mL (ref 0.350–4.500)

## 2022-07-05 LAB — HIV ANTIBODY (ROUTINE TESTING W REFLEX): HIV Screen 4th Generation wRfx: NONREACTIVE

## 2022-07-06 LAB — FSH/LH
FSH: 9.7 m[IU]/mL (ref 1.5–12.4)
LH: 29 m[IU]/mL — ABNORMAL HIGH (ref 1.7–8.6)

## 2022-07-07 LAB — TESTOSTERONE,FREE AND TOTAL
Testosterone, Free: 11.2 pg/mL (ref 7.2–24.0)
Testosterone: 677 ng/dL (ref 264–916)

## 2022-07-08 LAB — ESTROGENS, TOTAL: Estrogen: 212 pg/mL (ref 56–213)

## 2022-07-10 ENCOUNTER — Ambulatory Visit (INDEPENDENT_AMBULATORY_CARE_PROVIDER_SITE_OTHER): Payer: 59

## 2022-07-10 DIAGNOSIS — E538 Deficiency of other specified B group vitamins: Secondary | ICD-10-CM | POA: Diagnosis not present

## 2022-07-10 MED ORDER — CYANOCOBALAMIN 1000 MCG/ML IJ SOLN
1000.0000 ug | Freq: Once | INTRAMUSCULAR | Status: AC
Start: 2022-07-10 — End: 2022-07-10
  Administered 2022-07-10: 1000 ug via INTRAMUSCULAR

## 2022-07-12 DIAGNOSIS — Z113 Encounter for screening for infections with a predominantly sexual mode of transmission: Secondary | ICD-10-CM | POA: Diagnosis not present

## 2022-07-17 ENCOUNTER — Ambulatory Visit (INDEPENDENT_AMBULATORY_CARE_PROVIDER_SITE_OTHER): Payer: 59

## 2022-07-17 DIAGNOSIS — E538 Deficiency of other specified B group vitamins: Secondary | ICD-10-CM

## 2022-07-17 MED ORDER — CYANOCOBALAMIN 1000 MCG/ML IJ SOLN
1000.00 ug | Freq: Once | INTRAMUSCULAR | Status: AC
Start: 2022-07-17 — End: 2022-07-17
  Administered 2022-07-17: 1000 ug via INTRAMUSCULAR

## 2022-07-20 DIAGNOSIS — J449 Chronic obstructive pulmonary disease, unspecified: Secondary | ICD-10-CM | POA: Diagnosis not present

## 2022-07-26 ENCOUNTER — Other Ambulatory Visit: Payer: Self-pay | Admitting: Internal Medicine

## 2022-07-26 DIAGNOSIS — K219 Gastro-esophageal reflux disease without esophagitis: Secondary | ICD-10-CM

## 2022-08-14 ENCOUNTER — Ambulatory Visit (INDEPENDENT_AMBULATORY_CARE_PROVIDER_SITE_OTHER): Payer: 59

## 2022-08-14 DIAGNOSIS — E538 Deficiency of other specified B group vitamins: Secondary | ICD-10-CM

## 2022-08-14 MED ORDER — CYANOCOBALAMIN 1000 MCG/ML IJ SOLN
1000.0000 ug | Freq: Once | INTRAMUSCULAR | Status: AC
Start: 2022-08-14 — End: 2022-08-14
  Administered 2022-08-14: 1000 ug via INTRAMUSCULAR

## 2022-09-11 ENCOUNTER — Other Ambulatory Visit: Payer: Self-pay | Admitting: Physician Assistant

## 2022-09-11 DIAGNOSIS — F17219 Nicotine dependence, cigarettes, with unspecified nicotine-induced disorders: Secondary | ICD-10-CM

## 2022-09-12 ENCOUNTER — Ambulatory Visit (INDEPENDENT_AMBULATORY_CARE_PROVIDER_SITE_OTHER): Payer: 59

## 2022-09-12 DIAGNOSIS — E538 Deficiency of other specified B group vitamins: Secondary | ICD-10-CM | POA: Diagnosis not present

## 2022-09-12 MED ORDER — CYANOCOBALAMIN 1000 MCG/ML IJ SOLN
1000.0000 ug | Freq: Once | INTRAMUSCULAR | Status: AC
Start: 2022-09-12 — End: 2022-09-12
  Administered 2022-09-12: 1000 ug via INTRAMUSCULAR

## 2022-09-28 ENCOUNTER — Encounter: Payer: Self-pay | Admitting: Physician Assistant

## 2022-09-28 ENCOUNTER — Ambulatory Visit (INDEPENDENT_AMBULATORY_CARE_PROVIDER_SITE_OTHER): Payer: 59 | Admitting: Physician Assistant

## 2022-09-28 VITALS — BP 115/70 | HR 73 | Temp 98.0°F | Resp 16 | Ht 68.0 in | Wt 192.0 lb

## 2022-09-28 DIAGNOSIS — E538 Deficiency of other specified B group vitamins: Secondary | ICD-10-CM | POA: Diagnosis not present

## 2022-09-28 DIAGNOSIS — J4489 Other specified chronic obstructive pulmonary disease: Secondary | ICD-10-CM | POA: Diagnosis not present

## 2022-09-28 DIAGNOSIS — Z79890 Hormone replacement therapy (postmenopausal): Secondary | ICD-10-CM

## 2022-09-28 NOTE — Progress Notes (Signed)
[facility] [address] [place], [place] [address]  Internal MEDICINE  Office Visit Note  Patient Name: Clifford Burgess  [id number]  [id number]  Date of Service: 09/28/2022  Chief Complaint  Patient presents with   Follow-up   Gastroesophageal Reflux    HPI Pt is here for routine follow up -[facility] and is now on an injection for this, Labs for monitoring will be done through them as well and therefore will not be receiving any hormone refills from [place] -Has surgery consult in Dec at [facility], has been on hormones since Dec 2020.  -still smoking 1ppd, breathing is about the same. Spiriva is working well -Doing B12 shots  Current Medication: Outpatient Encounter Medications as of 09/28/2022  Medication Sig   albuterol (VENTOLIN HFA) 108 (90 Base) MCG/ACT inhaler Inhale 2 puffs into the lungs every 6 (six) hours as needed for wheezing or shortness of breath.   aspirin EC 81 MG tablet Take 81 mg by mouth daily.   dicyclomine (BENTYL) 20 MG tablet Take 1 tablet (20 mg total) by mouth every 6 (six) hours.   estradiol valerate (DELESTROGEN) 40 MG/ML injection Inject 40 mg into the muscle every 28 (twenty-eight) days. Patient gets this medication from [facility].   ipratropium-albuterol (DUONEB) 0.5-2.5 (3) MG/3ML SOLN Take 3 mLs by nebulization every 6 (six) hours as needed.   lactulose (CHRONULAC) 10 GM/15ML solution Take 30 mLs (20 g total) by mouth daily as needed for mild constipation.   mometasone-formoterol (DULERA) 100-5 MCG/ACT AERO Inhale 2 puffs into the lungs 2 (two) times daily.   omeprazole (PRILOSEC) 20 MG capsule TAKE 1 CAPSULE BY MOUTH EVERY DAY   ondansetron (ZOFRAN-ODT) 4 MG disintegrating tablet Take 1 tablet (4 mg total) by mouth every 8 (eight) hours as needed for nausea or vomiting.   OXYGEN Inhale 2 L into the lungs. At night pt uses 24 hrs from [facility]   rosuvastatin (CRESTOR) 10 MG tablet Take 1  tablet (10 mg total) by mouth daily.   spironolactone (ALDACTONE) 50 MG tablet Take 1 tablet (50 mg total) by mouth 2 (two) times daily.   Tiotropium Bromide Monohydrate (SPIRIVA RESPIMAT) 2.5 MCG/ACT AERS Inhale 2 puffs into the lungs daily.   [DISCONTINUED] estradiol (ESTRACE) 1 MG tablet Take 1 tablet by mouth daily.   [DISCONTINUED] estradiol (ESTRACE) 1 MG tablet Take 1 tablet (1 mg total) by mouth daily. (Patient not taking: Reported on 09/28/2022)   [DISCONTINUED] HYDROcodone-acetaminophen (NORCO/VICODIN) 5-325 MG tablet Take 1 tablet by mouth every 6 (six) hours as needed.   [DISCONTINUED] progesterone (PROMETRIUM) 100 MG capsule TAKE 1 CAPSULE BY MOUTH EVERY EVENING (Patient not taking: Reported on 09/28/2022)   Facility-Administered Encounter Medications as of 09/28/2022  Medication   ipratropium-albuterol (DUONEB) 0.5-2.5 (3) MG/3ML nebulizer solution 3 mL    Surgical History: Past Surgical History:  Procedure Laterality Date   HERNIA REPAIR      Medical History: Past Medical History:  Diagnosis Date   Asthma    COPD (chronic obstructive pulmonary disease) (HCC)    GERD (gastroesophageal reflux disease)     Family History: Family History  Problem Relation Age of Onset   Hypertension Father     Social History   Socioeconomic History   Marital status: Divorced    Spouse name: Not on file   Number of children: 2   Years of education: Not on file   Highest education level: 8th grade  Occupational History  Not on file  Tobacco Use   Smoking status: Every Day    Current packs/day: 1.00    Average packs/day: 1 pack/day for 36.0 years (36.0 ttl pk-yrs)    Types: Cigarettes   Smokeless tobacco: Former    Types: Chew   Tobacco comments:    1/2 - 1 pack a day.  Started smoking age 26.  Vaping Use   Vaping status: Never Used  Substance and Sexual Activity   Alcohol use: Yes    Alcohol/week: 2.0 standard drinks of alcohol    Types: 2 Cans of beer per week     Comment: occasional   Drug use: Yes    Types: Cocaine, Marijuana, Heroin, LSD, "Crack" cocaine    Comment: 11/10/21  Pt reports drug use "last week"   Sexual activity: Not Currently  Other Topics Concern   Not on file  Social History Narrative   Not on file   Social Determinants of Health   Financial Resource Strain: High Risk (11/27/2017)   Overall Financial Resource Strain (CARDIA)    Difficulty of Paying Living Expenses: Hard  Food Insecurity: Food Insecurity Present (11/27/2017)   Hunger Vital Sign    Worried About Running Out of Food in the Last Year: Often true    Ran Out of Food in the Last Year: Often true  Transportation Needs: No Transportation Needs (11/27/2017)   PRAPARE - Administrator, Civil Service (Medical): No    Lack of Transportation (Non-Medical): No  Physical Activity: Inactive (11/27/2017)   Exercise Vital Sign    Days of Exercise per Week: 0 days    Minutes of Exercise per Session: 0 min  Stress: No Stress Concern Present (11/27/2017)   [employer] of Occupational Health - Occupational Stress Questionnaire    Feeling of Stress : Not at all  Social Connections: Unknown (11/27/2017)   Social Connection and Isolation Panel [NHANES]    Frequency of Communication with Friends and Family: Not on file    Frequency of Social Gatherings with Friends and Family: Not on file    Attends Religious Services: Never    Active Member of Clubs or Organizations: No    Attends Banker Meetings: Never    Marital Status: Divorced  Catering manager Violence: Unknown (11/27/2017)   Humiliation, Afraid, Rape, and Kick questionnaire    Fear of Current or Ex-Partner: Not on file    Emotionally Abused: No    Physically Abused: No    Sexually Abused: No      Review of Systems  Constitutional:  Negative for chills, fatigue and unexpected weight change.  HENT:  Positive for postnasal drip. Negative for congestion, rhinorrhea, sneezing and sore  throat.   Eyes:  Negative for redness.  Respiratory:  Negative for cough and chest tightness.   Cardiovascular:  Negative for chest pain and palpitations.  Gastrointestinal:  Negative for abdominal pain, constipation, diarrhea, nausea and vomiting.  Genitourinary:  Negative for dysuria and frequency.  Musculoskeletal:  Negative for arthralgias, back pain, joint swelling and neck pain.  Skin:  Negative for rash.  Neurological: Negative.  Negative for tremors and numbness.  Hematological:  Negative for adenopathy. Does not bruise/bleed easily.  Psychiatric/Behavioral:  Negative for behavioral problems (Depression), sleep disturbance and suicidal ideas. The patient is not nervous/anxious.     Vital Signs: BP 115/70   Pulse 73   Temp 98 F (36.7 C)   Resp 16   Ht 5\' 8"  (1.727 m)   Wt  192 lb (87.1 kg)   SpO2 97%   BMI 29.19 kg/m    Physical Exam Vitals and nursing note reviewed.  Constitutional:      Appearance: Normal appearance.  HENT:     Head: Normocephalic and atraumatic.     Nose: Nose normal.     Mouth/Throat:     Mouth: Mucous membranes are moist.     Pharynx: No posterior oropharyngeal erythema.  Eyes:     Extraocular Movements: Extraocular movements intact.     Pupils: Pupils are equal, round, and reactive to light.  Cardiovascular:     Rate and Rhythm: Normal rate and regular rhythm.     Pulses: Normal pulses.     Heart sounds: Normal heart sounds.  Pulmonary:     Effort: Pulmonary effort is normal.  Abdominal:     General: Abdomen is flat.     Palpations: Abdomen is soft.  Musculoskeletal:        General: Normal range of motion.  Skin:    General: Skin is warm and dry.  Neurological:     General: No focal deficit present.     Mental Status: She is alert.  Psychiatric:        Mood and Affect: Mood normal.        Behavior: Behavior normal.        Assessment/Plan: 1. [facility]  2. Obstructive  chronic bronchitis without exacerbation Continue current inhalers, followed by pulmonology  3. B12 deficiency Continue B12   General Counseling: torry grady understanding of the findings of todays visit and agrees with plan of treatment. I have discussed any further diagnostic evaluation that may be needed or ordered today. We also reviewed her medications today. she has been encouraged to call the office with any questions or concerns that should arise related to todays visit.    No orders of the defined types were placed in this encounter.   No orders of the defined types were placed in this encounter.   This patient was seen by Lynn Ito, PA-C in collaboration with Dr. Beverely Risen as a part of collaborative care agreement.   Total time spent:30 Minutes Time spent includes review of chart, medications, test results, and follow up plan with the patient.      Dr Lyndon Code Internal medicine

## 2022-10-02 ENCOUNTER — Ambulatory Visit: Payer: 59 | Admitting: Physician Assistant

## 2022-10-11 DIAGNOSIS — Z131 Encounter for screening for diabetes mellitus: Secondary | ICD-10-CM | POA: Diagnosis not present

## 2022-10-11 DIAGNOSIS — F649 Gender identity disorder, unspecified: Secondary | ICD-10-CM | POA: Diagnosis not present

## 2022-10-13 DIAGNOSIS — F649 Gender identity disorder, unspecified: Secondary | ICD-10-CM | POA: Diagnosis not present

## 2022-10-13 DIAGNOSIS — Z131 Encounter for screening for diabetes mellitus: Secondary | ICD-10-CM | POA: Diagnosis not present

## 2022-10-16 ENCOUNTER — Ambulatory Visit (INDEPENDENT_AMBULATORY_CARE_PROVIDER_SITE_OTHER): Payer: 59

## 2022-10-16 DIAGNOSIS — E538 Deficiency of other specified B group vitamins: Secondary | ICD-10-CM

## 2022-10-16 MED ORDER — CYANOCOBALAMIN 1000 MCG/ML IJ SOLN
1000.0000 ug | Freq: Once | INTRAMUSCULAR | Status: AC
Start: 2022-10-16 — End: 2022-10-16
  Administered 2022-10-16: 1000 ug via INTRAMUSCULAR

## 2022-10-26 ENCOUNTER — Other Ambulatory Visit: Payer: Self-pay | Admitting: Physician Assistant

## 2022-10-26 DIAGNOSIS — K219 Gastro-esophageal reflux disease without esophagitis: Secondary | ICD-10-CM

## 2022-11-10 ENCOUNTER — Ambulatory Visit (INDEPENDENT_AMBULATORY_CARE_PROVIDER_SITE_OTHER): Payer: 59 | Admitting: Physician Assistant

## 2022-11-10 ENCOUNTER — Encounter: Payer: Self-pay | Admitting: Physician Assistant

## 2022-11-10 VITALS — BP 110/75 | HR 64 | Temp 98.2°F | Resp 16 | Ht 68.0 in | Wt 194.6 lb

## 2022-11-10 DIAGNOSIS — F17219 Nicotine dependence, cigarettes, with unspecified nicotine-induced disorders: Secondary | ICD-10-CM

## 2022-11-10 DIAGNOSIS — Z0001 Encounter for general adult medical examination with abnormal findings: Secondary | ICD-10-CM

## 2022-11-10 DIAGNOSIS — J441 Chronic obstructive pulmonary disease with (acute) exacerbation: Secondary | ICD-10-CM

## 2022-11-10 DIAGNOSIS — J4489 Other specified chronic obstructive pulmonary disease: Secondary | ICD-10-CM | POA: Diagnosis not present

## 2022-11-10 MED ORDER — IPRATROPIUM-ALBUTEROL 0.5-2.5 (3) MG/3ML IN SOLN: 3.0000 mL | Freq: Four times a day (QID) | RESPIRATORY_TRACT | 1 refills | Status: DC | PRN

## 2022-11-10 NOTE — Progress Notes (Signed)
[facility] [address] [place], [place] [address]  Internal MEDICINE  Office Visit Note  Patient Name: Clifford Burgess  [id number]  [id number]  Date of Service: 11/20/2022  Chief Complaint  Patient presents with   Annual Exam   Gastroesophageal Reflux   Prediabetes    Patient recently had A1C checked at [facility] and it was 6.3%     HPI Pt is here for routine health maintenance examination -Cologuard due in May 2025 -Not sure if she had shingles vaccines, will think about this -Found to have elevated A1c 6.3, doesn't eat much sweets, but does love bread and will work on carbs -[facility] to 200mg  progesterone and is taking injection of estrogen at higher dose now as well  -[facility]th [facility], BMP looked good except sugar a little elevated at 102. A1c 6.3  Current Medication: Outpatient Encounter Medications as of 11/10/2022  Medication Sig   albuterol (VENTOLIN HFA) 108 (90 Base) MCG/ACT inhaler Inhale 2 puffs into the lungs every 6 (six) hours as needed for wheezing or shortness of breath.   aspirin EC 81 MG tablet Take 81 mg by mouth daily.   dicyclomine (BENTYL) 20 MG tablet Take 1 tablet (20 mg total) by mouth every 6 (six) hours.   estradiol valerate (DELESTROGEN) 40 MG/ML injection Inject 40 mg into the muscle every 28 (twenty-eight) days. Patient gets this medication from [facility].   lactulose (CHRONULAC) 10 GM/15ML solution Take 30 mLs (20 g total) by mouth daily as needed for mild constipation.   mometasone-formoterol (DULERA) 100-5 MCG/ACT AERO Inhale 2 puffs into the lungs 2 (two) times daily.   omeprazole (PRILOSEC) 20 MG capsule TAKE 1 CAPSULE BY MOUTH EVERY DAY   ondansetron (ZOFRAN-ODT) 4 MG disintegrating tablet Take 1 tablet (4 mg total) by mouth every 8 (eight) hours as needed for nausea or vomiting.   OXYGEN Inhale 2 L into the lungs. At night pt uses 24 hrs from [facility]  Pt   progesterone (PROMETRIUM) 200 MG capsule SMARTSIG:200 Milligram(s) By Mouth Every Evening   rosuvastatin (CRESTOR) 10 MG tablet Take 1 tablet (10 mg total) by mouth daily.   spironolactone (ALDACTONE) 50 MG tablet Take 1 tablet (50 mg total) by mouth 2 (two) times daily.   Tiotropium Bromide Monohydrate (SPIRIVA RESPIMAT) 2.5 MCG/ACT AERS Inhale 2 puffs into the lungs daily.   [DISCONTINUED] ipratropium-albuterol (DUONEB) 0.5-2.5 (3) MG/3ML SOLN Take 3 mLs by nebulization every 6 (six) hours as needed.   ipratropium-albuterol (DUONEB) 0.5-2.5 (3) MG/3ML SOLN Take 3 mLs by nebulization every 6 (six) hours as needed.   Facility-Administered Encounter Medications as of 11/10/2022  Medication   ipratropium-albuterol (DUONEB) 0.5-2.5 (3) MG/3ML nebulizer solution 3 mL    Surgical History: Past Surgical History:  Procedure Laterality Date   HERNIA REPAIR      Medical History: Past Medical History:  Diagnosis Date   Asthma    COPD (chronic obstructive pulmonary disease) (HCC)    GERD (gastroesophageal reflux disease)     Family History: Family History  Problem Relation Age of Onset   Hypertension Father       Review of Systems  Constitutional:  Negative for chills, fatigue and unexpected weight change.  HENT:  Positive for postnasal drip. Negative for congestion, rhinorrhea, sneezing and sore throat.   Eyes:  Negative for redness.  Respiratory:  Negative for cough and chest tightness.   Cardiovascular:  Negative for chest pain and palpitations.  Gastrointestinal:  Negative for abdominal pain, constipation, diarrhea, nausea and vomiting.  Genitourinary:  Negative for dysuria and frequency.  Musculoskeletal:  Negative for arthralgias, back pain, joint swelling and neck pain.  Skin:  Negative for rash.  Neurological: Negative.  Negative for tremors and numbness.  Hematological:  Negative for adenopathy. Does not bruise/bleed easily.  Psychiatric/Behavioral:  Negative for  behavioral problems (Depression), sleep disturbance and suicidal ideas. The patient is not nervous/anxious.      Vital Signs: BP 110/75   Pulse 64   Temp 98.2 F (36.8 C)   Resp 16   Ht 5\' 8"  (1.727 m)   Wt 194 lb 9.6 oz (88.3 kg)   SpO2 94%   BMI 29.59 kg/m    Physical Exam Vitals and nursing note reviewed.  Constitutional:      Appearance: Normal appearance.  HENT:     Head: Normocephalic and atraumatic.     Nose: Nose normal.     Mouth/Throat:     Mouth: Mucous membranes are moist.     Pharynx: No posterior oropharyngeal erythema.  Eyes:     Extraocular Movements: Extraocular movements intact.     Pupils: Pupils are equal, round, and reactive to light.  Cardiovascular:     Rate and Rhythm: Normal rate and regular rhythm.     Pulses: Normal pulses.     Heart sounds: Normal heart sounds.  Pulmonary:     Effort: Pulmonary effort is normal.  Abdominal:     General: Abdomen is flat.     Palpations: Abdomen is soft.  Musculoskeletal:        General: Normal range of motion.  Skin:    General: Skin is warm and dry.  Neurological:     General: No focal deficit present.     Mental Status: She is alert.  Psychiatric:        Mood and Affect: Mood normal.        Behavior: Behavior normal.      LABS: No results found for this or any previous visit (from the past 2160 hour(s)).      Assessment/Plan: 1. Encounter for general adult medical examination with abnormal findings CPE performed, labs from outside provider reviewed, due for Ct lung screening  2. Cigarette nicotine dependence with nicotine-induced disorder - CT CHEST LUNG CA SCREEN LOW DOSE W/O CM; Future  3. Obstructive chronic bronchitis without exacerbation (HCC) - ipratropium-albuterol (DUONEB) 0.5-2.5 (3) MG/3ML SOLN; Take 3 mLs by nebulization every 6 (six) hours as needed.  Dispense: 360 mL; Refill: 1   General Counseling: Clifford Burgess verbalizes understanding of the findings of todays visit and  agrees with plan of treatment. I have discussed any further diagnostic evaluation that may be needed or ordered today. We also reviewed her medications today. she has been encouraged to call the office with any questions or concerns that should arise related to todays visit.    Counseling:    Orders Placed This Encounter  Procedures   CT CHEST LUNG CA SCREEN LOW DOSE W/O CM    Meds ordered this encounter  Medications   ipratropium-albuterol (DUONEB) 0.5-2.5 (3) MG/3ML SOLN    Sig: Take 3 mLs by nebulization every 6 (six) hours as needed.    Dispense:  360 mL    Refill:  1    This patient was seen by Lynn Ito, PA-C in collaboration with Dr. Beverely Risen as a part of collaborative care agreement.  Total time spent:35 Minutes  Time spent includes review of  chart, medications, test results, and follow up plan with the patient.     Lyndon Code, MD  Internal Medicine

## 2022-11-13 ENCOUNTER — Ambulatory Visit: Payer: 59

## 2022-11-17 ENCOUNTER — Ambulatory Visit (INDEPENDENT_AMBULATORY_CARE_PROVIDER_SITE_OTHER): Payer: 59

## 2022-11-17 DIAGNOSIS — E538 Deficiency of other specified B group vitamins: Secondary | ICD-10-CM

## 2022-11-17 MED ORDER — CYANOCOBALAMIN 1000 MCG/ML IJ SOLN
1000.0000 ug | Freq: Once | INTRAMUSCULAR | Status: AC
Start: 2022-11-17 — End: 2022-11-17
  Administered 2022-11-17: 1000 ug via INTRAMUSCULAR

## 2022-11-25 ENCOUNTER — Other Ambulatory Visit: Payer: Self-pay | Admitting: Physician Assistant

## 2022-11-27 NOTE — Telephone Encounter (Signed)
Please review

## 2022-12-10 ENCOUNTER — Other Ambulatory Visit: Payer: Self-pay | Admitting: Physician Assistant

## 2022-12-10 DIAGNOSIS — J4489 Other specified chronic obstructive pulmonary disease: Secondary | ICD-10-CM

## 2022-12-11 ENCOUNTER — Ambulatory Visit (INDEPENDENT_AMBULATORY_CARE_PROVIDER_SITE_OTHER): Payer: 59

## 2022-12-11 DIAGNOSIS — E538 Deficiency of other specified B group vitamins: Secondary | ICD-10-CM

## 2022-12-11 MED ORDER — CYANOCOBALAMIN 1000 MCG/ML IJ SOLN
1000.0000 ug | Freq: Once | INTRAMUSCULAR | Status: AC
Start: 2022-12-11 — End: 2022-12-11
  Administered 2022-12-11: 1000 ug via INTRAMUSCULAR

## 2022-12-12 ENCOUNTER — Other Ambulatory Visit: Payer: Self-pay | Admitting: Internal Medicine

## 2022-12-14 ENCOUNTER — Inpatient Hospital Stay: Admission: RE | Admit: 2022-12-14 | Payer: Medicaid Other | Source: Ambulatory Visit

## 2022-12-18 ENCOUNTER — Telehealth: Payer: Self-pay | Admitting: Physician Assistant

## 2022-12-18 NOTE — Telephone Encounter (Signed)
S/w, he will call [facility] back to r/s missed CT appointment-Toni

## 2022-12-19 ENCOUNTER — Ambulatory Visit (INDEPENDENT_AMBULATORY_CARE_PROVIDER_SITE_OTHER): Payer: 59 | Admitting: Nurse Practitioner

## 2022-12-19 ENCOUNTER — Encounter: Payer: Self-pay | Admitting: Nurse Practitioner

## 2022-12-19 VITALS — BP 110/76 | HR 73 | Temp 98.6°F | Resp 16 | Ht 68.0 in | Wt 192.8 lb

## 2022-12-19 DIAGNOSIS — J4489 Other specified chronic obstructive pulmonary disease: Secondary | ICD-10-CM | POA: Diagnosis not present

## 2022-12-19 DIAGNOSIS — F17219 Nicotine dependence, cigarettes, with unspecified nicotine-induced disorders: Secondary | ICD-10-CM

## 2022-12-19 DIAGNOSIS — J9611 Chronic respiratory failure with hypoxia: Secondary | ICD-10-CM

## 2022-12-19 DIAGNOSIS — Z9981 Dependence on supplemental oxygen: Secondary | ICD-10-CM | POA: Diagnosis not present

## 2022-12-19 MED ORDER — ALBUTEROL SULFATE HFA 108 (90 BASE) MCG/ACT IN AERS
2.0000 | INHALATION_SPRAY | Freq: Four times a day (QID) | RESPIRATORY_TRACT | 3 refills | Status: DC | PRN
Start: 2022-12-19 — End: 2023-04-09

## 2022-12-19 MED ORDER — MOMETASONE FURO-FORMOTEROL FUM 100-5 MCG/ACT IN AERO
2.0000 | INHALATION_SPRAY | Freq: Two times a day (BID) | RESPIRATORY_TRACT | 11 refills | Status: DC
Start: 2022-12-19 — End: 2023-08-06

## 2022-12-19 MED ORDER — SPIRIVA RESPIMAT 2.5 MCG/ACT IN AERS
2.0000 | INHALATION_SPRAY | Freq: Every day | RESPIRATORY_TRACT | 6 refills | Status: DC
Start: 2022-12-19 — End: 2023-08-06

## 2022-12-19 NOTE — Progress Notes (Signed)
[facility] [address] [place], [place] [address]  Internal MEDICINE  Office Visit Note  Patient Name: Clifford Burgess  [id number]  [id number]  Date of Service: 12/19/2022  Chief Complaint  Patient presents with   Follow-up    HPI Clifford Burgess presents for a follow-up visit for COPD and oxygen dependence.  COPD -- breathing is ok per patient . On 2 LPM continuous supplemental oxygen. Due for PFT Current smoker, ranges from 1/2 ppd to 1 ppd, not interested in quitting.  Had flu vaccine this season already.    Current Medication: Outpatient Encounter Medications as of 12/19/2022  Medication Sig   aspirin EC 81 MG tablet Take 81 mg by mouth daily.   dicyclomine (BENTYL) 20 MG tablet Take 1 tablet (20 mg total) by mouth every 6 (six) hours.   estradiol valerate (DELESTROGEN) 40 MG/ML injection Inject 40 mg into the muscle every 28 (twenty-eight) days. Patient gets this medication from [facility].   ipratropium-albuterol (DUONEB) 0.5-2.5 (3) MG/3ML SOLN INHALE 3 ML BY NEBULIZER EVERY 6 HOURS AS NEEDED   lactulose (CHRONULAC) 10 GM/15ML solution Take 30 mLs (20 g total) by mouth daily as needed for mild constipation.   omeprazole (PRILOSEC) 20 MG capsule TAKE 1 CAPSULE BY MOUTH EVERY DAY   ondansetron (ZOFRAN-ODT) 4 MG disintegrating tablet Take 1 tablet (4 mg total) by mouth every 8 (eight) hours as needed for nausea or vomiting.   OXYGEN Inhale 2 L into the lungs. At night pt uses 24 hrs from [facility] Pt   progesterone (PROMETRIUM) 200 MG capsule SMARTSIG:200 Milligram(s) By Mouth Every Evening   rosuvastatin (CRESTOR) 10 MG tablet Take 1 tablet (10 mg total) by mouth daily.   spironolactone (ALDACTONE) 50 MG tablet Take 1 tablet (50 mg total) by mouth 2 (two) times daily.   [DISCONTINUED] albuterol (VENTOLIN HFA) 108 (90 Base) MCG/ACT inhaler Inhale 2 puffs into the lungs every 6 (six) hours as needed for wheezing or shortness of breath.   [DISCONTINUED]  mometasone-formoterol (DULERA) 100-5 MCG/ACT AERO Inhale 2 puffs into the lungs 2 (two) times daily.   [DISCONTINUED] Tiotropium Bromide Monohydrate (SPIRIVA RESPIMAT) 2.5 MCG/ACT AERS Inhale 2 puffs into the lungs daily.   albuterol (VENTOLIN HFA) 108 (90 Base) MCG/ACT inhaler Inhale 2 puffs into the lungs every 6 (six) hours as needed for wheezing or shortness of breath.   mometasone-formoterol (DULERA) 100-5 MCG/ACT AERO Inhale 2 puffs into the lungs 2 (two) times daily.   Tiotropium Bromide Monohydrate (SPIRIVA RESPIMAT) 2.5 MCG/ACT AERS Inhale 2 puffs into the lungs daily.   [DISCONTINUED] ipratropium-albuterol (DUONEB) 0.5-2.5 (3) MG/3ML nebulizer solution 3 mL    No facility-administered encounter medications on file as of 12/19/2022.    Surgical History: Past Surgical History:  Procedure Laterality Date   HERNIA REPAIR      Medical History: Past Medical History:  Diagnosis Date   Asthma    COPD (chronic obstructive pulmonary disease) (HCC)    GERD (gastroesophageal reflux disease)     Family History: Family History  Problem Relation Age of Onset   Hypertension Father     Social History   Socioeconomic History   Marital status: Divorced    Spouse name: Not on file   Number of children: 2   Years of education: Not on file   Highest education level: 8th grade  Occupational History   Not on file  Tobacco Use   Smoking status: Every Day    Current packs/day: 1.00  Average packs/day: 1 pack/day for 36.0 years (36.0 ttl pk-yrs)    Types: Cigarettes   Smokeless tobacco: Former    Types: Chew   Tobacco comments:    1/2 - 1 pack a day.  Started smoking age 6.  Vaping Use   Vaping status: Never Used  Substance and Sexual Activity   Alcohol use: Yes    Alcohol/week: 2.0 standard drinks of alcohol    Types: 2 Cans of beer per week    Comment: occasional   Drug use: Yes    Types: Cocaine, Marijuana, Heroin, LSD, "Crack" cocaine    Comment: 11/10/21  Pt reports  drug use "last week"   Sexual activity: Not Currently  Other Topics Concern   Not on file  Social History Narrative   Not on file   Social Determinants of Health   Financial Resource Strain: High Risk (11/27/2017)   Overall Financial Resource Strain (CARDIA)    Difficulty of Paying Living Expenses: Hard  Food Insecurity: Food Insecurity Present (11/27/2017)   Hunger Vital Sign    Worried About Running Out of Food in the Last Year: Often true    Ran Out of Food in the Last Year: Often true  Transportation Needs: No Transportation Needs (11/27/2017)   PRAPARE - Administrator, Civil Service (Medical): No    Lack of Transportation (Non-Medical): No  Physical Activity: Inactive (11/27/2017)   Exercise Vital Sign    Days of Exercise per Week: 0 days    Minutes of Exercise per Session: 0 min  Stress: No Stress Concern Present (11/27/2017)   [employer] of Occupational Health - Occupational Stress Questionnaire    Feeling of Stress : Not at all  Social Connections: Unknown (11/27/2017)   Social Connection and Isolation Panel [NHANES]    Frequency of Communication with Friends and Family: Not on file    Frequency of Social Gatherings with Friends and Family: Not on file    Attends Religious Services: Never    Database administrator or Organizations: No    Attends Banker Meetings: Never    Marital Status: Divorced  Catering manager Violence: Unknown (11/27/2017)   Humiliation, Afraid, Rape, and Kick questionnaire    Fear of Current or Ex-Partner: Not on file    Emotionally Abused: No    Physically Abused: No    Sexually Abused: No      Review of Systems  Constitutional:  Negative for appetite change, chills, fatigue and unexpected weight change.  HENT:  Negative for congestion, rhinorrhea, sneezing and sore throat.   Respiratory:  Positive for cough (smoker's cough), chest tightness (intermittent), shortness of breath (using continuous oxygen. 2  LPM) and wheezing (intermittent).   Cardiovascular: Negative.  Negative for chest pain and palpitations.  Gastrointestinal:  Negative for abdominal pain, constipation, diarrhea, nausea and vomiting.  Neurological: Negative.   Psychiatric/Behavioral:  Negative for behavioral problems (Depression), sleep disturbance and suicidal ideas. The patient is not nervous/anxious.     Vital Signs: BP 110/76   Pulse 73   Temp 98.6 F (37 C)   Resp 16   Ht 5\' 8"  (1.727 m)   Wt 192 lb 12.8 oz (87.5 kg)   SpO2 95% Comment: 2L  BMI 29.32 kg/m    Physical Exam Vitals reviewed.  Constitutional:      General: She is awake. She is not in acute distress.    Appearance: Normal appearance. She is well-developed, well-groomed and overweight. She is not ill-appearing.  Interventions: Nasal cannula in place.  HENT:     Head: Normocephalic and atraumatic.  Eyes:     Pupils: Pupils are equal, round, and reactive to light.  Cardiovascular:     Rate and Rhythm: Normal rate and regular rhythm.     Heart sounds: Normal heart sounds. No murmur heard. Pulmonary:     Effort: Pulmonary effort is normal. No accessory muscle usage or respiratory distress.     Breath sounds: Normal air entry. Examination of the right-upper field reveals wheezing. Examination of the left-upper field reveals wheezing. Examination of the right-middle field reveals wheezing. Examination of the left-middle field reveals wheezing. Examination of the right-lower field reveals decreased breath sounds. Examination of the left-lower field reveals decreased breath sounds. Decreased breath sounds and wheezing present. No rales.  Neurological:     Mental Status: She is alert and oriented to person, place, and time.  Psychiatric:        Mood and Affect: Mood normal.        Behavior: Behavior normal. Behavior is cooperative.        Assessment/Plan: 1. Obstructive chronic bronchitis without exacerbation (HCC) PFT ordered, follow up for  results. Continue spiriva, dulera and prn albuterol as prescribed.  - Pulmonary function test; Future - Tiotropium Bromide Monohydrate (SPIRIVA RESPIMAT) 2.5 MCG/ACT AERS; Inhale 2 puffs into the lungs daily.  Dispense: 4 g; Refill: 6 - mometasone-formoterol (DULERA) 100-5 MCG/ACT AERO; Inhale 2 puffs into the lungs 2 (two) times daily.  Dispense: 13 g; Refill: 11 - albuterol (VENTOLIN HFA) 108 (90 Base) MCG/ACT inhaler; Inhale 2 puffs into the lungs every 6 (six) hours as needed for wheezing or shortness of breath.  Dispense: 18 g; Refill: 3  2. Chronic respiratory failure with hypoxia (HCC) Continue continuous supplemental oxygen use as instructed. Continue inhaled medications as prescribed.  - Pulmonary function test; Future - Tiotropium Bromide Monohydrate (SPIRIVA RESPIMAT) 2.5 MCG/ACT AERS; Inhale 2 puffs into the lungs daily.  Dispense: 4 g; Refill: 6 - mometasone-formoterol (DULERA) 100-5 MCG/ACT AERO; Inhale 2 puffs into the lungs 2 (two) times daily.  Dispense: 13 g; Refill: 11 - albuterol (VENTOLIN HFA) 108 (90 Base) MCG/ACT inhaler; Inhale 2 puffs into the lungs every 6 (six) hours as needed for wheezing or shortness of breath.  Dispense: 18 g; Refill: 3  3. Oxygen dependent Continue 2 LPM continuous supplemental oxygen  4. Cigarette nicotine dependence with nicotine-induced disorder Not interested in quitting smoking, patient aware of the potential risks.    General Counseling: arch plant understanding of the findings of todays visit and agrees with plan of treatment. I have discussed any further diagnostic evaluation that may be needed or ordered today. We also reviewed her medications today. she has been encouraged to call the office with any questions or concerns that should arise related to todays visit.    Orders Placed This Encounter  Procedures   Pulmonary function test    Meds ordered this encounter  Medications   Tiotropium Bromide Monohydrate (SPIRIVA  RESPIMAT) 2.5 MCG/ACT AERS    Sig: Inhale 2 puffs into the lungs daily.    Dispense:  4 g    Refill:  6   mometasone-formoterol (DULERA) 100-5 MCG/ACT AERO    Sig: Inhale 2 puffs into the lungs 2 (two) times daily.    Dispense:  13 g    Refill:  11   albuterol (VENTOLIN HFA) 108 (90 Base) MCG/ACT inhaler    Sig: Inhale 2 puffs into the lungs every 6 (  six) hours as needed for wheezing or shortness of breath.    Dispense:  18 g    Refill:  3    Ok to fill ventolin or proventil or generic    Return in about 2 months (around 02/19/2023) for F/U, pulmonary only, Naryiah Schley  for PFT results .   Total time spent:30 Minutes Time spent includes review of chart, medications, test results, and follow up plan with the patient.   [place] Controlled Substance Database was reviewed by me.  This patient was seen by Sallyanne Kuster, FNP-C in collaboration with Dr. Beverely Risen as a part of collaborative care agreement.   Hiilei Gerst R. Tedd Sias, MSN, FNP-C Internal medicine

## 2023-01-12 DIAGNOSIS — F649 Gender identity disorder, unspecified: Secondary | ICD-10-CM | POA: Diagnosis not present

## 2023-01-13 DIAGNOSIS — R112 Nausea with vomiting, unspecified: Secondary | ICD-10-CM | POA: Diagnosis not present

## 2023-01-13 DIAGNOSIS — Z79890 Hormone replacement therapy (postmenopausal): Secondary | ICD-10-CM | POA: Diagnosis not present

## 2023-01-13 DIAGNOSIS — Z7982 Long term (current) use of aspirin: Secondary | ICD-10-CM | POA: Diagnosis not present

## 2023-01-13 DIAGNOSIS — F17210 Nicotine dependence, cigarettes, uncomplicated: Secondary | ICD-10-CM | POA: Diagnosis not present

## 2023-01-13 DIAGNOSIS — J439 Emphysema, unspecified: Secondary | ICD-10-CM | POA: Diagnosis not present

## 2023-01-13 DIAGNOSIS — I4510 Unspecified right bundle-branch block: Secondary | ICD-10-CM | POA: Diagnosis not present

## 2023-01-13 DIAGNOSIS — E785 Hyperlipidemia, unspecified: Secondary | ICD-10-CM | POA: Diagnosis not present

## 2023-01-13 DIAGNOSIS — Z79899 Other long term (current) drug therapy: Secondary | ICD-10-CM | POA: Diagnosis not present

## 2023-01-17 ENCOUNTER — Ambulatory Visit (INDEPENDENT_AMBULATORY_CARE_PROVIDER_SITE_OTHER): Payer: 59 | Admitting: Internal Medicine

## 2023-01-17 DIAGNOSIS — J9611 Chronic respiratory failure with hypoxia: Secondary | ICD-10-CM

## 2023-01-17 DIAGNOSIS — J4489 Other specified chronic obstructive pulmonary disease: Secondary | ICD-10-CM | POA: Diagnosis not present

## 2023-01-20 DIAGNOSIS — J449 Chronic obstructive pulmonary disease, unspecified: Secondary | ICD-10-CM | POA: Diagnosis not present

## 2023-01-24 DIAGNOSIS — F640 Transsexualism: Secondary | ICD-10-CM | POA: Diagnosis not present

## 2023-01-24 DIAGNOSIS — Z0000 Encounter for general adult medical examination without abnormal findings: Secondary | ICD-10-CM | POA: Diagnosis not present

## 2023-01-24 DIAGNOSIS — R0609 Other forms of dyspnea: Secondary | ICD-10-CM | POA: Diagnosis not present

## 2023-02-01 ENCOUNTER — Encounter: Payer: Self-pay | Admitting: Nurse Practitioner

## 2023-02-01 ENCOUNTER — Ambulatory Visit (INDEPENDENT_AMBULATORY_CARE_PROVIDER_SITE_OTHER): Payer: 59 | Admitting: Nurse Practitioner

## 2023-02-01 VITALS — BP 130/74 | HR 79 | Temp 98.5°F | Resp 16 | Ht 68.0 in | Wt 192.8 lb

## 2023-02-01 DIAGNOSIS — J9611 Chronic respiratory failure with hypoxia: Secondary | ICD-10-CM

## 2023-02-01 DIAGNOSIS — Z9981 Dependence on supplemental oxygen: Secondary | ICD-10-CM

## 2023-02-01 DIAGNOSIS — F17219 Nicotine dependence, cigarettes, with unspecified nicotine-induced disorders: Secondary | ICD-10-CM | POA: Diagnosis not present

## 2023-02-01 DIAGNOSIS — J4489 Other specified chronic obstructive pulmonary disease: Secondary | ICD-10-CM

## 2023-02-01 NOTE — Progress Notes (Signed)
[facility] [address] [place], [place] [address]  Internal MEDICINE  Office Visit Note  Patient Name: Clifford Burgess  [id number]  [id number]  Date of Service: 02/01/2023  Chief Complaint  Patient presents with   Follow-up    Review pft    HPI Clifford Burgess presents for a follow-up visit for COPD and PFT results.  PFT results significantly improved FEV1 and FVC compared to November. Quit smoking 3 days ago. Has not needed her oxygen as much.  COPD -- still using spiriva and dulera as prescribed and albuterol as needed.  Oxygen dependent -- has not needed to use her oxygen as continuously as before.  Pulmonary clearance for upcoming surgery.     Current Medication: Outpatient Encounter Medications as of 02/01/2023  Medication Sig   albuterol (VENTOLIN HFA) 108 (90 Base) MCG/ACT inhaler Inhale 2 puffs into the lungs every 6 (six) hours as needed for wheezing or shortness of breath.   aspirin EC 81 MG tablet Take 81 mg by mouth daily.   dicyclomine (BENTYL) 20 MG tablet Take 1 tablet (20 mg total) by mouth every 6 (six) hours.   estradiol valerate (DELESTROGEN) 40 MG/ML injection Inject 40 mg into the muscle every 28 (twenty-eight) days. Patient gets this medication from [facility].   ipratropium-albuterol (DUONEB) 0.5-2.5 (3) MG/3ML SOLN INHALE 3 ML BY NEBULIZER EVERY 6 HOURS AS NEEDED   lactulose (CHRONULAC) 10 GM/15ML solution Take 30 mLs (20 g total) by mouth daily as needed for mild constipation.   mometasone-formoterol (DULERA) 100-5 MCG/ACT AERO Inhale 2 puffs into the lungs 2 (two) times daily.   omeprazole (PRILOSEC) 20 MG capsule TAKE 1 CAPSULE BY MOUTH EVERY DAY   ondansetron (ZOFRAN-ODT) 4 MG disintegrating tablet Take 1 tablet (4 mg total) by mouth every 8 (eight) hours as needed for nausea or vomiting.   OXYGEN Inhale 2 L into the lungs. At night pt uses 24 hrs from [facility]   progesterone (PROMETRIUM) 200 MG capsule SMARTSIG:200  Milligram(s) By Mouth Every Evening   rosuvastatin (CRESTOR) 10 MG tablet Take 1 tablet (10 mg total) by mouth daily.   spironolactone (ALDACTONE) 50 MG tablet Take 1 tablet (50 mg total) by mouth 2 (two) times daily.   Tiotropium Bromide Monohydrate (SPIRIVA RESPIMAT) 2.5 MCG/ACT AERS Inhale 2 puffs into the lungs daily.   No facility-administered encounter medications on file as of 02/01/2023.    Surgical History: Past Surgical History:  Procedure Laterality Date   HERNIA REPAIR      Medical History: Past Medical History:  Diagnosis Date   Asthma    COPD (chronic obstructive pulmonary disease) (HCC)    GERD (gastroesophageal reflux disease)     Family History: Family History  Problem Relation Age of Onset   Hypertension Father     Social History   Socioeconomic History   Marital status: Divorced    Spouse name: Not on file   Number of children: 2   Years of education: Not on file   Highest education level: 8th grade  Occupational History   Not on file  Tobacco Use   Smoking status: Former    Current packs/day: 1.00    Average packs/day: 1 pack/day for 36.0 years (36.0 ttl pk-yrs)    Types: Cigarettes   Smokeless tobacco: Former    Types: Chew   Tobacco comments:    1/2 - 1 pack a day.  Started smoking age 67.  Vaping Use   Vaping status: Never Used  Substance and Sexual Activity   Alcohol use: Not Currently    Alcohol/week: 2.0 standard drinks of alcohol    Types: 2 Cans of beer per week    Comment: occasional   Drug use: Yes    Types: Cocaine, Marijuana, Heroin, LSD, "Crack" cocaine    Comment: 11/10/21  Pt reports drug use "last week"   Sexual activity: Not Currently  Other Topics Concern   Not on file  Social History Narrative   Not on file   Social Drivers of Health   Financial Resource Strain: High Risk (11/27/2017)   Overall Financial Resource Strain (CARDIA)    Difficulty of Paying Living Expenses: Hard  Food Insecurity: Food Insecurity  Present (11/27/2017)   Hunger Vital Sign    Worried About Running Out of Food in the Last Year: Often true    Ran Out of Food in the Last Year: Often true  Transportation Needs: No Transportation Needs (11/27/2017)   PRAPARE - Administrator, Civil Service (Medical): No    Lack of Transportation (Non-Medical): No  Physical Activity: Inactive (11/27/2017)   Exercise Vital Sign    Days of Exercise per Week: 0 days    Minutes of Exercise per Session: 0 min  Stress: No Stress Concern Present (11/27/2017)   [employer] of Occupational Health - Occupational Stress Questionnaire    Feeling of Stress : Not at all  Social Connections: Unknown (11/27/2017)   Social Connection and Isolation Panel [NHANES]    Frequency of Communication with Friends and Family: Not on file    Frequency of Social Gatherings with Friends and Family: Not on file    Attends Religious Services: Never    Database administrator or Organizations: No    Attends Banker Meetings: Never    Marital Status: Divorced  Catering manager Violence: Unknown (11/27/2017)   Humiliation, Afraid, Rape, and Kick questionnaire    Fear of Current or Ex-Partner: Not on file    Emotionally Abused: No    Physically Abused: No    Sexually Abused: No      Review of Systems  Constitutional:  Negative for appetite change, chills, fatigue and unexpected weight change.  HENT:  Negative for congestion, rhinorrhea, sneezing and sore throat.   Respiratory:  Positive for cough (smoker's cough), chest tightness (intermittent), shortness of breath (using continuous oxygen. 2 LPM) and wheezing (intermittent).   Cardiovascular: Negative.  Negative for chest pain and palpitations.  Gastrointestinal:  Negative for abdominal pain, constipation, diarrhea, nausea and vomiting.  Neurological: Negative.   Psychiatric/Behavioral:  Negative for behavioral problems (Depression), sleep disturbance and suicidal ideas. The patient  is not nervous/anxious.     Vital Signs: BP 130/74   Pulse 79   Temp 98.5 F (36.9 C)   Resp 16   Ht 5\' 8"  (1.727 m)   Wt 192 lb 12.8 oz (87.5 kg)   SpO2 97% Comment: 2L  BMI 29.32 kg/m    Physical Exam Vitals reviewed.  Constitutional:      General: She is awake. She is not in acute distress.    Appearance: Normal appearance. She is well-developed, well-groomed and overweight. She is not ill-appearing.     Interventions: Nasal cannula in place.  HENT:     Head: Normocephalic and atraumatic.  Eyes:     Pupils: Pupils are equal, round, and reactive to light.  Cardiovascular:     Rate and Rhythm: Normal rate and regular rhythm.     Heart  sounds: Normal heart sounds. No murmur heard. Pulmonary:     Effort: Pulmonary effort is normal. No accessory muscle usage or respiratory distress.     Breath sounds: Normal breath sounds and air entry. No wheezing or rales.  Neurological:     Mental Status: She is alert and oriented to person, place, and time.  Psychiatric:        Mood and Affect: Mood normal.        Behavior: Behavior normal. Behavior is cooperative.        Assessment/Plan: 1. Obstructive chronic bronchitis without exacerbation (HCC) (Primary) Continue dulera and spiriva as prescribed. Has duoneb treatments as needed and a rescue inhaler as needed as well. Remains on continuous oxygen at 2 LPM via [place].   2. Chronic respiratory failure with hypoxia (HCC) Has continuous supplemental oxygen, has not needed her oxygen all the time and does not wear it for short periods when at home.   3. Oxygen dependent Has been able to go without her oxygen sometimes.   4. Cigarette nicotine dependence with nicotine-induced disorder Stopped smoking recently, has already noticed improvement in her breathing    General Counseling: Lytle Michaels understanding of the findings of todays visit and agrees with plan of treatment. I have discussed any further diagnostic evaluation  that may be needed or ordered today. We also reviewed her medications today. she has been encouraged to call the office with any questions or concerns that should arise related to todays visit.    No orders of the defined types were placed in this encounter.   No orders of the defined types were placed in this encounter.   Return in about 6 months (around 08/01/2023) for F/U, pulmonary only, Stepfanie Yott.   Total time spent:30 Minutes Time spent includes review of chart, medications, test results, and follow up plan with the patient.   [place] Controlled Substance Database was reviewed by me.  This patient was seen by Sallyanne Kuster, FNP-C in collaboration with Dr. Beverely Risen as a part of collaborative care agreement.   Deaisha Welborn R. Tedd Sias, MSN, FNP-C Internal medicine

## 2023-02-07 DIAGNOSIS — J439 Emphysema, unspecified: Secondary | ICD-10-CM | POA: Diagnosis not present

## 2023-02-07 DIAGNOSIS — R519 Headache, unspecified: Secondary | ICD-10-CM | POA: Diagnosis not present

## 2023-02-07 DIAGNOSIS — R112 Nausea with vomiting, unspecified: Secondary | ICD-10-CM | POA: Diagnosis not present

## 2023-02-07 DIAGNOSIS — R42 Dizziness and giddiness: Secondary | ICD-10-CM | POA: Diagnosis not present

## 2023-02-07 DIAGNOSIS — H9319 Tinnitus, unspecified ear: Secondary | ICD-10-CM | POA: Diagnosis not present

## 2023-02-07 DIAGNOSIS — E785 Hyperlipidemia, unspecified: Secondary | ICD-10-CM | POA: Diagnosis not present

## 2023-02-07 DIAGNOSIS — F17210 Nicotine dependence, cigarettes, uncomplicated: Secondary | ICD-10-CM | POA: Diagnosis not present

## 2023-02-07 DIAGNOSIS — Z79899 Other long term (current) drug therapy: Secondary | ICD-10-CM | POA: Diagnosis not present

## 2023-02-12 ENCOUNTER — Other Ambulatory Visit: Payer: Self-pay | Admitting: Physician Assistant

## 2023-02-12 DIAGNOSIS — R0609 Other forms of dyspnea: Secondary | ICD-10-CM | POA: Diagnosis not present

## 2023-02-12 DIAGNOSIS — K219 Gastro-esophageal reflux disease without esophagitis: Secondary | ICD-10-CM

## 2023-02-12 DIAGNOSIS — Z0000 Encounter for general adult medical examination without abnormal findings: Secondary | ICD-10-CM | POA: Diagnosis not present

## 2023-02-12 DIAGNOSIS — F640 Transsexualism: Secondary | ICD-10-CM | POA: Diagnosis not present

## 2023-02-13 DIAGNOSIS — F17210 Nicotine dependence, cigarettes, uncomplicated: Secondary | ICD-10-CM | POA: Diagnosis not present

## 2023-02-13 DIAGNOSIS — N529 Male erectile dysfunction, unspecified: Secondary | ICD-10-CM | POA: Diagnosis not present

## 2023-02-13 DIAGNOSIS — K5190 Ulcerative colitis, unspecified, without complications: Secondary | ICD-10-CM | POA: Diagnosis not present

## 2023-02-13 DIAGNOSIS — E785 Hyperlipidemia, unspecified: Secondary | ICD-10-CM | POA: Diagnosis not present

## 2023-02-13 DIAGNOSIS — Z833 Family history of diabetes mellitus: Secondary | ICD-10-CM | POA: Diagnosis not present

## 2023-02-13 DIAGNOSIS — Z79890 Hormone replacement therapy (postmenopausal): Secondary | ICD-10-CM | POA: Diagnosis not present

## 2023-02-13 DIAGNOSIS — J9610 Chronic respiratory failure, unspecified whether with hypoxia or hypercapnia: Secondary | ICD-10-CM | POA: Diagnosis not present

## 2023-02-13 DIAGNOSIS — J8410 Pulmonary fibrosis, unspecified: Secondary | ICD-10-CM | POA: Diagnosis not present

## 2023-02-13 DIAGNOSIS — J449 Chronic obstructive pulmonary disease, unspecified: Secondary | ICD-10-CM | POA: Diagnosis not present

## 2023-02-13 DIAGNOSIS — K08109 Complete loss of teeth, unspecified cause, unspecified class: Secondary | ICD-10-CM | POA: Diagnosis not present

## 2023-02-13 DIAGNOSIS — K219 Gastro-esophageal reflux disease without esophagitis: Secondary | ICD-10-CM | POA: Diagnosis not present

## 2023-02-13 DIAGNOSIS — Z9981 Dependence on supplemental oxygen: Secondary | ICD-10-CM | POA: Diagnosis not present

## 2023-02-15 ENCOUNTER — Ambulatory Visit: Payer: 59 | Admitting: Nurse Practitioner

## 2023-02-18 ENCOUNTER — Encounter: Payer: Self-pay | Admitting: Nurse Practitioner

## 2023-02-20 DIAGNOSIS — J449 Chronic obstructive pulmonary disease, unspecified: Secondary | ICD-10-CM | POA: Diagnosis not present

## 2023-03-01 ENCOUNTER — Encounter: Payer: Self-pay | Admitting: Nurse Practitioner

## 2023-03-01 ENCOUNTER — Telehealth (INDEPENDENT_AMBULATORY_CARE_PROVIDER_SITE_OTHER): Payer: 59 | Admitting: Nurse Practitioner

## 2023-03-01 DIAGNOSIS — R42 Dizziness and giddiness: Secondary | ICD-10-CM

## 2023-03-01 MED ORDER — MECLIZINE HCL 25 MG PO TABS
25.0000 mg | ORAL_TABLET | Freq: Three times a day (TID) | ORAL | 2 refills | Status: DC | PRN
Start: 2023-03-01 — End: 2023-10-08

## 2023-03-01 NOTE — Progress Notes (Signed)
 [facility] [address] [place], [place] [address]  Internal MEDICINE  Telephone Visit  Patient Name: Clifford Burgess  [id number]  [id number]  Date of Service: 03/01/2023  I connected with the patient at 1030 by telephone and verified the patients identity using two identifiers.   I discussed the limitations, risks, security and privacy concerns of performing an evaluation and management service by telephone and the availability of in person appointments. I also discussed with the patient that there may be a patient responsible charge related to the service.  The patient expressed understanding and agrees to proceed.    Chief Complaint  Patient presents with   Telephone Screen   Telephone Assessment    Ed follow up vertigo    HPI Clifford Burgess presents for a telehealth virtual visit for recent ED visit for vertigo She went to [facility] ED on 02/07/2023 for vertigo. She was given information regarding epley maneuver.  Was also given meclizine as needed, is requesting a refill  ENT appointment is scheduled for tomorrow.     Current Medication: Outpatient Encounter Medications as of 03/01/2023  Medication Sig   albuterol (VENTOLIN HFA) 108 (90 Base) MCG/ACT inhaler Inhale 2 puffs into the lungs every 6 (six) hours as needed for wheezing or shortness of breath.   aspirin EC 81 MG tablet Take 81 mg by mouth daily.   dicyclomine (BENTYL) 20 MG tablet Take 1 tablet (20 mg total) by mouth every 6 (six) hours.   estradiol valerate (DELESTROGEN) 40 MG/ML injection Inject 40 mg into the muscle every 28 (twenty-eight) days. Patient gets this medication from [facility].   ipratropium-albuterol (DUONEB) 0.5-2.5 (3) MG/3ML SOLN INHALE 3 ML BY NEBULIZER EVERY 6 HOURS AS NEEDED   lactulose (CHRONULAC) 10 GM/15ML solution Take 30 mLs (20 g total) by mouth daily as needed for mild constipation.   mometasone-formoterol (DULERA) 100-5 MCG/ACT AERO Inhale 2 puffs into the lungs 2 (two) times  daily.   omeprazole (PRILOSEC) 20 MG capsule TAKE 1 CAPSULE BY MOUTH EVERY DAY   ondansetron (ZOFRAN-ODT) 4 MG disintegrating tablet Take 1 tablet (4 mg total) by mouth every 8 (eight) hours as needed for nausea or vomiting.   OXYGEN Inhale 2 L into the lungs. At night pt uses 24 hrs from [facility]   progesterone (PROMETRIUM) 200 MG capsule SMARTSIG:200 Milligram(s) By Mouth Every Evening   rosuvastatin (CRESTOR) 10 MG tablet Take 1 tablet (10 mg total) by mouth daily.   spironolactone (ALDACTONE) 50 MG tablet Take 1 tablet (50 mg total) by mouth 2 (two) times daily.   Tiotropium Bromide Monohydrate (SPIRIVA RESPIMAT) 2.5 MCG/ACT AERS Inhale 2 puffs into the lungs daily.   [DISCONTINUED] meclizine (ANTIVERT) 25 MG tablet Take by mouth.   meclizine (ANTIVERT) 25 MG tablet Take 1 tablet (25 mg total) by mouth 3 (three) times daily as needed for dizziness.   No facility-administered encounter medications on file as of 03/01/2023.    Surgical History: Past Surgical History:  Procedure Laterality Date   HERNIA REPAIR      Medical History: Past Medical History:  Diagnosis Date   Asthma    COPD (chronic obstructive pulmonary disease) (HCC)    GERD (gastroesophageal reflux disease)     Family History: Family History  Problem Relation Age of Onset   Hypertension Father     Social History   Socioeconomic History   Marital status: Divorced    Spouse name: Not on file   Number of children: 2  Years of education: Not on file   Highest education level: 8th grade  Occupational History   Not on file  Tobacco Use   Smoking status: Some Days    Current packs/day: 1.00    Average packs/day: 1 pack/day for 36.0 years (36.0 ttl pk-yrs)    Types: Cigarettes   Smokeless tobacco: Former    Types: Chew   Tobacco comments:    1/2 - 1 pack a day.  Started smoking age 68./3 a day  Vaping Use   Vaping status: Never Used  Substance and Sexual Activity   Alcohol use: Not Currently     Alcohol/week: 2.0 standard drinks of alcohol    Types: 2 Cans of beer per week    Comment: occasional   Drug use: Yes    Types: Cocaine, Marijuana, Heroin, LSD, "Crack" cocaine    Comment: 11/10/21  Pt reports drug use "last week"   Sexual activity: Not Currently  Other Topics Concern   Not on file  Social History Narrative   Not on file   Social Drivers of Health   Financial Resource Strain: High Risk (11/27/2017)   Overall Financial Resource Strain (CARDIA)    Difficulty of Paying Living Expenses: Hard  Food Insecurity: Food Insecurity Present (11/27/2017)   Hunger Vital Sign    Worried About Running Out of Food in the Last Year: Often true    Ran Out of Food in the Last Year: Often true  Transportation Needs: No Transportation Needs (11/27/2017)   PRAPARE - Administrator, Civil Service (Medical): No    Lack of Transportation (Non-Medical): No  Physical Activity: Inactive (11/27/2017)   Exercise Vital Sign    Days of Exercise per Week: 0 days    Minutes of Exercise per Session: 0 min  Stress: No Stress Concern Present (11/27/2017)   [employer] of Occupational Health - Occupational Stress Questionnaire    Feeling of Stress : Not at all  Social Connections: Unknown (11/27/2017)   Social Connection and Isolation Panel [NHANES]    Frequency of Communication with Friends and Family: Not on file    Frequency of Social Gatherings with Friends and Family: Not on file    Attends Religious Services: Never    Active Member of Clubs or Organizations: No    Attends Banker Meetings: Never    Marital Status: Divorced  Catering manager Violence: Unknown (11/27/2017)   Humiliation, Afraid, Rape, and Kick questionnaire    Fear of Current or Ex-Partner: Not on file    Emotionally Abused: No    Physically Abused: No    Sexually Abused: No      Review of Systems  Constitutional:  Positive for fatigue.  Respiratory:  Positive for shortness of breath.  Negative for cough, chest tightness and wheezing.   Cardiovascular: Negative.  Negative for chest pain and palpitations.  Neurological:  Positive for dizziness and light-headedness.    Vital Signs: There were no vitals taken for this visit.   Observation/Objective: She is alert and oriented. No acute distress noted.     Assessment/Plan: 1. Vertigo (Primary) Continue prn meclizine as prescribed. Has ENT appointment tomorrow.  - meclizine (ANTIVERT) 25 MG tablet; Take 1 tablet (25 mg total) by mouth 3 (three) times daily as needed for dizziness.  Dispense: 90 tablet; Refill: 2   General Counseling: Clifford Burgess verbalizes understanding of the findings of today's phone visit and agrees with plan of treatment. I have discussed any further diagnostic evaluation that may  be needed or ordered today. We also reviewed her medications today. she has been encouraged to call the office with any questions or concerns that should arise related to todays visit.  Return if symptoms worsen or fail to improve, for keep regular scheduled follow ups .   No orders of the defined types were placed in this encounter.   Meds ordered this encounter  Medications   meclizine (ANTIVERT) 25 MG tablet    Sig: Take 1 tablet (25 mg total) by mouth 3 (three) times daily as needed for dizziness.    Dispense:  90 tablet    Refill:  2    Please fill new script today    Time spent:10 Minutes Time spent with patient included reviewing progress notes, labs, imaging studies, and discussing plan for follow up.  [place] Controlled Substance Database was reviewed by me for overdose risk score (ORS) if appropriate.  This patient was seen by Sallyanne Kuster, FNP-C in collaboration with Dr. Beverely Risen as a part of collaborative care agreement.  Marabelle Cushman R. Tedd Sias, MSN, FNP-C Internal medicine

## 2023-03-02 DIAGNOSIS — H9042 Sensorineural hearing loss, unilateral, left ear, with unrestricted hearing on the contralateral side: Secondary | ICD-10-CM | POA: Diagnosis not present

## 2023-03-02 DIAGNOSIS — H9313 Tinnitus, bilateral: Secondary | ICD-10-CM | POA: Diagnosis not present

## 2023-03-02 DIAGNOSIS — H903 Sensorineural hearing loss, bilateral: Secondary | ICD-10-CM | POA: Diagnosis not present

## 2023-03-02 DIAGNOSIS — R42 Dizziness and giddiness: Secondary | ICD-10-CM | POA: Diagnosis not present

## 2023-03-13 DIAGNOSIS — I2510 Atherosclerotic heart disease of native coronary artery without angina pectoris: Secondary | ICD-10-CM | POA: Diagnosis not present

## 2023-03-15 ENCOUNTER — Encounter: Payer: Self-pay | Admitting: Cardiovascular Disease

## 2023-03-15 ENCOUNTER — Ambulatory Visit (INDEPENDENT_AMBULATORY_CARE_PROVIDER_SITE_OTHER): Payer: 59 | Admitting: Cardiovascular Disease

## 2023-03-15 ENCOUNTER — Institutional Professional Consult (permissible substitution): Payer: 59 | Admitting: Cardiovascular Disease

## 2023-03-15 VITALS — BP 138/83 | HR 82 | Ht 68.0 in | Wt 190.0 lb

## 2023-03-15 DIAGNOSIS — I10 Essential (primary) hypertension: Secondary | ICD-10-CM

## 2023-03-15 DIAGNOSIS — R0602 Shortness of breath: Secondary | ICD-10-CM

## 2023-03-15 DIAGNOSIS — J441 Chronic obstructive pulmonary disease with (acute) exacerbation: Secondary | ICD-10-CM | POA: Diagnosis not present

## 2023-03-15 DIAGNOSIS — K2100 Gastro-esophageal reflux disease with esophagitis, without bleeding: Secondary | ICD-10-CM

## 2023-03-15 DIAGNOSIS — F17200 Nicotine dependence, unspecified, uncomplicated: Secondary | ICD-10-CM

## 2023-03-15 DIAGNOSIS — Z01818 Encounter for other preprocedural examination: Secondary | ICD-10-CM | POA: Diagnosis not present

## 2023-03-15 NOTE — Progress Notes (Signed)
 Cardiology Office Note   Date:  03/15/2023   ID:  Clifford Burgess, DOB [birth year], MRN [id number]  PCP:  Carlean Jews, PA-C  Cardiologist:  Adrian Blackwater, MD      History of Present Illness: Clifford Burgess is a 53 y.o. adult who presents for No chief complaint on file.   Patient presents for evaluation as needs clearance for surgery. Denies any chest pain or SOB, or dizziness. Had already echo done at [facility] 02/12/23 which was unremarkable.      Past Medical History:  Diagnosis Date   Asthma    COPD (chronic obstructive pulmonary disease) (HCC)    GERD (gastroesophageal reflux disease)      Past Surgical History:  Procedure Laterality Date   HERNIA REPAIR       Current Outpatient Medications  Medication Sig Dispense Refill   albuterol (VENTOLIN HFA) 108 (90 Base) MCG/ACT inhaler Inhale 2 puffs into the lungs every 6 (six) hours as needed for wheezing or shortness of breath. 18 g 3   estradiol (ESTRACE) 1 MG tablet Take 1 mg by mouth 2 (two) times daily.     ipratropium-albuterol (DUONEB) 0.5-2.5 (3) MG/3ML SOLN INHALE 3 ML BY NEBULIZER EVERY 6 HOURS AS NEEDED 360 mL 1   lactulose (CHRONULAC) 10 GM/15ML solution Take 30 mLs (20 g total) by mouth daily as needed for mild constipation. 120 mL 0   meclizine (ANTIVERT) 25 MG tablet Take 1 tablet (25 mg total) by mouth 3 (three) times daily as needed for dizziness. 90 tablet 2   mometasone-formoterol (DULERA) 100-5 MCG/ACT AERO Inhale 2 puffs into the lungs 2 (two) times daily. 13 g 11   omeprazole (PRILOSEC) 20 MG capsule TAKE 1 CAPSULE BY MOUTH EVERY DAY 30 capsule 2   ondansetron (ZOFRAN-ODT) 4 MG disintegrating tablet Take 1 tablet (4 mg total) by mouth every 8 (eight) hours as needed for nausea or vomiting. 20 tablet 0   progesterone (PROMETRIUM) 200 MG capsule SMARTSIG:200 Milligram(s) By Mouth Every Evening     rosuvastatin (CRESTOR) 10 MG tablet Take 1 tablet (10 mg total) by mouth daily. 90 tablet 3    spironolactone (ALDACTONE) 50 MG tablet Take 1 tablet (50 mg total) by mouth 2 (two) times daily. 180 tablet 1   aspirin EC 81 MG tablet Take 81 mg by mouth daily. (Patient not taking: Reported on 03/15/2023)     dicyclomine (BENTYL) 20 MG tablet Take 1 tablet (20 mg total) by mouth every 6 (six) hours. (Patient not taking: Reported on 03/15/2023) 20 tablet 0   estradiol valerate (DELESTROGEN) 40 MG/ML injection Inject 40 mg into the muscle every 28 (twenty-eight) days. Patient gets this medication from [facility]. (Patient not taking: Reported on 03/15/2023)     OXYGEN Inhale 2 L into the lungs. At night pt uses 24 hrs from [facility] (Patient not taking: Reported on 03/15/2023)     Tiotropium Bromide Monohydrate (SPIRIVA RESPIMAT) 2.5 MCG/ACT AERS Inhale 2 puffs into the lungs daily. 4 g 6   No current facility-administered medications for this visit.    Allergies:   Chocolate    Social History:   reports that she has been smoking cigarettes. She has a 36 pack-year smoking history. She has quit using smokeless tobacco.  Her smokeless tobacco use included chew. She reports that she does not currently use alcohol after a past usage of about 2.0 standard drinks of alcohol per week. She reports current drug use. Drugs: Cocaine, Marijuana,  Heroin, LSD, and "Crack" cocaine.   Family History:  family history includes Hypertension in her father.    ROS:     Review of Systems  Constitutional: Negative.   HENT: Negative.    Eyes: Negative.   Respiratory: Negative.    Gastrointestinal: Negative.   Genitourinary: Negative.   Musculoskeletal: Negative.   Skin: Negative.   Neurological: Negative.   Endo/Heme/Allergies: Negative.   Psychiatric/Behavioral: Negative.    All other systems reviewed and are negative.     All other systems are reviewed and negative.    PHYSICAL EXAM: VS:  BP 138/83   Pulse 82   Ht 5\' 8"  (1.727 m)   Wt 190 lb (86.2 kg)   SpO2 97%   BMI 28.89 kg/m  ,  BMI Body mass index is 28.89 kg/m. Last weight:  Wt Readings from Last 3 Encounters:  03/15/23 190 lb (86.2 kg)  02/01/23 192 lb 12.8 oz (87.5 kg)  12/19/22 192 lb 12.8 oz (87.5 kg)     Physical Exam Vitals reviewed.  Constitutional:      Appearance: Normal appearance. She is normal weight.  HENT:     Head: Normocephalic.     Nose: Nose normal.     Mouth/Throat:     Mouth: Mucous membranes are moist.  Eyes:     Pupils: Pupils are equal, round, and reactive to light.  Cardiovascular:     Rate and Rhythm: Normal rate and regular rhythm.     Pulses: Normal pulses.     Heart sounds: Normal heart sounds.  Pulmonary:     Effort: Pulmonary effort is normal.  Abdominal:     General: Abdomen is flat. Bowel sounds are normal.  Musculoskeletal:        General: Normal range of motion.     Cervical back: Normal range of motion.  Skin:    General: Skin is warm.  Neurological:     General: No focal deficit present.     Mental Status: She is alert.  Psychiatric:        Mood and Affect: Mood normal.       EKG: NSR 77/min otherwise normal  Recent Labs: 03/27/2022: ALT 14; BUN 11; Creatinine, Ser 0.94; Hemoglobin 14.4; Platelets 376; Potassium 3.8; Sodium 135 07/05/2022: TSH 2.164    Lipid Panel    Component Value Date/Time   CHOL 161 07/05/2022 1056   CHOL 189 10/15/2017 1009   TRIG 65 07/05/2022 1056   HDL 63 07/05/2022 1056   HDL 48 10/15/2017 1009   CHOLHDL 2.6 07/05/2022 1056   VLDL 13 07/05/2022 1056   LDLCALC 85 07/05/2022 1056   LDLCALC 114 (H) 10/15/2017 1009      Other studies Reviewed: Additional studies/ records that were reviewed today include:  Review of the above records demonstrates:       No data to display            ASSESSMENT AND PLAN:    ICD-10-CM   1. SOB (shortness of breath)  R06.02    Had echo at [facility] and was told it was normal. ECHO showed on 02/12/23 LVEF 55%, trace TR/PR, no MR/AR. Advise proceeding with surgery.    2. Tobacco  use disorder  F17.200    Advise quit smoking    3. Gastroesophageal reflux disease with esophagitis without hemorrhage  K21.00     4. COPD exacerbation (HCC)  J44.1     5. Preoperative clearance  Z01.818    ECHO unremarkabel, ekg normal  and asymptomatic. patiet would be low risk for surgery . Advise proceeding with above sex change surgery.       Problem List Items Addressed This Visit       Respiratory   COPD exacerbation (HCC)     Digestive   GERD (gastroesophageal reflux disease)     Other   SOB (shortness of breath) - Primary   Tobacco use disorder   Other Visit Diagnoses       Preoperative clearance       ECHO unremarkabel, ekg normal and asymptomatic. patiet would be low risk for surgery . Advise proceeding with above sex change surgery.          Disposition:   Return today (on 03/15/2023) for PRN as needed.    Total time spent: 50 minutes  Signed,  Adrian Blackwater, MD  03/15/2023 2:36 PM    [facility]

## 2023-03-16 ENCOUNTER — Encounter: Payer: Self-pay | Admitting: Cardiovascular Disease

## 2023-03-20 DIAGNOSIS — J449 Chronic obstructive pulmonary disease, unspecified: Secondary | ICD-10-CM | POA: Diagnosis not present

## 2023-03-22 ENCOUNTER — Other Ambulatory Visit: Payer: Self-pay | Admitting: Internal Medicine

## 2023-03-22 DIAGNOSIS — J4489 Other specified chronic obstructive pulmonary disease: Secondary | ICD-10-CM

## 2023-03-23 DIAGNOSIS — E7849 Other hyperlipidemia: Secondary | ICD-10-CM | POA: Diagnosis not present

## 2023-03-23 DIAGNOSIS — H9312 Tinnitus, left ear: Secondary | ICD-10-CM | POA: Diagnosis not present

## 2023-03-23 DIAGNOSIS — H8112 Benign paroxysmal vertigo, left ear: Secondary | ICD-10-CM | POA: Diagnosis not present

## 2023-03-23 DIAGNOSIS — Z720 Tobacco use: Secondary | ICD-10-CM | POA: Diagnosis not present

## 2023-03-23 DIAGNOSIS — J449 Chronic obstructive pulmonary disease, unspecified: Secondary | ICD-10-CM | POA: Diagnosis not present

## 2023-03-23 DIAGNOSIS — I2510 Atherosclerotic heart disease of native coronary artery without angina pectoris: Secondary | ICD-10-CM | POA: Diagnosis not present

## 2023-03-23 DIAGNOSIS — H9042 Sensorineural hearing loss, unilateral, left ear, with unrestricted hearing on the contralateral side: Secondary | ICD-10-CM | POA: Diagnosis not present

## 2023-03-26 ENCOUNTER — Other Ambulatory Visit: Payer: Self-pay | Admitting: Otolaryngology

## 2023-03-26 DIAGNOSIS — H902 Conductive hearing loss, unspecified: Secondary | ICD-10-CM

## 2023-03-27 DIAGNOSIS — R42 Dizziness and giddiness: Secondary | ICD-10-CM | POA: Diagnosis not present

## 2023-04-07 ENCOUNTER — Encounter: Payer: Self-pay | Admitting: Otolaryngology

## 2023-04-08 ENCOUNTER — Other Ambulatory Visit: Payer: Self-pay | Admitting: Physician Assistant

## 2023-04-08 DIAGNOSIS — E782 Mixed hyperlipidemia: Secondary | ICD-10-CM

## 2023-04-09 ENCOUNTER — Other Ambulatory Visit: Payer: Self-pay | Admitting: Nurse Practitioner

## 2023-04-09 DIAGNOSIS — J4489 Other specified chronic obstructive pulmonary disease: Secondary | ICD-10-CM

## 2023-04-09 DIAGNOSIS — J9611 Chronic respiratory failure with hypoxia: Secondary | ICD-10-CM

## 2023-04-11 ENCOUNTER — Ambulatory Visit
Admission: RE | Admit: 2023-04-11 | Discharge: 2023-04-11 | Disposition: A | Discharge status: Home / Self Care | Source: Ambulatory Visit | Attending: Otolaryngology | Admitting: Otolaryngology

## 2023-04-11 DIAGNOSIS — H902 Conductive hearing loss, unspecified: Secondary | ICD-10-CM

## 2023-04-17 ENCOUNTER — Ambulatory Visit: Admission: EM | Admit: 2023-04-17 | Discharge: 2023-04-17 | Disposition: A | Discharge status: Home / Self Care

## 2023-04-17 DIAGNOSIS — R42 Dizziness and giddiness: Secondary | ICD-10-CM | POA: Diagnosis not present

## 2023-04-17 DIAGNOSIS — R519 Headache, unspecified: Secondary | ICD-10-CM

## 2023-04-17 DIAGNOSIS — H539 Unspecified visual disturbance: Secondary | ICD-10-CM | POA: Diagnosis not present

## 2023-04-17 DIAGNOSIS — G8929 Other chronic pain: Secondary | ICD-10-CM

## 2023-04-17 LAB — GLUCOSE, CAPILLARY: Glucose-Capillary: 105 mg/dL — ABNORMAL HIGH (ref 70–99)

## 2023-04-17 MED ORDER — KETOROLAC TROMETHAMINE 10 MG PO TABS: 10.0000 mg | ORAL_TABLET | Freq: Four times a day (QID) | ORAL | 0 refills | Status: DC | PRN

## 2023-04-17 MED ORDER — KETOROLAC TROMETHAMINE 60 MG/2ML IM SOLN
30.0000 mg | Freq: Once | INTRAMUSCULAR | Status: AC
  Administered 2023-04-17: 30 mg via INTRAMUSCULAR

## 2023-04-17 NOTE — ED Provider Notes (Signed)
 [facility]    CSN: [id number] Arrival date & time: 04/17/23  1608      History   Chief Complaint Chief Complaint  Patient presents with   Headache   Dizziness   Blurred Vision    HPI Clifford Burgess is a 53 y.o. adult transgender male with history of asthma, COPD, tobacco abuse, GERD, conductive hearing loss, sensorineural hearing loss bilaterally, bilateral tinnitus and dizziness. Patient presents with sister.   Patient reports chronic daily headaches over the past 3 months.  Also reports dizziness and intermittent blurred vision.  Patient never takes anything for her headaches and says she just tries to lay down and sleep it off.  Her sister says whenever she lays down a lot of her symptoms resolve.  Patient has had some intermittent hearing issues and has been seen by ENT for this with associated dizziness and tinnitus.  A brain MRI was ordered which she tried to have done the other day but they were unable to start an IV so she was advised to reschedule.  She has not rescheduled the appointment.  She occasionally takes meclizine as needed for dizziness and says it helps.  Tries not to take any medication at all.  Patient reports concern for elevated blood sugar.  She says she checked an A1c at home by herself and it was 6.3 last year.  Family history is significant for diabetes.  She says she is very active and tries to watch her diet but does like to eat.  Saw optometrist a couple of weeks ago and had new glasses ordered.  Still waiting on them.  States vision has gotten worse.  Of note, patient has been on hormone replacement therapy for the past 4 years and has not had any issues.  Hopes to have upcoming gender confirmation surgery.  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 Authorizing Provider  albuterol (VENTOLIN HFA) 108 (90 Base) MCG/ACT inhaler TAKE 2 PUFFS BY MOUTH EVERY 6 HOURS AS NEEDED FOR WHEEZE OR SHORTNESS OF BREATH 04/09/23  Yes Abernathy, Alyssa, NP  dutasteride (AVODART) 0.5 MG capsule Take 0.5 mg by mouth daily. 04/13/23  Yes [provider]  estradiol (ESTRACE) 1 MG tablet Take 1 mg by mouth 2 (two) times daily. 02/23/23  Yes [provider]  ipratropium-albuterol (DUONEB) 0.5-2.5 (3) MG/3ML SOLN INHALE 3 ML (1 VIAL) BY NEBULIZER EVERY 6 HOURS AS NEEDED 03/22/23  Yes McDonough, Lauren K, PA-C  ketorolac (TORADOL) 10 MG tablet Take 1 tablet (10 mg total) by mouth every 6 (six) hours as needed for moderate pain (pain score 4-6). 04/17/23  Yes Shirlee Latch, PA-C  lactulose (CHRONULAC) 10 GM/15ML solution Take 30 mLs (20  g total) by mouth daily as needed for mild constipation. 03/28/22  Yes Irean Hong, MD  meclizine (ANTIVERT) 25 MG tablet Take 1 tablet (25 mg total) by mouth 3 (three) times daily as needed for dizziness. 03/01/23  Yes Abernathy, Arlyss Repress, NP  metoprolol tartrate (LOPRESSOR) 50 MG tablet Take 50 mg by mouth 2 (two) times daily. 01/24/23  Yes [provider]  mometasone-formoterol (DULERA) 100-5 MCG/ACT AERO Inhale 2 puffs into the lungs 2 (two) times daily. 12/19/22  Yes Abernathy, Arlyss Repress, NP  omeprazole (PRILOSEC) 20 MG capsule TAKE 1 CAPSULE BY MOUTH EVERY DAY 02/12/23  Yes McDonough,  Lauren K, PA-C  progesterone (PROMETRIUM) 200 MG capsule SMARTSIG:200 Milligram(s) By Mouth Every Evening 11/07/22  Yes [provider]  rosuvastatin (CRESTOR) 10 MG tablet TAKE 1 TABLET BY MOUTH EVERY DAY 04/09/23  Yes McDonough, Lauren K, PA-C  spironolactone (ALDACTONE) 50 MG tablet Take 1 tablet (50 mg total) by mouth 2 (two) times daily. 10/04/21  Yes Lyndon Code, MD  Tiotropium Bromide Monohydrate (SPIRIVA RESPIMAT) 2.5 MCG/ACT AERS Inhale 2 puffs into the lungs daily. 12/19/22  Yes Abernathy, Arlyss Repress, NP  aspirin EC 81 MG tablet Take 81 mg by mouth daily. Patient not taking: Reported on 03/15/2023 01/09/19   [provider]  dicyclomine (BENTYL) 20 MG tablet Take 1 tablet (20 mg total) by mouth every 6 (six) hours. Patient not taking: Reported on 03/15/2023 03/28/22   Irean Hong, MD  estradiol valerate (DELESTROGEN) 40 MG/ML injection Inject 40 mg into the muscle every 28 (twenty-eight) days. Patient gets this medication from [facility]. Patient not taking: Reported on 03/15/2023 07/20/22   [provider]  ondansetron (ZOFRAN-ODT) 4 MG disintegrating tablet Take 1 tablet (4 mg total) by mouth every 8 (eight) hours as needed for nausea or vomiting. 03/28/22   Irean Hong, MD  OXYGEN Inhale 2 L into the lungs. At night pt uses 24 hrs from [facility] Patient not taking: Reported on 03/15/2023    [provider]    Family History Family History  Problem Relation Age of Onset   Hypertension Father     Social History Social History   Tobacco Use   Smoking status: Some Days    Current packs/day: 1.00    Average packs/day: 1 pack/day for 36.0 years (36.0 ttl pk-yrs)    Types: Cigarettes   Smokeless tobacco: Former    Types: Chew   Tobacco comments:    1/2 - 1 pack a day.  Started smoking age 64./3 a day  Vaping Use   Vaping status: Never Used  Substance Use Topics   Alcohol use: Not Currently    Alcohol/week: 2.0 standard drinks of  alcohol    Types: 2 Cans of beer per week    Comment: occasional   Drug use: Yes    Types: Cocaine, Marijuana, Heroin, LSD, "Crack" cocaine    Comment: 11/10/21  Pt reports drug use "last week"     Allergies   Chocolate   Review of Systems Review of Systems  Constitutional:  Negative for fatigue.  HENT:  Positive for hearing loss. Negative for congestion and ear pain.   Eyes:  Positive for visual disturbance.  Respiratory:  Negative for shortness of breath.   Cardiovascular:  Negative for chest pain and palpitations.  Gastrointestinal:  Negative for nausea and vomiting.  Neurological:  Positive for dizziness and headaches. Negative for tremors, seizures, syncope, facial asymmetry, speech difficulty, weakness, light-headedness and numbness.  Psychiatric/Behavioral:  Negative for confusion.      Physical Exam Triage Vital Signs ED Triage Vitals  Encounter Vitals Group     BP 04/17/23 1700 126/80     Systolic BP Percentile --      Diastolic BP Percentile --      Pulse Rate 04/17/23 1700 72     Resp 04/17/23 1700 16     Temp 04/17/23 1700 97.9 F (36.6 C)     Temp Source 04/17/23 1700 Oral     SpO2 04/17/23 1700 95 %     Weight 04/17/23 1659 190 lb (86.2 kg)     Height 04/17/23 1659 5\' 8"  (1.727 m)     Head Circumference --      Peak Flow --      Pain Score 04/17/23 1706 7     Pain Loc --      Pain Education --      Exclude from Growth Chart --    No data found.  Updated Vital Signs BP 126/80 (BP Location: Left Arm)   Pulse 72   Temp 97.9 F (36.6 C) (Oral)   Resp 16   Ht 5\' 8"  (1.727 m)   Wt 190 lb (86.2 kg)   SpO2 95%   BMI 28.89 kg/m      Physical Exam Vitals and nursing note reviewed.  Constitutional:      General: She is not in acute distress.    Appearance: Normal appearance. She is not ill-appearing or toxic-appearing.  HENT:     Head: Normocephalic and atraumatic.     Right Ear: Tympanic membrane, ear canal and external ear normal.     Left  Ear: Tympanic membrane, ear canal and external ear normal.     Nose: Nose normal.     Mouth/Throat:     Mouth: Mucous membranes are moist.     Pharynx: Oropharynx is clear.  Eyes:     General: No scleral icterus.       Right eye: No discharge.        Left eye: No discharge.     Extraocular Movements: Extraocular movements intact.     Conjunctiva/sclera: Conjunctivae normal.     Pupils: Pupils are equal, round, and reactive to light.  Cardiovascular:     Rate and Rhythm: Normal rate and regular rhythm.     Heart sounds: Normal heart sounds.  Pulmonary:     Effort: Pulmonary effort is normal. No respiratory distress.     Breath sounds: Wheezing present.  Musculoskeletal:     Cervical back: Neck supple.  Skin:    General: Skin is dry.  Neurological:     General: No focal deficit present.     Mental Status: She is alert and oriented to person, place, and time. Mental status is at baseline.     Cranial Nerves: No cranial nerve deficit (grossly intact).     Motor: No weakness.     Coordination: Coordination normal.     Gait: Gait normal.  Psychiatric:        Mood and Affect: Mood normal.        Behavior: Behavior normal.      UC Treatments / Results  Labs (all labs ordered are listed, but only abnormal results are displayed) Labs Reviewed  GLUCOSE, CAPILLARY - Abnormal; Notable for the following components:      Result Value   Glucose-Capillary 105 (*)    All other components within normal limits  CBG MONITORING, ED  Contains abnormal data CBC w/ Differential Order: [id number] Component Ref Range & Units 2 mo ago  WBC 3.6 - 11.2 10*9/L 10.9  RBC 4.11 - 5.36 10*12/L 4.62  HGB 12.1 - 15.7 g/dL 78.2  HCT 95.6 - 21.3 % 42.2  MCV 77.6 - 95.7 fL 91.4  MCH 25.9 - 32.4 pg 31.2  MCHC 32.0 - 36.0 g/dL 08.6  RDW 57.8 - 46.9 % 13  MPV 6.8 - 10.7 fL 7.7  Platelet 150 - 450 10*9/L 455 High   Neutrophils % % 84.2  Lymphocytes % % 8.8  Monocytes % % 6.2   Eosinophils % % 0.5  Basophils % % 0.3  Absolute Neutrophils 1.8 - 7.8 10*9/L 9.2 High   Absolute Lymphocytes 1.1 - 3.6 10*9/L 1 Low    Comprehensive Metabolic Panel Order: [id number] Component Ref Range & Units 2 mo ago  Sodium 135 - 145 mmol/L 137  Potassium 3.4 - 4.8 mmol/L 3.9  Chloride 98 - 107 mmol/L 98  CO2 20.0 - 31.0 mmol/L 29  Anion Gap 5 - 14 mmol/L 10  BUN 9 - 23 mg/dL 13  Creatinine 2.44 - 0.10 mg/dL 0.7 Low   Comment: Sex-specific reference interval. Interpret in the context of patient's hormone profile.  BUN/Creatinine Ratio 19  eGFR CKD-EPI (2021) Male >=60 mL/min/1.66m2 >90  Comment: eGFR calculated with CKD-EPI 2021 equation in accordance with [employer] and [employer] of Nephrology Task Force recommendations.  eGFR CKD-EPI (2021) Male >=60 mL/min/1.53m2 >90  Comment: eGFR calculated with CKD-EPI 2021 equation in accordance with [employer] and [employer] of Nephrology Task Force recommendations.  Glucose 70 - 179 mg/dL 272  Calcium 8.7 - 53.6 mg/dL 9.4  Albumin 3.4 - 5.0 g/dL 4.5  Total Protein 5.7 - 8.2 g/dL 7.8  Total Bilirubin 0.3 - 1.2 mg/dL 0.5  AST <=64 U/L 17  Comment: Sex-specific reference interval. Interpret in the context of patient's hormone profile.  ALT 10 - 49 U/L 16  Comment: Sex-specific reference interval. Interpret in the context of patient's hormone profile.  Alkaline Phosphatase 46 - 116 U/L 59  Comment: Sex-specific reference interval. Interpret in the context of patient's hormone profile.   EKG   Radiology No results found.  Procedures Procedures (including critical care time)  Medications Ordered in UC Medications  ketorolac (TORADOL) injection 30 mg (30 mg Intramuscular Given 04/17/23 1735)    Initial Impression / Assessment and Plan / UC Course  I have reviewed the triage vital signs and the nursing notes.  Pertinent labs & imaging results that were  available during my care of the patient were reviewed by me and considered in my medical decision making (see chart for details).   53 year old transgender male presents for evaluation of 69-month history of chronic daily headaches, dizziness, intermittent blurred vision.  Has been seen by ENT specialist and a brain MRI has been ordered.  Unable to have it done since she was not able to have an IV placed and has been advised to reschedule it.  Takes meclizine as needed for dizziness.  Does not take anything for headaches.  Currently has a headache that started around 3 PM and is affecting the left frontal region.  Vitals are normal and stable and the patient is overall well-appearing.  Normal HEENT exam.  Scattered wheezes throughout chest.  Patient has COPD.  Denies shortness of breath currently.  Normal neuroexam.  Reviewed previous labs from January of this year.  Also reviewed internal medicine office visit  note from last month and cardiology note from last month.  Fingerstick glucose is 105 today.  Patient given 30 mg IM ketorolac in clinic for acute pain relief after giving consent.  Sent oral ketorolac to pharmacy as needed for severe headaches.  Advised to continue meclizine.  Advised to contact radiology department and reschedule brain MRI as soon as possible.  Explained this will rule out a lot of very serious medical conditions.  Advised to request referral to neurology from PCP.  Likely has migraines and an inner ear problem.  Patient has had worsening of vision and is waiting on new glasses.  This could be contributing to some of her symptoms as well.  Thoroughly reviewed going to emergency department for any acute worsening of symptoms.   Final Clinical Impressions(s) / UC Diagnoses   Final diagnoses:  Chronic nonintractable headache, unspecified headache type  Dizziness  Visual disturbance     Discharge Instructions      -Your blood sugar was not really elevated today.   Continue to watch your carbohydrate intake and exercise.  Follow-up with your PCP for annual visit and routine lab work and request an A1c test. - Please reach back out to the radiology department and reschedule your brain MRI.  This test is very important to rule out any serious conditions which could be contributing to your headaches. - Continue to follow-up with ENT specialist. - You may need to be referred to neurology for your chronic daily headaches.  Your PCP should be able to do that. - If you have acute headaches you can try the medication I sent to the pharmacy for you and continue your meclizine.     ED Prescriptions     Medication Sig Dispense Auth. Provider   ketorolac (TORADOL) 10 MG tablet Take 1 tablet (10 mg total) by mouth every 6 (six) hours as needed for moderate pain (pain score 4-6). 20 tablet Gareth Morgan      PDMP not reviewed this encounter.   Shirlee Latch, PA-C 04/17/23 1744

## 2023-04-17 NOTE — ED Triage Notes (Signed)
 Pt c/o HA,dizziness & blurred vision x3 mon. States has been seen by ENT for the same issue & they ordered an MRI. Had appt on 4/2 for the MRI but wasn't done d/t unsuccessful IV placement. States is concerned about A1C. Fam hx of diabetes. Took own BS last wk at home & was 6.3.

## 2023-04-17 NOTE — Discharge Instructions (Signed)
-  Your blood sugar was not really elevated today.  Continue to watch your carbohydrate intake and exercise.  Follow-up with your PCP for annual visit and routine lab work and request an A1c test. - Please reach back out to the radiology department and reschedule your brain MRI.  This test is very important to rule out any serious conditions which could be contributing to your headaches. - Continue to follow-up with ENT specialist. - You may need to be referred to neurology for your chronic daily headaches.  Your PCP should be able to do that. - If you have acute headaches you can try the medication I sent to the pharmacy for you and continue your meclizine.

## 2023-04-19 NOTE — Procedures (Signed)
 [facility] [address] [place], [address]    Complete Pulmonary Function Testing Interpretation:  FINDINGS:   Forced vital capacity is normal.  FEV1 is 2.53 L which is 70% predicted and is myopathy decreased.  F1 FVC ratio was decreased.  Post bronchodilator there was no significant change in the FEV1.  This is based on spirometric criteria.  Total lung capacity is normal.  Residual volume is normal.  FRC is increased.  DLCO was moderately decreased.  IMPRESSION:    This pulmonary function studies suggestive of mild obstructive lung disease.  Reduction in DLCO may be related to underlying history of smoking.  Clinical correlation is recommended  Yevonne Pax, MD [facility] Pulmonary Critical Care Medicine Sleep Medicine

## 2023-04-24 LAB — PULMONARY FUNCTION TEST

## 2023-04-26 ENCOUNTER — Ambulatory Visit
Admission: RE | Admit: 2023-04-26 | Discharge: 2023-04-26 | Disposition: A | Discharge status: Home / Self Care | Source: Ambulatory Visit | Attending: Otolaryngology | Admitting: Otolaryngology

## 2023-04-26 DIAGNOSIS — H902 Conductive hearing loss, unspecified: Secondary | ICD-10-CM | POA: Diagnosis present

## 2023-04-26 MED ORDER — GADOBUTROL 1 MMOL/ML IV SOLN
7.5000 mL | Freq: Once | INTRAVENOUS | Status: AC | PRN
Start: 2023-04-26 — End: 2023-04-26
  Administered 2023-04-26: 7.5 mL via INTRAVENOUS

## 2023-05-10 ENCOUNTER — Ambulatory Visit (INDEPENDENT_AMBULATORY_CARE_PROVIDER_SITE_OTHER): Payer: 59 | Admitting: Physician Assistant

## 2023-05-10 ENCOUNTER — Encounter: Payer: Self-pay | Admitting: Physician Assistant

## 2023-05-10 VITALS — BP 123/78 | HR 75 | Temp 98.2°F | Resp 16 | Ht 68.0 in | Wt 195.0 lb

## 2023-05-10 DIAGNOSIS — E782 Mixed hyperlipidemia: Secondary | ICD-10-CM | POA: Diagnosis not present

## 2023-05-10 DIAGNOSIS — R42 Dizziness and giddiness: Secondary | ICD-10-CM

## 2023-05-10 DIAGNOSIS — R5383 Other fatigue: Secondary | ICD-10-CM

## 2023-05-10 DIAGNOSIS — Z1329 Encounter for screening for other suspected endocrine disorder: Secondary | ICD-10-CM | POA: Diagnosis not present

## 2023-05-10 DIAGNOSIS — R519 Headache, unspecified: Secondary | ICD-10-CM | POA: Diagnosis not present

## 2023-05-10 DIAGNOSIS — R7303 Prediabetes: Secondary | ICD-10-CM

## 2023-05-10 MED ORDER — TOPIRAMATE 25 MG PO TABS: 25.0000 mg | ORAL_TABLET | Freq: Every day | ORAL | 1 refills | Status: DC

## 2023-05-10 NOTE — Progress Notes (Signed)
 [facility] [address] [place], [place] [address]  Internal MEDICINE  Office Visit Note  Patient Name: Clifford Burgess  [id number]  [id number]  Date of Service: 05/10/2023  Chief Complaint  Patient presents with   Follow-up   Gastroesophageal Reflux   Quality Metric Gaps    Colonoscopy    HPI Pt is here for routine follow up -reports headaches almost every day since December. Went to ED in [place] and  had CT. Thought to be possible BPPV and given info for epley and meclizine . Also having ringing in ear so some concern for meniere's disease. -She was referred to ENT for vertigo and ringing and did have MRI. States MRI did not show anything and that she is supposed to go back to ENT on the 7th for follow up and possible hearing aids -went to urgent care for headache on 04/17/23 and given toradol  and had sugar checked--105 -Recently had eye checked and did get new glasses a few days ago but symptoms have not changed -When getting headaches initially would also have vomiting, this has improved some. But headaches still present. Denies waking up with the headaches -sometimes feels the room spinning -Clean since Feb but then was wondering if alcohol may be helpful so tried 1 tallboy and did not help/felt worse so has not had any further alcohol. -smoking 1/2-3/4ppd. At one point got down to 5cigs but had some family stress and went back up. -discussed neurology referral and trial of topamax  to help with headaches given MRI unrevealing thus far. Will Follow up with ENT  Current Medication: Outpatient Encounter Medications as of 05/10/2023  Medication Sig   albuterol  (VENTOLIN  HFA) 108 (90 Base) MCG/ACT inhaler TAKE 2 PUFFS BY MOUTH EVERY 6 HOURS AS NEEDED FOR WHEEZE OR SHORTNESS OF BREATH   aspirin EC 81 MG tablet Take 81 mg by mouth daily.   dicyclomine  (BENTYL ) 20 MG tablet Take 1 tablet (20 mg total) by mouth every 6 (six) hours.   dutasteride (AVODART) 0.5 MG capsule Take  0.5 mg by mouth daily.   estradiol  (ESTRACE ) 1 MG tablet Take 1 mg by mouth 2 (two) times daily.   estradiol  valerate (DELESTROGEN) 40 MG/ML injection Inject 40 mg into the muscle every 28 (twenty-eight) days. Patient gets this medication from [facility].   ipratropium-albuterol  (DUONEB) 0.5-2.5 (3) MG/3ML SOLN INHALE 3 ML (1 VIAL) BY NEBULIZER EVERY 6 HOURS AS NEEDED   ketorolac  (TORADOL ) 10 MG tablet Take 1 tablet (10 mg total) by mouth every 6 (six) hours as needed for moderate pain (pain score 4-6).   lactulose  (CHRONULAC ) 10 GM/15ML solution Take 30 mLs (20 g total) by mouth daily as needed for mild constipation.   meclizine  (ANTIVERT ) 25 MG tablet Take 1 tablet (25 mg total) by mouth 3 (three) times daily as needed for dizziness.   metoprolol tartrate (LOPRESSOR) 50 MG tablet Take 50 mg by mouth 2 (two) times daily.   mometasone -formoterol  (DULERA) 100-5 MCG/ACT AERO Inhale 2 puffs into the lungs 2 (two) times daily.   omeprazole  (PRILOSEC) 20 MG capsule TAKE 1 CAPSULE BY MOUTH EVERY DAY   ondansetron  (ZOFRAN -ODT) 4 MG disintegrating tablet Take 1 tablet (4 mg total) by mouth every 8 (eight) hours as needed for nausea or vomiting.   OXYGEN  Inhale 2 L into the lungs. At night pt uses 24 hrs from [facility]   progesterone  (PROMETRIUM ) 200 MG capsule SMARTSIG:200 Milligram(s) By Mouth Every Evening   rosuvastatin  (CRESTOR ) 10 MG tablet  TAKE 1 TABLET BY MOUTH EVERY DAY   spironolactone  (ALDACTONE ) 50 MG tablet Take 1 tablet (50 mg total) by mouth 2 (two) times daily.   Tiotropium Bromide Monohydrate  (SPIRIVA  RESPIMAT) 2.5 MCG/ACT AERS Inhale 2 puffs into the lungs daily.   topiramate  (TOPAMAX ) 25 MG tablet Take 1 tablet (25 mg total) by mouth daily.   No facility-administered encounter medications on file as of 05/10/2023.    Surgical History: Past Surgical History:  Procedure Laterality Date   HERNIA REPAIR      Medical History: Past Medical History:  Diagnosis Date    Asthma    COPD (chronic obstructive pulmonary disease) (HCC)    GERD (gastroesophageal reflux disease)     Family History: Family History  Problem Relation Age of Onset   Hypertension Father     Social History   Socioeconomic History   Marital status: Divorced    Spouse name: Not on file   Number of children: 2   Years of education: Not on file   Highest education level: 8th grade  Occupational History   Not on file  Tobacco Use   Smoking status: Some Days    Current packs/day: 1.00    Average packs/day: 1 pack/day for 36.0 years (36.0 ttl pk-yrs)    Types: Cigarettes   Smokeless tobacco: Former    Types: Chew   Tobacco comments:    1/2 - 1 pack a day.  Started smoking age 52./3 a day  Vaping Use   Vaping status: Never Used  Substance and Sexual Activity   Alcohol use: Not Currently    Alcohol/week: 2.0 standard drinks of alcohol    Types: 2 Cans of beer per week    Comment: occasional   Drug use: Yes    Types: Cocaine, Marijuana, Heroin, LSD, "Crack" cocaine    Comment: 11/10/21  Pt reports drug use "last week"   Sexual activity: Not Currently  Other Topics Concern   Not on file  Social History Narrative   Not on file   Social Drivers of Health   Financial Resource Strain: High Risk (11/27/2017)   Overall Financial Resource Strain (CARDIA)    Difficulty of Paying Living Expenses: Hard  Food Insecurity: Food Insecurity Present (11/27/2017)   Hunger Vital Sign    Worried About Running Out of Food in the Last Year: Often true    Ran Out of Food in the Last Year: Often true  Transportation Needs: No Transportation Needs (11/27/2017)   PRAPARE - Administrator, Civil Service (Medical): No    Lack of Transportation (Non-Medical): No  Physical Activity: Inactive (11/27/2017)   Exercise Vital Sign    Days of Exercise per Week: 0 days    Minutes of Exercise per Session: 0 min  Stress: No Stress Concern Present (11/27/2017)   [employer] of  Occupational Health - Occupational Stress Questionnaire    Feeling of Stress : Not at all  Social Connections: Unknown (11/27/2017)   Social Connection and Isolation Panel [NHANES]    Frequency of Communication with Friends and Family: Not on file    Frequency of Social Gatherings with Friends and Family: Not on file    Attends Religious Services: Never    Active Member of Clubs or Organizations: No    Attends Banker Meetings: Never    Marital Status: Divorced  Intimate Partner Violence: Unknown (11/27/2017)   Humiliation, Afraid, Rape, and Kick questionnaire    Fear of Current or Ex-Partner: Not on  file    Emotionally Abused: No    Physically Abused: No    Sexually Abused: No      Review of Systems  Constitutional:  Negative for chills, fatigue and unexpected weight change.  HENT:  Positive for postnasal drip. Negative for congestion, rhinorrhea, sneezing and sore throat.   Eyes:  Negative for redness.  Respiratory:  Negative for cough and chest tightness.   Cardiovascular:  Negative for chest pain and palpitations.  Gastrointestinal:  Negative for abdominal pain, constipation, diarrhea and vomiting.  Genitourinary:  Negative for dysuria and frequency.  Musculoskeletal:  Negative for arthralgias, back pain, joint swelling and neck pain.  Skin:  Negative for rash.  Neurological:  Positive for dizziness and headaches. Negative for tremors and numbness.  Hematological:  Negative for adenopathy. Does not bruise/bleed easily.  Psychiatric/Behavioral:  Negative for behavioral problems (Depression), sleep disturbance and suicidal ideas. The patient is not nervous/anxious.     Vital Signs: BP 123/78   Pulse 75   Temp 98.2 F (36.8 C)   Resp 16   Ht 5\' 8"  (1.727 m)   Wt 195 lb (88.5 kg)   SpO2 97%   BMI 29.65 kg/m    Physical Exam Vitals and nursing note reviewed.  Constitutional:      General: She is awake. She is not in acute distress.    Appearance: Normal  appearance. She is well-developed, well-groomed and overweight. She is not ill-appearing.     Interventions: Nasal cannula in place.  HENT:     Head: Normocephalic and atraumatic.  Eyes:     Pupils: Pupils are equal, round, and reactive to light.  Cardiovascular:     Rate and Rhythm: Normal rate and regular rhythm.     Heart sounds: Normal heart sounds. No murmur heard. Pulmonary:     Effort: Pulmonary effort is normal. No accessory muscle usage or respiratory distress.     Breath sounds: Normal air entry. Wheezing present. No rales.  Neurological:     Mental Status: She is alert and oriented to person, place, and time.  Psychiatric:        Mood and Affect: Mood normal.        Behavior: Behavior normal. Behavior is cooperative.        Assessment/Plan: 1. Chronic daily headache (Primary) Will start topamax  and titrate as needed. Discussed possible S/E of this. Will also refer to neurology given new onset headaches for 4 months. Call if any issues or go to ED if new or worsening symptoms - Ambulatory referral to Neurology - topiramate  (TOPAMAX ) 25 MG tablet; Take 1 tablet (25 mg total) by mouth daily.  Dispense: 30 tablet; Refill: 1  2. Vertigo Will check labs and will follow up with ENT, has meclizine  to use prn. - CBC w/Diff/Platelet - Comprehensive metabolic panel with GFR  3. Mixed hyperlipidemia - Lipid Panel With LDL/HDL Ratio  4. Thyroid  disorder screen - TSH + free T4  5. Prediabetes - Hgb A1C w/o eAG  6. Other fatigue - CBC w/Diff/Platelet - Comprehensive metabolic panel with GFR - TSH + free T4 - Hgb A1C w/o eAG   General Counseling: Gianlucca verbalizes understanding of the findings of todays visit and agrees with plan of treatment. I have discussed any further diagnostic evaluation that may be needed or ordered today. We also reviewed her medications today. she has been encouraged to call the office with any questions or concerns that should arise related to  todays visit.    Orders  Placed This Encounter  Procedures   CBC w/Diff/Platelet   Comprehensive metabolic panel with GFR   TSH + free T4   Hgb A1C w/o eAG   Lipid Panel With LDL/HDL Ratio   Ambulatory referral to Neurology    Meds ordered this encounter  Medications   topiramate  (TOPAMAX ) 25 MG tablet    Sig: Take 1 tablet (25 mg total) by mouth daily.    Dispense:  30 tablet    Refill:  1    This patient was seen by Taylor Favia, PA-C in collaboration with Dr. Verneta Gone as a part of collaborative care agreement.   Total time spent:30 Minutes Time spent includes review of chart, medications, test results, and follow up plan with the patient.      Dr Fozia M Khan Internal medicine

## 2023-05-11 ENCOUNTER — Telehealth: Payer: Self-pay | Admitting: Physician Assistant

## 2023-05-11 NOTE — Telephone Encounter (Signed)
 Neurology referral sent via [facility] to [facility].  Notified patient. Gave pt telephone# [phone]

## 2023-05-16 ENCOUNTER — Telehealth: Payer: Self-pay | Admitting: Physician Assistant

## 2023-05-16 ENCOUNTER — Other Ambulatory Visit: Payer: Self-pay | Admitting: Physician Assistant

## 2023-05-16 DIAGNOSIS — J4489 Other specified chronic obstructive pulmonary disease: Secondary | ICD-10-CM

## 2023-05-16 NOTE — Telephone Encounter (Signed)
 Neurology appointment 05/25/2023 @ [facility]

## 2023-05-21 ENCOUNTER — Other Ambulatory Visit
Admission: RE | Admit: 2023-05-21 | Discharge: 2023-05-21 | Disposition: A | Discharge status: Home / Self Care | Attending: Physician Assistant | Admitting: Physician Assistant

## 2023-05-21 DIAGNOSIS — E782 Mixed hyperlipidemia: Secondary | ICD-10-CM | POA: Insufficient documentation

## 2023-05-21 DIAGNOSIS — R42 Dizziness and giddiness: Secondary | ICD-10-CM | POA: Diagnosis present

## 2023-05-21 DIAGNOSIS — R5383 Other fatigue: Secondary | ICD-10-CM | POA: Diagnosis not present

## 2023-05-21 DIAGNOSIS — Z1329 Encounter for screening for other suspected endocrine disorder: Secondary | ICD-10-CM | POA: Insufficient documentation

## 2023-05-21 DIAGNOSIS — R7303 Prediabetes: Secondary | ICD-10-CM | POA: Insufficient documentation

## 2023-05-21 LAB — LIPID PANEL
Cholesterol: 135 mg/dL (ref 0–200)
HDL: 44 mg/dL (ref >40)
LDL Cholesterol: 74 mg/dL (ref 0–99)
Total CHOL/HDL Ratio: 3.1 ratio
Triglycerides: 85 mg/dL (ref <150)
VLDL: 17 mg/dL (ref 0–40)

## 2023-05-21 LAB — CBC WITH DIFFERENTIAL/PLATELET
Abs Immature Granulocytes: 0.01 10*3/uL (ref 0.00–0.07)
Basophils Absolute: 0 10*3/uL (ref 0.0–0.1)
Basophils Relative: 0 %
Eosinophils Absolute: 0.1 10*3/uL (ref 0.0–0.5)
Eosinophils Relative: 2 %
HCT: 44.9 % (ref 39.0–52.0)
Hemoglobin: 14.8 g/dL (ref 13.0–17.0)
Immature Granulocytes: 0 %
Lymphocytes Relative: 28 %
Lymphs Abs: 2 10*3/uL (ref 0.7–4.0)
MCH: 30.5 pg (ref 26.0–34.0)
MCHC: 33 g/dL (ref 30.0–36.0)
MCV: 92.4 fL (ref 80.0–100.0)
Monocytes Absolute: 0.6 10*3/uL (ref 0.1–1.0)
Monocytes Relative: 9 %
Neutro Abs: 4.5 10*3/uL (ref 1.7–7.7)
Neutrophils Relative %: 61 %
Platelets: 464 10*3/uL — ABNORMAL HIGH (ref 150–400)
RBC: 4.86 MIL/uL (ref 4.22–5.81)
RDW: 12.5 % (ref 11.5–15.5)
WBC: 7.3 10*3/uL (ref 4.0–10.5)
nRBC: 0 % (ref 0.0–0.2)

## 2023-05-21 LAB — COMPREHENSIVE METABOLIC PANEL WITH GFR
ALT: 13 U/L (ref 0–44)
AST: 15 U/L (ref 15–41)
Albumin: 4.1 g/dL (ref 3.5–5.0)
Alkaline Phosphatase: 53 U/L (ref 38–126)
Anion gap: 8 (ref 5–15)
BUN: 22 mg/dL — ABNORMAL HIGH (ref 6–20)
CO2: 25 mmol/L (ref 22–32)
Calcium: 9.2 mg/dL (ref 8.9–10.3)
Chloride: 105 mmol/L (ref 98–111)
Creatinine, Ser: 0.93 mg/dL (ref 0.61–1.24)
GFR, Estimated: >60 mL/min (ref >60)
Glucose, Bld: 117 mg/dL — ABNORMAL HIGH (ref 70–99)
Potassium: 4.5 mmol/L (ref 3.5–5.1)
Sodium: 138 mmol/L (ref 135–145)
Total Bilirubin: 0.5 mg/dL (ref 0.0–1.2)
Total Protein: 7.4 g/dL (ref 6.5–8.1)

## 2023-05-21 LAB — T4, FREE: Free T4: 0.57 ng/dL — ABNORMAL LOW (ref 0.61–1.12)

## 2023-05-21 LAB — HEMOGLOBIN A1C
Hgb A1c MFr Bld: 6.4 % — ABNORMAL HIGH (ref 4.8–5.6)
Mean Plasma Glucose: 137 mg/dL

## 2023-05-21 LAB — TSH: TSH: 2.447 u[IU]/mL (ref 0.350–4.500)

## 2023-05-28 ENCOUNTER — Other Ambulatory Visit: Payer: Self-pay

## 2023-05-28 DIAGNOSIS — K219 Gastro-esophageal reflux disease without esophagitis: Secondary | ICD-10-CM

## 2023-05-28 MED ORDER — OMEPRAZOLE 20 MG PO CPDR: 20.0000 mg | DELAYED_RELEASE_CAPSULE | Freq: Every day | ORAL | 2 refills | Status: DC

## 2023-06-08 ENCOUNTER — Encounter: Payer: Self-pay | Admitting: Physician Assistant

## 2023-06-08 ENCOUNTER — Ambulatory Visit (INDEPENDENT_AMBULATORY_CARE_PROVIDER_SITE_OTHER): Admitting: Physician Assistant

## 2023-06-08 VITALS — BP 112/70 | HR 80 | Temp 98.3°F | Resp 16 | Ht 68.0 in | Wt 195.2 lb

## 2023-06-08 DIAGNOSIS — R7989 Other specified abnormal findings of blood chemistry: Secondary | ICD-10-CM

## 2023-06-08 DIAGNOSIS — R519 Headache, unspecified: Secondary | ICD-10-CM | POA: Diagnosis not present

## 2023-06-08 DIAGNOSIS — R7303 Prediabetes: Secondary | ICD-10-CM

## 2023-06-08 MED ORDER — METFORMIN HCL 500 MG PO TABS: 500.0000 mg | ORAL_TABLET | Freq: Every day | ORAL | 1 refills | Status: AC

## 2023-06-08 NOTE — Progress Notes (Cosign Needed)
 [facility] [address] [place], [place] [address]  Internal MEDICINE  Office Visit Note  Patient Name: Clifford Burgess  [id number]  [id number]  Date of Service: 06/08/2023  Chief Complaint  Patient presents with   Follow-up   Gastroesophageal Reflux   Quality Metric Gaps    Colonoscopy    HPI Pt is here routine follow up -Seeing neurology now for daily headaches -They increased topamax  50mg  daily for a week then to 75mg  daily until she goes back on June 6th -labs reviewed--free t4 borderline low, A1c 6.4--eating bread and pasta, not sweets, but explained carbs raise this significantly and need to scale back/adjust diet. Platelets elevated. -will repeat labs for monitoring -would like to start medication to help bring BG down lower. Open to trying metformin   Current Medication: Outpatient Encounter Medications as of 06/08/2023  Medication Sig   albuterol  (VENTOLIN  HFA) 108 (90 Base) MCG/ACT inhaler TAKE 2 PUFFS BY MOUTH EVERY 6 HOURS AS NEEDED FOR WHEEZE OR SHORTNESS OF BREATH   aspirin EC 81 MG tablet Take 81 mg by mouth daily.   dicyclomine  (BENTYL ) 20 MG tablet Take 1 tablet (20 mg total) by mouth every 6 (six) hours.   dutasteride (AVODART) 0.5 MG capsule Take 0.5 mg by mouth daily.   estradiol  (ESTRACE ) 1 MG tablet Take 1 mg by mouth 2 (two) times daily.   estradiol  valerate (DELESTROGEN) 40 MG/ML injection Inject 40 mg into the muscle every 28 (twenty-eight) days. Patient gets this medication from [facility].   ipratropium-albuterol  (DUONEB) 0.5-2.5 (3) MG/3ML SOLN INHALE 3 ML (1 VIAL) BY NEBULIZER EVERY 6 HOURS AS NEEDED   ketorolac  (TORADOL ) 10 MG tablet Take 1 tablet (10 mg total) by mouth every 6 (six) hours as needed for moderate pain (pain score 4-6).   lactulose  (CHRONULAC ) 10 GM/15ML solution Take 30 mLs (20 g total) by mouth daily as needed for mild constipation.   meclizine  (ANTIVERT ) 25 MG tablet Take 1 tablet (25 mg total) by mouth 3  (three) times daily as needed for dizziness.   metFORMIN  (GLUCOPHAGE ) 500 MG tablet Take 1 tablet (500 mg total) by mouth daily with breakfast.   metoprolol tartrate (LOPRESSOR) 50 MG tablet Take 50 mg by mouth 2 (two) times daily.   mometasone -formoterol  (DULERA) 100-5 MCG/ACT AERO Inhale 2 puffs into the lungs 2 (two) times daily.   omeprazole  (PRILOSEC) 20 MG capsule Take 1 capsule (20 mg total) by mouth daily.   ondansetron  (ZOFRAN -ODT) 4 MG disintegrating tablet Take 1 tablet (4 mg total) by mouth every 8 (eight) hours as needed for nausea or vomiting.   OXYGEN  Inhale 2 L into the lungs. At night pt uses 24 hrs from [facility]   progesterone  (PROMETRIUM ) 200 MG capsule SMARTSIG:200 Milligram(s) By Mouth Every Evening   rosuvastatin  (CRESTOR ) 10 MG tablet TAKE 1 TABLET BY MOUTH EVERY DAY   spironolactone  (ALDACTONE ) 50 MG tablet Take 1 tablet (50 mg total) by mouth 2 (two) times daily.   Tiotropium Bromide Monohydrate  (SPIRIVA  RESPIMAT) 2.5 MCG/ACT AERS Inhale 2 puffs into the lungs daily.   topiramate  (TOPAMAX ) 25 MG tablet Take 1 tablet (25 mg total) by mouth daily.   No facility-administered encounter medications on file as of 06/08/2023.    Surgical History: Past Surgical History:  Procedure Laterality Date   HERNIA REPAIR      Medical History: Past Medical History:  Diagnosis Date   Asthma    COPD (chronic obstructive pulmonary disease) (HCC)    GERD (  gastroesophageal reflux disease)     Family History: Family History  Problem Relation Age of Onset   Hypertension Father     Social History   Socioeconomic History   Marital status: Divorced    Spouse name: Not on file   Number of children: 2   Years of education: Not on file   Highest education level: 8th grade  Occupational History   Not on file  Tobacco Use   Smoking status: Some Days    Current packs/day: 1.00    Average packs/day: 1 pack/day for 36.0 years (36.0 ttl pk-yrs)    Types: Cigarettes    Smokeless tobacco: Former    Types: Chew   Tobacco comments:    1/2 - 1 pack a day.  Started smoking age 50./3 a day  Vaping Use   Vaping status: Never Used  Substance and Sexual Activity   Alcohol use: Not Currently    Alcohol/week: 2.0 standard drinks of alcohol    Types: 2 Cans of beer per week    Comment: occasional   Drug use: Yes    Types: Cocaine, Marijuana, Heroin, LSD, Crack cocaine    Comment: 11/10/21  Pt reports drug use last week   Sexual activity: Not Currently  Other Topics Concern   Not on file  Social History Narrative   Not on file   Social Drivers of Health   Financial Resource Strain: Low Risk  (05/25/2023)   Received from [facility]   Overall Financial Resource Strain (CARDIA)    Difficulty of Paying Living Expenses: Not very hard  Food Insecurity: Food Insecurity Present (05/25/2023)   Received from [facility]   Hunger Vital Sign    Within the past 12 months, you worried that your food would run out before you got the money to buy more.: Often true    Within the past 12 months, the food you bought just didn't last and you didn't have money to get more.: Never true  Transportation Needs: No Transportation Needs (05/25/2023)   Received from [facility] - Transportation    In the past 12 months, has lack of transportation kept you from medical appointments or from getting medications?: No    Lack of Transportation (Non-Medical): No  Physical Activity: Inactive (11/27/2017)   Exercise Vital Sign    Days of Exercise per Week: 0 days    Minutes of Exercise per Session: 0 min  Stress: No Stress Concern Present (11/27/2017)   [employer] of Occupational Health - Occupational Stress Questionnaire    Feeling of Stress : Not at all  Social Connections: Unknown (11/27/2017)   Social Connection and Isolation Panel    Frequency of Communication with Friends and Family: Not on file     Frequency of Social Gatherings with Friends and Family: Not on file    Attends Religious Services: Never    Active Member of Clubs or Organizations: No    Attends Banker Meetings: Never    Marital Status: Divorced  Catering manager Violence: Unknown (11/27/2017)   Humiliation, Afraid, Rape, and Kick questionnaire    Fear of Current or Ex-Partner: Not on file    Emotionally Abused: No    Physically Abused: No    Sexually Abused: No      Review of Systems  Constitutional:  Negative for chills, fatigue and unexpected weight change.  HENT:  Positive for postnasal drip. Negative for congestion, rhinorrhea, sneezing and sore  throat.   Eyes:  Negative for redness.  Respiratory:  Negative for cough and chest tightness.   Cardiovascular:  Negative for chest pain and palpitations.  Gastrointestinal:  Negative for abdominal pain, constipation, diarrhea and vomiting.  Genitourinary:  Negative for dysuria and frequency.  Musculoskeletal:  Negative for arthralgias, back pain, joint swelling and neck pain.  Skin:  Negative for rash.  Neurological:  Positive for headaches. Negative for tremors and numbness.  Hematological:  Negative for adenopathy. Does not bruise/bleed easily.  Psychiatric/Behavioral:  Negative for behavioral problems (Depression), sleep disturbance and suicidal ideas. The patient is not nervous/anxious.     Vital Signs: BP 112/70   Pulse 80   Temp 98.3 F (36.8 C)   Resp 16   Ht 5' 8 (1.727 m)   Wt 195 lb 3.2 oz (88.5 kg)   SpO2 97%   BMI 29.68 kg/m    Physical Exam Vitals and nursing note reviewed.  Constitutional:      General: She is awake. She is not in acute distress.    Appearance: Normal appearance. She is well-developed, well-groomed and overweight. She is not ill-appearing.     Interventions: Nasal cannula in place.  HENT:     Head: Normocephalic and atraumatic.   Eyes:     Pupils: Pupils are equal, round, and reactive to light.     Cardiovascular:     Rate and Rhythm: Normal rate and regular rhythm.     Heart sounds: Normal heart sounds. No murmur heard. Pulmonary:     Effort: Pulmonary effort is normal. No accessory muscle usage or respiratory distress.     Breath sounds: Normal air entry. No rales.   Skin:    General: Skin is warm and dry.   Neurological:     Mental Status: She is alert and oriented to person, place, and time.   Psychiatric:        Mood and Affect: Mood normal.        Behavior: Behavior normal. Behavior is cooperative.        Assessment/Plan: 1. Chronic daily headache (Primary) Improving on topamax --increased by neurology and will follow up with neurology  2. Low serum thyroxine (T4) level Will recheck levels in 6 weeks - TSH + free T4  3. Elevated platelet count Will repeat for monitoring - CBC w/Diff/Platelet  4. Prediabetes Will start metformin  once daily - metFORMIN  (GLUCOPHAGE ) 500 MG tablet; Take 1 tablet (500 mg total) by mouth daily with breakfast.  Dispense: 90 tablet; Refill: 1   General Counseling: Sandra verbalizes understanding of the findings of todays visit and agrees with plan of treatment. I have discussed any further diagnostic evaluation that may be needed or ordered today. We also reviewed her medications today. she has been encouraged to call the office with any questions or concerns that should arise related to todays visit.    Orders Placed This Encounter  Procedures   TSH + free T4   CBC w/Diff/Platelet    Meds ordered this encounter  Medications   metFORMIN  (GLUCOPHAGE ) 500 MG tablet    Sig: Take 1 tablet (500 mg total) by mouth daily with breakfast.    Dispense:  90 tablet    Refill:  1    This patient was seen by Taylor Favia, PA-C in collaboration with Dr. Verneta Gone as a part of collaborative care agreement.   Total time spent:30 Minutes Time spent includes review of chart, medications, test results, and follow up plan with  the patient.  Dr Fozia M Khan Internal medicine

## 2023-06-12 ENCOUNTER — Other Ambulatory Visit: Payer: Self-pay

## 2023-06-12 ENCOUNTER — Telehealth: Payer: Self-pay

## 2023-06-12 ENCOUNTER — Other Ambulatory Visit: Payer: Self-pay | Admitting: Physician Assistant

## 2023-06-12 MED ORDER — DEXCOM G7 RECEIVER DEVI: 0 refills | Status: DC

## 2023-06-12 MED ORDER — DEXCOM G7 SENSOR MISC: 3 refills | Status: DC

## 2023-06-12 NOTE — Telephone Encounter (Signed)
 Pt sister advised we sent dexcom may not covered if not we can change to accu chek meter

## 2023-06-12 NOTE — Telephone Encounter (Signed)
 Completed P.A. for both Dexcom G7 Sensor and Receiver.

## 2023-06-14 ENCOUNTER — Telehealth: Payer: Self-pay

## 2023-06-14 NOTE — Telephone Encounter (Signed)
 Called patient's insurance to complete verbal PA for Dexcom G7. Insurance representative notified me that patient must call and inform them that [employer] Slim is her primary insurance. Notified patient, patient will call me once she gets in touch with them so that I can re-authorize Dexcom for her.

## 2023-07-20 ENCOUNTER — Other Ambulatory Visit: Payer: Self-pay | Admitting: Physician Assistant

## 2023-07-20 DIAGNOSIS — J4489 Other specified chronic obstructive pulmonary disease: Secondary | ICD-10-CM

## 2023-08-06 ENCOUNTER — Encounter: Payer: Self-pay | Admitting: Nurse Practitioner

## 2023-08-06 ENCOUNTER — Ambulatory Visit (INDEPENDENT_AMBULATORY_CARE_PROVIDER_SITE_OTHER): Payer: 59 | Admitting: Nurse Practitioner

## 2023-08-06 VITALS — BP 108/65 | HR 68 | Temp 98.0°F | Resp 16 | Ht 67.0 in | Wt 187.0 lb

## 2023-08-06 DIAGNOSIS — J4489 Other specified chronic obstructive pulmonary disease: Secondary | ICD-10-CM | POA: Insufficient documentation

## 2023-08-06 DIAGNOSIS — J9611 Chronic respiratory failure with hypoxia: Secondary | ICD-10-CM

## 2023-08-06 DIAGNOSIS — F17219 Nicotine dependence, cigarettes, with unspecified nicotine-induced disorders: Secondary | ICD-10-CM | POA: Diagnosis not present

## 2023-08-06 MED ORDER — ALBUTEROL SULFATE HFA 108 (90 BASE) MCG/ACT IN AERS: INHALATION_SPRAY | RESPIRATORY_TRACT | 3 refills | Status: AC

## 2023-08-06 MED ORDER — MOMETASONE FURO-FORMOTEROL FUM 100-5 MCG/ACT IN AERO
2.0000 | INHALATION_SPRAY | Freq: Two times a day (BID) | RESPIRATORY_TRACT | 11 refills | Status: AC
Start: 2023-08-06 — End: ?

## 2023-08-06 MED ORDER — SPIRIVA RESPIMAT 2.5 MCG/ACT IN AERS
2.0000 | INHALATION_SPRAY | Freq: Every day | RESPIRATORY_TRACT | 6 refills | Status: DC
Start: 2023-08-06 — End: 2023-11-22

## 2023-08-06 NOTE — Progress Notes (Signed)
 [facility] [address] [place], [place] [address]  Internal MEDICINE  Office Visit Note  Patient Name: Clifford Burgess  [id number]  [id number]  Date of Service: 08/06/2023  Chief Complaint  Patient presents with   Follow-up   COPD   Asthma    HPI [name] presents for a follow-up visit for COPD, chronic respiratory failure, on continuous oxygen  and refills.  SOB -- increased right now due to the heat outside making it worse.  Current smoker -about 1 ppd, had previously quit smoking but started back after a while.  Oxygen  use 2-3 LPM continuous, rode her scooter to the appt today.  Last PFT was in January -- mild obstructive lung disease and reduced DLCO. Has CT chest ordered for lung cancer screening. Needs to get this scheduled.     Current Medication: Outpatient Encounter Medications as of 08/06/2023  Medication Sig   aspirin EC 81 MG tablet Take 81 mg by mouth daily.   Continuous Glucose Receiver (DEXCOM G7 RECEIVER) DEVI Use as directed Dx E11.65   Continuous Glucose Sensor (DEXCOM G7 SENSOR) MISC USE AS DIRECTED EVERY 10 DAYS   dicyclomine  (BENTYL ) 20 MG tablet Take 1 tablet (20 mg total) by mouth every 6 (six) hours.   dutasteride (AVODART) 0.5 MG capsule Take 0.5 mg by mouth daily.   estradiol  (ESTRACE ) 1 MG tablet Take 1 mg by mouth 2 (two) times daily.   estradiol  valerate (DELESTROGEN) 40 MG/ML injection Inject 40 mg into the muscle every 28 (twenty-eight) days. Patient gets this medication from [facility].   ipratropium-albuterol  (DUONEB) 0.5-2.5 (3) MG/3ML SOLN INHALE 3 ML (1 VIAL) BY NEBULIZER EVERY 6 HOURS AS NEEDED   ketorolac  (TORADOL ) 10 MG tablet Take 1 tablet (10 mg total) by mouth every 6 (six) hours as needed for moderate pain (pain score 4-6).   lactulose  (CHRONULAC ) 10 GM/15ML solution Take 30 mLs (20 g total) by mouth daily as needed for mild constipation.   meclizine  (ANTIVERT ) 25 MG tablet Take 1 tablet (25 mg total) by mouth 3  (three) times daily as needed for dizziness.   metFORMIN  (GLUCOPHAGE ) 500 MG tablet Take 1 tablet (500 mg total) by mouth daily with breakfast.   metoprolol tartrate (LOPRESSOR) 50 MG tablet Take 50 mg by mouth 2 (two) times daily.   omeprazole  (PRILOSEC) 20 MG capsule Take 1 capsule (20 mg total) by mouth daily.   ondansetron  (ZOFRAN -ODT) 4 MG disintegrating tablet Take 1 tablet (4 mg total) by mouth every 8 (eight) hours as needed for nausea or vomiting.   OXYGEN  Inhale 2 L into the lungs. At night pt uses 24 hrs from [facility]   progesterone  (PROMETRIUM ) 200 MG capsule SMARTSIG:200 Milligram(s) By Mouth Every Evening   rosuvastatin  (CRESTOR ) 10 MG tablet TAKE 1 TABLET BY MOUTH EVERY DAY   spironolactone  (ALDACTONE ) 50 MG tablet Take 1 tablet (50 mg total) by mouth 2 (two) times daily.   topiramate  (TOPAMAX ) 25 MG tablet Take 1 tablet (25 mg total) by mouth daily.   [DISCONTINUED] albuterol  (VENTOLIN  HFA) 108 (90 Base) MCG/ACT inhaler TAKE 2 PUFFS BY MOUTH EVERY 6 HOURS AS NEEDED FOR WHEEZE OR SHORTNESS OF BREATH   [DISCONTINUED] mometasone -formoterol  (DULERA) 100-5 MCG/ACT AERO Inhale 2 puffs into the lungs 2 (two) times daily.   [DISCONTINUED] Tiotropium Bromide Monohydrate  (SPIRIVA  RESPIMAT) 2.5 MCG/ACT AERS Inhale 2 puffs into the lungs daily.   albuterol  (VENTOLIN  HFA) 108 (90 Base) MCG/ACT inhaler TAKE 2 PUFFS BY MOUTH EVERY 6 HOURS AS  NEEDED FOR WHEEZE OR SHORTNESS OF BREATH   mometasone -formoterol  (DULERA) 100-5 MCG/ACT AERO Inhale 2 puffs into the lungs 2 (two) times daily.   Tiotropium Bromide Monohydrate  (SPIRIVA  RESPIMAT) 2.5 MCG/ACT AERS Inhale 2 puffs into the lungs daily.   No facility-administered encounter medications on file as of 08/06/2023.    Surgical History: Past Surgical History:  Procedure Laterality Date   HERNIA REPAIR      Medical History: Past Medical History:  Diagnosis Date   Asthma    COPD (chronic obstructive pulmonary disease) (HCC)    GERD  (gastroesophageal reflux disease)     Family History: Family History  Problem Relation Age of Onset   Hypertension Father     Social History   Socioeconomic History   Marital status: Divorced    Spouse name: Not on file   Number of children: 2   Years of education: Not on file   Highest education level: 8th grade  Occupational History   Not on file  Tobacco Use   Smoking status: Some Days    Current packs/day: 1.00    Average packs/day: 1 pack/day for 36.0 years (36.0 ttl pk-yrs)    Types: Cigarettes   Smokeless tobacco: Former    Types: Chew   Tobacco comments:    1/2 - 1 pack a day.  Started smoking age 75./3 a day  Vaping Use   Vaping status: Never Used  Substance and Sexual Activity   Alcohol use: Not Currently    Alcohol/week: 2.0 standard drinks of alcohol    Types: 2 Cans of beer per week    Comment: occasional   Drug use: Yes    Types: Cocaine, Marijuana, Heroin, LSD, Crack cocaine    Comment: 11/10/21  Pt reports drug use last week   Sexual activity: Not Currently  Other Topics Concern   Not on file  Social History Narrative   Not on file   Social Drivers of Health   Financial Resource Strain: High Risk (08/02/2023)   Received from [facility]   Overall Financial Resource Strain (CARDIA)    Difficulty of Paying Living Expenses: Hard  Food Insecurity: Food Insecurity Present (08/02/2023)   Received from [facility]   Hunger Vital Sign    Within the past 12 months, you worried that your food would run out before you got the money to buy more.: Sometimes true    Within the past 12 months, the food you bought just didn't last and you didn't have money to get more.: Sometimes true  Transportation Needs: No Transportation Needs (08/02/2023)   Received from [facility] - Transportation    In the past 12 months, has lack of transportation kept you from medical appointments or from getting  medications?: No    Lack of Transportation (Non-Medical): No  Physical Activity: Insufficiently Active (08/02/2023)   Received from [facility]   Exercise Vital Sign    On average, how many days per week do you engage in moderate to strenuous exercise (like a brisk walk)?: 7 days    On average, how many minutes do you engage in exercise at this level?: 10 min  Stress: Stress Concern Present (08/02/2023)   Received from [facility] - Occupational Stress Questionnaire    Feeling of Stress : Very much  Social Connections: Unknown (08/02/2023)   Received from [facility]   Social  Connection and Isolation Panel    In a typical week, how many times do you talk on the phone with family, friends, or neighbors?: More than three times a week    How often do you get together with friends or relatives?: More than three times a week    Attends Religious Services: Not on file    Do you belong to any clubs or organizations such as church groups, unions, fraternal or athletic groups, or school groups?: No    How often do you attend meetings of the clubs or organizations you belong to?: Never    Are you married, widowed, divorced, separated, never married, or living with a partner?: Never married  Intimate Partner Violence: Unknown (11/27/2017)   Humiliation, Afraid, Rape, and Kick questionnaire    Fear of Current or Ex-Partner: Not on file    Emotionally Abused: No    Physically Abused: No    Sexually Abused: No      Review of Systems  Constitutional:  Negative for appetite change, chills, fatigue and unexpected weight change.  HENT:  Negative for congestion, rhinorrhea, sneezing and sore throat.   Respiratory:  Positive for cough (smoker's cough), chest tightness (intermittent), shortness of breath (using continuous oxygen . 2-3 LPM) and wheezing (intermittent).   Cardiovascular: Negative.  Negative for  chest pain and palpitations.  Gastrointestinal:  Negative for abdominal pain, constipation, diarrhea, nausea and vomiting.  Neurological: Negative.   Psychiatric/Behavioral:  Negative for behavioral problems (Depression), sleep disturbance and suicidal ideas. The patient is not nervous/anxious.     Vital Signs: BP 108/65   Pulse 68   Temp 98 F (36.7 C)   Resp 16   Ht 5' 7 (1.702 m)   Wt 187 lb (84.8 kg)   SpO2 98%   BMI 29.29 kg/m    Physical Exam Vitals reviewed.  Constitutional:      General: She is awake. She is not in acute distress.    Appearance: Normal appearance. She is well-developed, well-groomed and overweight. She is not ill-appearing.     Interventions: Nasal cannula in place.  HENT:     Head: Normocephalic and atraumatic.  Eyes:     Pupils: Pupils are equal, round, and reactive to light.  Cardiovascular:     Rate and Rhythm: Normal rate and regular rhythm.     Heart sounds: Normal heart sounds. No murmur heard. Pulmonary:     Effort: Pulmonary effort is normal. No accessory muscle usage or respiratory distress.     Breath sounds: Normal breath sounds and air entry. No wheezing or rales.  Neurological:     Mental Status: She is alert and oriented to person, place, and time.  Psychiatric:        Mood and Affect: Mood normal.        Behavior: Behavior normal. Behavior is cooperative.        Assessment/Plan: 1. Obstructive chronic bronchitis without exacerbation (HCC) (Primary) PFT ordered for January. Neb and inhaler refills ordered. CT chest ordered, needs to be scheduled.  - albuterol  (VENTOLIN  HFA) 108 (90 Base) MCG/ACT inhaler; TAKE 2 PUFFS BY MOUTH EVERY 6 HOURS AS NEEDED FOR WHEEZE OR SHORTNESS OF BREATH  Dispense: 18 each; Refill: 3 - mometasone -formoterol  (DULERA) 100-5 MCG/ACT AERO; Inhale 2 puffs into the lungs 2 (two) times daily.  Dispense: 13 g; Refill: 11 - Tiotropium Bromide Monohydrate  (SPIRIVA  RESPIMAT) 2.5 MCG/ACT AERS; Inhale 2 puffs  into the lungs daily.  Dispense: 4 g; Refill: 6 - Pulmonary function test;  Future  2. Chronic respiratory failure with hypoxia (HCC) PFT ordered for January. Neb and inhaler refills ordered  - albuterol  (VENTOLIN  HFA) 108 (90 Base) MCG/ACT inhaler; TAKE 2 PUFFS BY MOUTH EVERY 6 HOURS AS NEEDED FOR WHEEZE OR SHORTNESS OF BREATH  Dispense: 18 each; Refill: 3 - mometasone -formoterol  (DULERA) 100-5 MCG/ACT AERO; Inhale 2 puffs into the lungs 2 (two) times daily.  Dispense: 13 g; Refill: 11 - Tiotropium Bromide Monohydrate  (SPIRIVA  RESPIMAT) 2.5 MCG/ACT AERS; Inhale 2 puffs into the lungs daily.  Dispense: 4 g; Refill: 6 - Pulmonary function test; Future  3. 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 I have discussed any further diagnostic evaluation that may be needed or ordered today. We also reviewed her medications today. she has been encouraged to call the office with any questions or concerns that should arise related to todays visit.    No orders of the defined types were placed in this encounter.   Meds ordered this encounter  Medications   albuterol  (VENTOLIN  HFA) 108 (90 Base) MCG/ACT inhaler    Sig: TAKE 2 PUFFS BY MOUTH EVERY 6 HOURS AS NEEDED FOR WHEEZE OR SHORTNESS OF BREATH    Dispense:  18 each    Refill:  3   mometasone -formoterol  (DULERA) 100-5 MCG/ACT AERO    Sig: Inhale 2 puffs into the lungs 2 (two) times daily.    Dispense:  13 g    Refill:  11   Tiotropium Bromide Monohydrate  (SPIRIVA  RESPIMAT) 2.5 MCG/ACT AERS    Sig: Inhale 2 puffs into the lungs daily.    Dispense:  4 g    Refill:  6    Return in about 6 months (around 02/06/2024) for F/U, PFT @ [facility], pulmonary/sleep with DSK need PFT in 6 months w/ f/u after.   Total time spent:30 Minutes Time spent includes  review of chart, medications, test results, and follow up plan with the patient.   [place] Controlled Substance Database was reviewed by me.  This patient was seen by Mardy Maxin, FNP-C in collaboration with Dr. Sigrid Bathe as a part of collaborative care agreement.   Pieter Fooks R. Maxin, MSN, FNP-C Internal medicine

## 2023-08-08 LAB — CBC WITH DIFFERENTIAL/PLATELET
Basophils Absolute: 0 x10E3/uL (ref 0.0–0.2)
Basos: 1 %
EOS (ABSOLUTE): 0.1 x10E3/uL (ref 0.0–0.4)
Eos: 1 %
Hematocrit: 41.8 % (ref 37.5–51.0)
Hemoglobin: 13.7 g/dL (ref 13.0–17.7)
Immature Grans (Abs): 0 x10E3/uL (ref 0.0–0.1)
Immature Granulocytes: 0 %
Lymphocytes Absolute: 2.2 x10E3/uL (ref 0.7–3.1)
Lymphs: 26 %
MCH: 30.1 pg (ref 26.6–33.0)
MCHC: 32.8 g/dL (ref 31.5–35.7)
MCV: 92 fL (ref 79–97)
Monocytes Absolute: 0.7 x10E3/uL (ref 0.1–0.9)
Monocytes: 8 %
Neutrophils Absolute: 5.5 x10E3/uL (ref 1.4–7.0)
Neutrophils: 64 %
Platelets: 447 x10E3/uL (ref 150–450)
RBC: 4.55 x10E6/uL (ref 4.14–5.80)
RDW: 12.1 % (ref 11.6–15.4)
WBC: 8.5 x10E3/uL (ref 3.4–10.8)

## 2023-08-08 LAB — LIPID PANEL WITH LDL/HDL RATIO
Cholesterol, Total: 187 mg/dL (ref 100–199)
HDL: 39 mg/dL — ABNORMAL LOW (ref >39)
LDL Chol Calc (NIH): 127 mg/dL — ABNORMAL HIGH (ref 0–99)
LDL/HDL Ratio: 3.3 ratio (ref 0.0–3.6)
Triglycerides: 116 mg/dL (ref 0–149)
VLDL Cholesterol Cal: 21 mg/dL (ref 5–40)

## 2023-08-08 LAB — COMPREHENSIVE METABOLIC PANEL WITH GFR
ALT: 7 IU/L (ref 0–44)
AST: 9 IU/L (ref 0–40)
Albumin: 4.1 g/dL (ref 3.8–4.9)
Alkaline Phosphatase: 58 IU/L (ref 44–121)
BUN/Creatinine Ratio: 14 (ref 9–20)
BUN: 12 mg/dL (ref 6–24)
Bilirubin Total: <0.2 mg/dL (ref 0.0–1.2)
CO2: 22 mmol/L (ref 20–29)
Calcium: 9.5 mg/dL (ref 8.7–10.2)
Chloride: 104 mmol/L (ref 96–106)
Creatinine, Ser: 0.87 mg/dL (ref 0.76–1.27)
Globulin, Total: 2 g/dL (ref 1.5–4.5)
Glucose: 102 mg/dL — ABNORMAL HIGH (ref 70–99)
Potassium: 5 mmol/L (ref 3.5–5.2)
Sodium: 139 mmol/L (ref 134–144)
Total Protein: 6.1 g/dL (ref 6.0–8.5)
eGFR: 103 mL/min/1.73 (ref >59)

## 2023-08-08 LAB — HGB A1C W/O EAG: Hgb A1c MFr Bld: 6.2 % — ABNORMAL HIGH (ref 4.8–5.6)

## 2023-08-08 LAB — TSH+FREE T4
Free T4: 1.05 ng/dL (ref 0.82–1.77)
TSH: 2.83 u[IU]/mL (ref 0.450–4.500)

## 2023-08-14 ENCOUNTER — Ambulatory Visit: Payer: Self-pay | Admitting: Physician Assistant

## 2023-08-14 ENCOUNTER — Telehealth: Payer: Self-pay

## 2023-08-14 ENCOUNTER — Other Ambulatory Visit: Payer: Self-pay | Admitting: Physician Assistant

## 2023-08-14 NOTE — Telephone Encounter (Signed)
 Completed PA for patient's Dexcom G7 sensor/receiver.

## 2023-08-16 ENCOUNTER — Telehealth (INDEPENDENT_AMBULATORY_CARE_PROVIDER_SITE_OTHER): Admitting: Physician Assistant

## 2023-08-16 ENCOUNTER — Encounter: Payer: Self-pay | Admitting: Physician Assistant

## 2023-08-16 VITALS — Ht 68.0 in | Wt 198.0 lb

## 2023-08-16 DIAGNOSIS — R7989 Other specified abnormal findings of blood chemistry: Secondary | ICD-10-CM

## 2023-08-16 DIAGNOSIS — E782 Mixed hyperlipidemia: Secondary | ICD-10-CM

## 2023-08-16 DIAGNOSIS — R519 Headache, unspecified: Secondary | ICD-10-CM

## 2023-08-16 DIAGNOSIS — R7303 Prediabetes: Secondary | ICD-10-CM

## 2023-08-16 MED ORDER — ACCU-CHEK SOFTCLIX LANCETS MISC: 12 refills | Status: AC

## 2023-08-16 MED ORDER — ACCU-CHEK GUIDE ME W/DEVICE KIT: PACK | 0 refills | Status: AC

## 2023-08-16 MED ORDER — GLUCOSE BLOOD VI STRP: ORAL_STRIP | 12 refills | Status: AC

## 2023-08-16 NOTE — Progress Notes (Signed)
 [facility] [address] [place], [place] [address]  Internal MEDICINE  Telephone Visit  Patient Name: Clifford Burgess  [id number]  [id number]  Date of Service: 08/16/2023  I connected with the patient at 11:41 by telephone and verified the patients identity using two identifiers.   I discussed the limitations, risks, security and privacy concerns of performing an evaluation and management service by telephone and the availability of in person appointments. I also discussed with the patient that there may be a patient responsible charge related to the service.  The patient expressed understanding and agrees to proceed.    Chief Complaint  Patient presents with   Telephone Assessment   Telephone Screen    Labs follow up    Headache    Stress     HPI Pt is here for virtual follow up due to lack of transportation in poor weather -labs reviewed: Platelets normalized, thyroid  also normalized, A1c improving, cholesterol worse -tolerating metformin  now, a little rough at first but now doing well. Also changing diet and and reports her sister is helping as she is a diabetic. Trying to get CGM, but will also send glucose meter to have  -had stopped crestor  3 months ago and will restart -still working on quitting smoking -seeing a counselor now, had a video visit yesterday, has another in 2 weeks and reports this is helping manage stress  Current Medication: Outpatient Encounter Medications as of 08/16/2023  Medication Sig   Accu-Chek Softclix Lancets lancets Use as instructed   albuterol  (VENTOLIN  HFA) 108 (90 Base) MCG/ACT inhaler TAKE 2 PUFFS BY MOUTH EVERY 6 HOURS AS NEEDED FOR WHEEZE OR SHORTNESS OF BREATH   aspirin EC 81 MG tablet Take 81 mg by mouth daily.   Blood Glucose Monitoring Suppl (ACCU-CHEK GUIDE ME) w/Device KIT Use as directed. E11.65   Continuous Glucose Sensor (DEXCOM G7 SENSOR) MISC USE AS DIRECTED EVERY 10 DAYS   dicyclomine  (BENTYL ) 20 MG tablet Take 1  tablet (20 mg total) by mouth every 6 (six) hours.   dutasteride (AVODART) 0.5 MG capsule Take 0.5 mg by mouth daily.   estradiol  (ESTRACE ) 1 MG tablet Take 1 mg by mouth 2 (two) times daily.   estradiol  valerate (DELESTROGEN) 40 MG/ML injection Inject 40 mg into the muscle every 28 (twenty-eight) days. Patient gets this medication from [facility].   glucose blood test strip Use as instructed   ipratropium-albuterol  (DUONEB) 0.5-2.5 (3) MG/3ML SOLN INHALE 3 ML (1 VIAL) BY NEBULIZER EVERY 6 HOURS AS NEEDED   ketorolac  (TORADOL ) 10 MG tablet Take 1 tablet (10 mg total) by mouth every 6 (six) hours as needed for moderate pain (pain score 4-6).   lactulose  (CHRONULAC ) 10 GM/15ML solution Take 30 mLs (20 g total) by mouth daily as needed for mild constipation.   meclizine  (ANTIVERT ) 25 MG tablet Take 1 tablet (25 mg total) by mouth 3 (three) times daily as needed for dizziness.   metFORMIN  (GLUCOPHAGE ) 500 MG tablet Take 1 tablet (500 mg total) by mouth daily with breakfast.   metoprolol tartrate (LOPRESSOR) 50 MG tablet Take 50 mg by mouth 2 (two) times daily.   mometasone -formoterol  (DULERA) 100-5 MCG/ACT AERO Inhale 2 puffs into the lungs 2 (two) times daily.   omeprazole  (PRILOSEC) 20 MG capsule Take 1 capsule (20 mg total) by mouth daily.   ondansetron  (ZOFRAN -ODT) 4 MG disintegrating tablet Take 1 tablet (4 mg total) by mouth every 8 (eight) hours as needed for nausea or vomiting.  OXYGEN  Inhale 2 L into the lungs. At night pt uses 24 hrs from [facility]   progesterone  (PROMETRIUM ) 200 MG capsule SMARTSIG:200 Milligram(s) By Mouth Every Evening   rosuvastatin  (CRESTOR ) 10 MG tablet TAKE 1 TABLET BY MOUTH EVERY DAY   spironolactone  (ALDACTONE ) 50 MG tablet Take 1 tablet (50 mg total) by mouth 2 (two) times daily.   Tiotropium Bromide Monohydrate  (SPIRIVA  RESPIMAT) 2.5 MCG/ACT AERS Inhale 2 puffs into the lungs daily.   topiramate  (TOPAMAX ) 25 MG tablet Take 1 tablet (25 mg total) by  mouth daily.   [DISCONTINUED] Continuous Glucose Receiver (DEXCOM G7 RECEIVER) DEVI Use as directed Dx E11.65   No facility-administered encounter medications on file as of 08/16/2023.    Surgical History: Past Surgical History:  Procedure Laterality Date   HERNIA REPAIR      Medical History: Past Medical History:  Diagnosis Date   Asthma    COPD (chronic obstructive pulmonary disease) (HCC)    GERD (gastroesophageal reflux disease)     Family History: Family History  Problem Relation Age of Onset   Hypertension Father     Social History   Socioeconomic History   Marital status: Divorced    Spouse name: Not on file   Number of children: 2   Years of education: Not on file   Highest education level: 8th grade  Occupational History   Not on file  Tobacco Use   Smoking status: Some Days    Current packs/day: 1.00    Average packs/day: 1 pack/day for 36.0 years (36.0 ttl pk-yrs)    Types: Cigarettes   Smokeless tobacco: Former    Types: Chew   Tobacco comments:    1/2 - 1 pack a day.  Started smoking age 66./3 a day  Vaping Use   Vaping status: Never Used  Substance and Sexual Activity   Alcohol use: Not Currently    Alcohol/week: 2.0 standard drinks of alcohol    Types: 2 Cans of beer per week    Comment: occasional   Drug use: Yes    Types: Cocaine, Marijuana, Heroin, LSD, Crack cocaine    Comment: 11/10/21  Pt reports drug use last week   Sexual activity: Not Currently  Other Topics Concern   Not on file  Social History Narrative   Not on file   Social Drivers of Health   Financial Resource Strain: High Risk (08/02/2023)   Received from [facility]   Overall Financial Resource Strain (CARDIA)    Difficulty of Paying Living Expenses: Hard  Food Insecurity: Food Insecurity Present (08/02/2023)   Received from [facility]   Hunger Vital Sign    Within the past 12 months, you worried that your food would run out  before you got the money to buy more.: Sometimes true    Within the past 12 months, the food you bought just didn't last and you didn't have money to get more.: Sometimes true  Transportation Needs: No Transportation Needs (08/02/2023)   Received from [facility] - Transportation    In the past 12 months, has lack of transportation kept you from medical appointments or from getting medications?: No    Lack of Transportation (Non-Medical): No  Physical Activity: Insufficiently Active (08/02/2023)   Received from [facility]   Exercise Vital Sign    On average, how many days per week do you engage in moderate to strenuous exercise (like a brisk walk)?:  7 days    On average, how many minutes do you engage in exercise at this level?: 10 min  Stress: Stress Concern Present (08/02/2023)   Received from [facility] - Occupational Stress Questionnaire    Feeling of Stress : Very much  Social Connections: Unknown (08/02/2023)   Received from [facility]   Social Connection and Isolation Panel    In a typical week, how many times do you talk on the phone with family, friends, or neighbors?: More than three times a week    How often do you get together with friends or relatives?: More than three times a week    Attends Religious Services: Not on file    Do you belong to any clubs or organizations such as church groups, unions, fraternal or athletic groups, or school groups?: No    How often do you attend meetings of the clubs or organizations you belong to?: Never    Are you married, widowed, divorced, separated, never married, or living with a partner?: Never married  Intimate Partner Violence: Unknown (11/27/2017)   Humiliation, Afraid, Rape, and Kick questionnaire    Fear of Current or Ex-Partner: Not on file    Emotionally Abused: No    Physically Abused: No    Sexually  Abused: No      Review of Systems  Constitutional:  Negative for chills, fatigue and unexpected weight change.  HENT:  Positive for postnasal drip. Negative for congestion, rhinorrhea, sneezing and sore throat.   Eyes:  Negative for redness.  Respiratory:  Negative for cough and chest tightness.   Cardiovascular:  Negative for chest pain and palpitations.  Gastrointestinal:  Negative for abdominal pain, constipation, diarrhea and vomiting.  Genitourinary:  Negative for dysuria and frequency.  Musculoskeletal:  Negative for arthralgias, back pain, joint swelling and neck pain.  Skin:  Negative for rash.  Neurological:  Positive for headaches. Negative for tremors and numbness.  Hematological:  Negative for adenopathy. Does not bruise/bleed easily.  Psychiatric/Behavioral:  Negative for behavioral problems (Depression), sleep disturbance and suicidal ideas. The patient is not nervous/anxious.     Vital Signs: Ht 5' 8 (1.727 m)   Wt 198 lb (89.8 kg)   BMI 30.11 kg/m    Observation/Objective:  Pt is able to participate in conversation   Assessment/Plan: 1. Prediabetes (Primary) Improving, will send glucose monitor, continue metformin  and diet changes.  - Blood Glucose Monitoring Suppl (ACCU-CHEK GUIDE ME) w/Device KIT; Use as directed. E11.65  Dispense: 1 kit; Refill: 0 - Accu-Chek Softclix Lancets lancets; Use as instructed  Dispense: 100 each; Refill: 12 - glucose blood test strip; Use as instructed  Dispense: 100 each; Refill: 12  2. Low serum thyroxine (T4) level Improved, continue to monitor  3. Mixed hyperlipidemia Worsening and had stopped crestor , will restart this now  4. Chronic daily headache Followed by neurology   General Counseling: [place] oakland understanding of the findings of today's phone visit and agrees with plan of treatment. I have discussed any further diagnostic evaluation that may be needed or ordered today. We also reviewed her medications  today. she has been encouraged to call the office with any questions or concerns that should arise related to todays visit.    No orders of the defined types were placed in this encounter.   Meds ordered this encounter  Medications   Blood Glucose Monitoring Suppl (ACCU-CHEK GUIDE ME) w/Device KIT    Sig:  Use as directed. E11.65    Dispense:  1 kit    Refill:  0   Accu-Chek Softclix Lancets lancets    Sig: Use as instructed    Dispense:  100 each    Refill:  12   glucose blood test strip    Sig: Use as instructed    Dispense:  100 each    Refill:  12    Time spent:30 Minutes    Dr Sigrid CHRISTELLA Bathe Internal medicine

## 2023-09-17 ENCOUNTER — Other Ambulatory Visit: Payer: Self-pay | Admitting: Physician Assistant

## 2023-10-08 ENCOUNTER — Other Ambulatory Visit: Payer: Self-pay | Admitting: Physician Assistant

## 2023-10-08 ENCOUNTER — Other Ambulatory Visit: Payer: Self-pay | Admitting: Nurse Practitioner

## 2023-10-08 DIAGNOSIS — K219 Gastro-esophageal reflux disease without esophagitis: Secondary | ICD-10-CM

## 2023-10-08 DIAGNOSIS — R42 Dizziness and giddiness: Secondary | ICD-10-CM

## 2023-10-26 ENCOUNTER — Other Ambulatory Visit: Payer: Self-pay | Admitting: Physician Assistant

## 2023-11-06 ENCOUNTER — Telehealth: Payer: Self-pay | Admitting: Physician Assistant

## 2023-11-06 NOTE — Telephone Encounter (Addendum)
 Left vm to confirm 11/12/23 appointment-Toni

## 2023-11-12 ENCOUNTER — Encounter: Payer: 59 | Admitting: Physician Assistant

## 2023-11-17 ENCOUNTER — Other Ambulatory Visit: Payer: Self-pay

## 2023-11-17 ENCOUNTER — Emergency Department
Admission: EM | Admit: 2023-11-17 | Discharge: 2023-11-18 | Disposition: A | Attending: Emergency Medicine | Admitting: Emergency Medicine

## 2023-11-17 DIAGNOSIS — Z7951 Long term (current) use of inhaled steroids: Secondary | ICD-10-CM | POA: Diagnosis not present

## 2023-11-17 DIAGNOSIS — F17210 Nicotine dependence, cigarettes, uncomplicated: Secondary | ICD-10-CM | POA: Diagnosis not present

## 2023-11-17 DIAGNOSIS — R45851 Suicidal ideations: Secondary | ICD-10-CM | POA: Insufficient documentation

## 2023-11-17 DIAGNOSIS — F39 Unspecified mood [affective] disorder: Secondary | ICD-10-CM

## 2023-11-17 DIAGNOSIS — J449 Chronic obstructive pulmonary disease, unspecified: Secondary | ICD-10-CM | POA: Insufficient documentation

## 2023-11-17 LAB — COMPREHENSIVE METABOLIC PANEL WITH GFR
ALT: 10 U/L (ref 0–44)
AST: 15 U/L (ref 15–41)
Albumin: 3.9 g/dL (ref 3.5–5.0)
Alkaline Phosphatase: 54 U/L (ref 38–126)
Anion gap: 13 (ref 5–15)
BUN: 14 mg/dL (ref 6–20)
CO2: 25 mmol/L (ref 22–32)
Calcium: 9.2 mg/dL (ref 8.9–10.3)
Chloride: 101 mmol/L (ref 98–111)
Creatinine, Ser: 1.1 mg/dL (ref 0.61–1.24)
GFR, Estimated: >60 mL/min (ref >60)
Glucose, Bld: 115 mg/dL — ABNORMAL HIGH (ref 70–99)
Potassium: 4 mmol/L (ref 3.5–5.1)
Sodium: 139 mmol/L (ref 135–145)
Total Bilirubin: 0.6 mg/dL (ref 0.0–1.2)
Total Protein: 7.7 g/dL (ref 6.5–8.1)

## 2023-11-17 LAB — CBC
HCT: 43.5 % (ref 39.0–52.0)
Hemoglobin: 14 g/dL (ref 13.0–17.0)
MCH: 30.2 pg (ref 26.0–34.0)
MCHC: 32.2 g/dL (ref 30.0–36.0)
MCV: 94 fL (ref 80.0–100.0)
Platelets: 429 K/uL — ABNORMAL HIGH (ref 150–400)
RBC: 4.63 MIL/uL (ref 4.22–5.81)
RDW: 12.7 % (ref 11.5–15.5)
WBC: 9.7 K/uL (ref 4.0–10.5)
nRBC: 0 % (ref 0.0–0.2)

## 2023-11-17 LAB — ETHANOL: Alcohol, Ethyl (B): <15 mg/dL (ref <15)

## 2023-11-17 MED ORDER — FLUTICASONE FUROATE-VILANTEROL 100-25 MCG/ACT IN AEPB
1.0000 | INHALATION_SPRAY | Freq: Every day | RESPIRATORY_TRACT | Status: DC
  Filled 2023-11-17: qty 28

## 2023-11-17 MED ORDER — METFORMIN HCL 500 MG PO TABS
500.0000 mg | ORAL_TABLET | Freq: Every day | ORAL | Status: DC
  Filled 2023-11-17: qty 1

## 2023-11-17 MED ORDER — ROSUVASTATIN CALCIUM 10 MG PO TABS
10.0000 mg | ORAL_TABLET | Freq: Every day | ORAL | Status: DC
  Administered 2023-11-18: 10 mg via ORAL
  Filled 2023-11-17: qty 1

## 2023-11-17 MED ORDER — PANTOPRAZOLE SODIUM 40 MG PO TBEC
40.0000 mg | DELAYED_RELEASE_TABLET | Freq: Every day | ORAL | Status: DC
  Administered 2023-11-18: 40 mg via ORAL
  Filled 2023-11-17: qty 1

## 2023-11-17 MED ORDER — TIOTROPIUM BROMIDE 2.5 MCG/ACT IN AERS: 2.0000 | INHALATION_SPRAY | Freq: Every day | RESPIRATORY_TRACT | Status: DC

## 2023-11-17 MED ORDER — ESTRADIOL 0.5 MG PO TABS
1.0000 mg | ORAL_TABLET | Freq: Two times a day (BID) | ORAL | Status: DC
  Administered 2023-11-18: 1 mg via ORAL
  Filled 2023-11-17: qty 2

## 2023-11-17 MED ORDER — SPIRONOLACTONE 25 MG PO TABS: 50.0000 mg | ORAL_TABLET | Freq: Two times a day (BID) | ORAL | Status: DC

## 2023-11-17 MED ORDER — METOPROLOL TARTRATE 50 MG PO TABS: 50.0000 mg | ORAL_TABLET | Freq: Two times a day (BID) | ORAL | Status: DC

## 2023-11-17 MED ORDER — DUTASTERIDE 0.5 MG PO CAPS
0.5000 mg | ORAL_CAPSULE | Freq: Every day | ORAL | Status: DC
  Administered 2023-11-18: 0.5 mg via ORAL
  Filled 2023-11-17: qty 1

## 2023-11-17 MED ORDER — ASPIRIN 81 MG PO TBEC: 81.0000 mg | DELAYED_RELEASE_TABLET | Freq: Every day | ORAL | Status: DC

## 2023-11-17 MED ORDER — NICOTINE 21 MG/24HR TD PT24
21.0000 mg | MEDICATED_PATCH | Freq: Every day | TRANSDERMAL | Status: DC
  Administered 2023-11-18: 21 mg via TRANSDERMAL
  Filled 2023-11-17: qty 1

## 2023-11-17 MED ORDER — ALBUTEROL SULFATE (2.5 MG/3ML) 0.083% IN NEBU
2.5000 mg | INHALATION_SOLUTION | RESPIRATORY_TRACT | Status: DC | PRN
Start: 2023-11-17 — End: 2023-11-18

## 2023-11-17 MED ORDER — TOPIRAMATE 25 MG PO TABS: 25.0000 mg | ORAL_TABLET | Freq: Every day | ORAL | Status: DC

## 2023-11-17 NOTE — ED Triage Notes (Addendum)
 Pt reports suicidal ideation after a lot of stuff has been occurring in her life.SI started today with plan to OD on her medications. Denies ingestion, EOTH, or drugs. Is cooperative with triage staff

## 2023-11-17 NOTE — ED Notes (Signed)
 Single belongings bag taken with pt when going to room 26. Given to primary RN and placed at nursing station.

## 2023-11-17 NOTE — ED Notes (Addendum)
 Pt dressed out into hospital scrubs witnessed by this RN and NT Sage.   Pts belongings removed from pt include: blue tank top, black leggings, pink/white/blue beaded bracelet, purple hair tie, purple socks, purple/gray sneakers, tan underwear, and gray sports bra. Along with purple iphone. Placed in 1 belongings bag.   Belongings that remain with pt: pacifier, Eye glasses ,and left ear hearing aid.

## 2023-11-17 NOTE — ED Provider Notes (Incomplete)
 [facility] Provider Note    Event Date/Time   First MD Initiated Contact with Patient 11/17/23 2302     (approximate)   History   Suicidal   HPI  Clifford Burgess is a 53 y.o. adult with history of prediabetes, migraines, asthma, COPD who presents to the emergency department with suicidal thoughts.  Denies any plan at this time.  No HI or hallucinations.  No drug or alcohol use.  No prior suicide attempt or psychiatric admission.   History provided by patient.    Past Medical History:  Diagnosis Date   Asthma    COPD (chronic obstructive pulmonary disease) (HCC)    GERD (gastroesophageal reflux disease)     Past Surgical History:  Procedure Laterality Date   HERNIA REPAIR      MEDICATIONS:  Prior to Admission medications   Medication Sig Start Date End Date Taking? Authorizing Provider  Accu-Chek Softclix Lancets lancets Use as instructed 08/16/23   McDonough, Lauren K, PA-C  albuterol  (VENTOLIN  HFA) 108 (90 Base) MCG/ACT inhaler TAKE 2 PUFFS BY MOUTH EVERY 6 HOURS AS NEEDED FOR WHEEZE OR SHORTNESS OF BREATH 08/06/23   Liana Fish, NP  aspirin EC 81 MG tablet Take 81 mg by mouth daily. 01/09/19   [provider]  Blood Glucose Monitoring Suppl (ACCU-CHEK GUIDE ME) w/Device KIT Use as directed. E11.65 08/16/23   McDonough, Tinnie POUR, PA-C  Continuous Glucose Sensor (DEXCOM G7 SENSOR) MISC USE AS DIRECTED EVERY 10 DAYS 06/12/23   McDonough, Tinnie POUR, PA-C  dicyclomine  (BENTYL ) 20 MG tablet Take 1 tablet (20 mg total) by mouth every 6 (six) hours. 03/28/22   Sung, Jade J, MD  dutasteride (AVODART) 0.5 MG capsule Take 0.5 mg by mouth daily. 04/13/23   [provider]  estradiol  (ESTRACE ) 1 MG tablet Take 1 mg by mouth 2 (two) times daily. 02/23/23   [provider]  estradiol  valerate (DELESTROGEN) 40 MG/ML injection Inject 40 mg into the muscle every 28 (twenty-eight) days. Patient gets this medication from [facility]. 07/20/22   [provider]  glucose blood test strip Use as instructed 08/16/23   McDonough, Lauren K, PA-C  ipratropium-albuterol  (DUONEB) 0.5-2.5 (3) MG/3ML SOLN INHALE 3 ML (1 VIAL) BY NEBULIZER EVERY 6 HOURS AS NEEDED 07/20/23   McDonough, Lauren K, PA-C  ketorolac  (TORADOL ) 10 MG tablet Take 1 tablet (10 mg total) by mouth every 6 (six) hours as needed for moderate pain (pain score 4-6). 04/17/23   Arvis Jolan NOVAK, PA-C  lactulose  (CHRONULAC ) 10 GM/15ML solution Take 30 mLs (20 g total) by mouth daily as needed for mild constipation. 03/28/22   Sung, Jade J, MD  meclizine  (ANTIVERT ) 25 MG tablet TAKE 1 TABLET BY MOUTH 3 TIMES DAILY AS NEEDED FOR DIZZINESS. 10/08/23   McDonough, Lauren K, PA-C  metFORMIN  (GLUCOPHAGE ) 500 MG tablet Take 1 tablet (500 mg total) by mouth daily with breakfast. 06/08/23   McDonough, Lauren K, PA-C  metoprolol tartrate (LOPRESSOR) 50 MG tablet Take 50 mg by mouth 2 (two) times daily. 01/24/23   [provider]  mometasone -formoterol  (DULERA) 100-5 MCG/ACT AERO Inhale 2 puffs into the lungs 2 (two) times daily. 08/06/23   Liana Fish, NP  omeprazole  (PRILOSEC) 20 MG capsule TAKE 1 CAPSULE BY MOUTH EVERY DAY 10/08/23   McDonough, Lauren K, PA-C  ondansetron  (ZOFRAN -ODT) 4 MG disintegrating tablet Take 1 tablet (4 mg total) by mouth every 8 (eight) hours as needed for nausea or vomiting. 03/28/22  Sung, Jade J, MD  OXYGEN  Inhale 2 L into the lungs. At night pt uses 24 hrs from [facility]    [provider]  progesterone  (PROMETRIUM ) 200 MG capsule SMARTSIG:200 Milligram(s) By Mouth Every Evening 11/07/22   [provider]  rosuvastatin  (CRESTOR ) 10 MG tablet TAKE 1 TABLET BY MOUTH EVERY DAY 04/09/23   McDonough, Lauren K, PA-C  spironolactone  (ALDACTONE ) 50 MG tablet Take 1 tablet (50 mg total) by mouth 2 (two) times daily. 10/04/21   Khan, Fozia M, MD  Tiotropium Bromide Monohydrate  (SPIRIVA  RESPIMAT) 2.5 MCG/ACT AERS Inhale 2  puffs into the lungs daily. 08/06/23   Liana Fish, NP  topiramate  (TOPAMAX ) 25 MG tablet Take 1 tablet (25 mg total) by mouth daily. 05/10/23   Kristina Tinnie POUR, PA-C    Physical Exam   Triage Vital Signs: ED Triage Vitals  Encounter Vitals Group     BP 11/17/23 2105 132/80     Girls Systolic BP Percentile --      Girls Diastolic BP Percentile --      Boys Systolic BP Percentile --      Boys Diastolic BP Percentile --      Pulse Rate 11/17/23 2105 78     Resp 11/17/23 2105 16     Temp 11/17/23 2105 99.3 F (37.4 C)     Temp Source 11/17/23 2105 Oral     SpO2 11/17/23 2105 99 %     Weight --      Height --      Head Circumference --      Peak Flow --      Pain Score 11/17/23 2103 0     Pain Loc --      Pain Education --      Exclude from Growth Chart --     Most recent vital signs: Vitals:   11/17/23 2105  BP: 132/80  Pulse: 78  Resp: 16  Temp: 99.3 F (37.4 C)  SpO2: 99%    CONSTITUTIONAL: Alert, responds appropriately to questions. Well-appearing; well-nourished HEAD: Normocephalic, atraumatic EYES: Conjunctivae clear, pupils appear equal, sclera nonicteric ENT: normal nose; moist mucous membranes NECK: Supple, normal ROM CARD: RRR; S1 and S2 appreciated RESP: Normal chest excursion without splinting or tachypnea; breath sounds clear and equal bilaterally; no wheezes, no rhonchi, no rales, no hypoxia or respiratory distress, speaking full sentences ABD/GI: Non-distended; soft, non-tender, no rebound, no guarding, no peritoneal signs BACK: The back appears normal EXT: Normal ROM in all joints; no deformity noted, no edema SKIN: Normal color for age and race; warm; no rash on exposed skin NEURO: Moves all extremities equally, normal speech PSYCH: Flat affect.  Appears withdrawn.  Admits to SI.  No HI.  Not responding to internal stimuli.   ED Results / Procedures / Treatments   LABS: (all labs ordered are listed, but only abnormal results are  displayed) Labs Reviewed  COMPREHENSIVE METABOLIC PANEL WITH GFR - Abnormal; Notable for the following components:      Result Value   Glucose, Bld 115 (*)    All other components within normal limits  CBC - Abnormal; Notable for the following components:   Platelets 429 (*)    All other components within normal limits  ETHANOL  URINE DRUG SCREEN, QUALITATIVE ([facility] ONLY)  ACETAMINOPHEN  LEVEL  SALICYLATE LEVEL     EKG:  EKG Interpretation Date/Time:    Ventricular Rate:    PR Interval:    QRS Duration:    QT  Interval:    QTC Calculation:   R Axis:      Text Interpretation:           RADIOLOGY: My personal review and interpretation of imaging:    I have personally reviewed all radiology reports.   No results found.   PROCEDURES:  Critical Care performed: No     Procedures    IMPRESSION / MDM / ASSESSMENT AND PLAN / ED COURSE  I reviewed the triage vital signs and the nursing notes.    Patient here with suicidal thoughts.    DIFFERENTIAL DIAGNOSIS (includes but not limited to):   Suicidal thoughts, depression, anxiety, bipolar disorder   Patient's presentation is most consistent with acute presentation with potential threat to life or bodily function.   PLAN: Will obtain screening labs, urine.  Will consult psychiatry and TTS.  Patient is voluntary at this time.  The patient has been placed in psychiatric observation due to the need to provide a safe environment for the patient while obtaining psychiatric consultation and evaluation, as well as ongoing medical and medication management to treat the patient's condition.  The patient has not been placed under full IVC at this time.    MEDICATIONS GIVEN IN ED: Medications  albuterol  (VENTOLIN  HFA) 108 (90 Base) MCG/ACT inhaler 2 puff (has no administration in time range)  metFORMIN  (GLUCOPHAGE ) tablet 500 mg (has no administration in time range)  fluticasone furoate-vilanterol (BREO ELLIPTA) 100-25  MCG/ACT 1 puff (has no administration in time range)  pantoprazole  (PROTONIX ) EC tablet 40 mg (has no administration in time range)  rosuvastatin  (CRESTOR ) tablet 10 mg (has no administration in time range)  Tiotropium Bromide AERS 2 puff (has no administration in time range)  topiramate  (TOPAMAX ) tablet 25 mg (has no administration in time range)  estradiol  (ESTRACE ) tablet 1 mg (has no administration in time range)  dutasteride (AVODART) capsule 0.5 mg (has no administration in time range)     ED COURSE:  ***   CONSULTS:  ***   OUTSIDE RECORDS REVIEWED: Reviewed recent pulmonology and neurology notes.       FINAL CLINICAL IMPRESSION(S) / ED DIAGNOSES   Final diagnoses:  Suicidal ideation     Rx / DC Orders   ED Discharge Orders     None        Note:  This document was prepared using Dragon voice recognition software and may include unintentional dictation errors.

## 2023-11-18 ENCOUNTER — Inpatient Hospital Stay (HOSPITAL_COMMUNITY)
Admission: AD | Admit: 2023-11-18 | Discharge: 2023-11-22 | DRG: 885 | Disposition: A | Discharge status: Home / Self Care | Source: Intra-hospital

## 2023-11-18 ENCOUNTER — Encounter (HOSPITAL_COMMUNITY): Payer: Self-pay | Admitting: Psychiatry

## 2023-11-18 DIAGNOSIS — F314 Bipolar disorder, current episode depressed, severe, without psychotic features: Secondary | ICD-10-CM | POA: Diagnosis present

## 2023-11-18 DIAGNOSIS — Z59868 Other specified financial insecurity: Secondary | ICD-10-CM | POA: Diagnosis not present

## 2023-11-18 DIAGNOSIS — R45851 Suicidal ideations: Secondary | ICD-10-CM | POA: Diagnosis present

## 2023-11-18 DIAGNOSIS — Z91410 Personal history of adult physical and sexual abuse: Secondary | ICD-10-CM | POA: Diagnosis not present

## 2023-11-18 DIAGNOSIS — Z79899 Other long term (current) drug therapy: Secondary | ICD-10-CM | POA: Diagnosis not present

## 2023-11-18 DIAGNOSIS — F39 Unspecified mood [affective] disorder: Secondary | ICD-10-CM

## 2023-11-18 DIAGNOSIS — Z7951 Long term (current) use of inhaled steroids: Secondary | ICD-10-CM

## 2023-11-18 DIAGNOSIS — Z7982 Long term (current) use of aspirin: Secondary | ICD-10-CM | POA: Diagnosis not present

## 2023-11-18 DIAGNOSIS — Z560 Unemployment, unspecified: Secondary | ICD-10-CM | POA: Diagnosis not present

## 2023-11-18 DIAGNOSIS — Z9151 Personal history of suicidal behavior: Secondary | ICD-10-CM | POA: Diagnosis not present

## 2023-11-18 DIAGNOSIS — Z79818 Long term (current) use of other agents affecting estrogen receptors and estrogen levels: Secondary | ICD-10-CM | POA: Diagnosis not present

## 2023-11-18 DIAGNOSIS — F640 Transsexualism: Secondary | ICD-10-CM | POA: Diagnosis present

## 2023-11-18 DIAGNOSIS — Z91148 Patient's other noncompliance with medication regimen for other reason: Secondary | ICD-10-CM | POA: Diagnosis not present

## 2023-11-18 DIAGNOSIS — G43909 Migraine, unspecified, not intractable, without status migrainosus: Secondary | ICD-10-CM | POA: Diagnosis present

## 2023-11-18 DIAGNOSIS — F4310 Post-traumatic stress disorder, unspecified: Secondary | ICD-10-CM | POA: Diagnosis present

## 2023-11-18 DIAGNOSIS — F419 Anxiety disorder, unspecified: Secondary | ICD-10-CM | POA: Diagnosis present

## 2023-11-18 DIAGNOSIS — Z91411 Personal history of adult psychological abuse: Secondary | ICD-10-CM

## 2023-11-18 DIAGNOSIS — Z8249 Family history of ischemic heart disease and other diseases of the circulatory system: Secondary | ICD-10-CM | POA: Diagnosis not present

## 2023-11-18 DIAGNOSIS — R7303 Prediabetes: Secondary | ICD-10-CM | POA: Diagnosis present

## 2023-11-18 DIAGNOSIS — Z91018 Allergy to other foods: Secondary | ICD-10-CM | POA: Diagnosis not present

## 2023-11-18 DIAGNOSIS — K219 Gastro-esophageal reflux disease without esophagitis: Secondary | ICD-10-CM | POA: Diagnosis present

## 2023-11-18 DIAGNOSIS — Z555 Less than a high school diploma: Secondary | ICD-10-CM

## 2023-11-18 DIAGNOSIS — F17200 Nicotine dependence, unspecified, uncomplicated: Secondary | ICD-10-CM | POA: Diagnosis present

## 2023-11-18 DIAGNOSIS — F17210 Nicotine dependence, cigarettes, uncomplicated: Secondary | ICD-10-CM | POA: Diagnosis present

## 2023-11-18 DIAGNOSIS — Z7984 Long term (current) use of oral hypoglycemic drugs: Secondary | ICD-10-CM

## 2023-11-18 DIAGNOSIS — Z8709 Personal history of other diseases of the respiratory system: Secondary | ICD-10-CM

## 2023-11-18 DIAGNOSIS — J4489 Other specified chronic obstructive pulmonary disease: Secondary | ICD-10-CM | POA: Diagnosis present

## 2023-11-18 DIAGNOSIS — Z62811 Personal history of psychological abuse in childhood: Secondary | ICD-10-CM

## 2023-11-18 DIAGNOSIS — Z8669 Personal history of other diseases of the nervous system and sense organs: Secondary | ICD-10-CM

## 2023-11-18 DIAGNOSIS — Z5941 Food insecurity: Secondary | ICD-10-CM

## 2023-11-18 DIAGNOSIS — Z62810 Personal history of physical and sexual abuse in childhood: Secondary | ICD-10-CM

## 2023-11-18 DIAGNOSIS — G479 Sleep disorder, unspecified: Secondary | ICD-10-CM | POA: Diagnosis present

## 2023-11-18 DIAGNOSIS — Z5982 Transportation insecurity: Secondary | ICD-10-CM

## 2023-11-18 LAB — URINE DRUG SCREEN, QUALITATIVE (ARMC ONLY)
Amphetamines, Ur Screen: NOT DETECTED
Barbiturates, Ur Screen: NOT DETECTED
Benzodiazepine, Ur Scrn: NOT DETECTED
Cannabinoid 50 Ng, Ur ~~LOC~~: NOT DETECTED
Cocaine Metabolite,Ur ~~LOC~~: NOT DETECTED
MDMA (Ecstasy)Ur Screen: NOT DETECTED
Methadone Scn, Ur: NOT DETECTED
Opiate, Ur Screen: NOT DETECTED
Phencyclidine (PCP) Ur S: NOT DETECTED
Tricyclic, Ur Screen: NOT DETECTED

## 2023-11-18 MED ORDER — PROGESTERONE MICRONIZED 100 MG PO CAPS
200.0000 mg | ORAL_CAPSULE | Freq: Every day | ORAL | Status: DC
  Administered 2023-11-19 – 2023-11-22 (×4): 200 mg via ORAL
  Filled 2023-11-18 (×4): qty 2

## 2023-11-18 MED ORDER — HALOPERIDOL 5 MG PO TABS: 5.0000 mg | ORAL_TABLET | Freq: Three times a day (TID) | ORAL | Status: DC | PRN

## 2023-11-18 MED ORDER — HYDROXYZINE HCL 50 MG PO TABS
50.0000 mg | ORAL_TABLET | Freq: Four times a day (QID) | ORAL | Status: DC | PRN
  Administered 2023-11-20 – 2023-11-21 (×2): 50 mg via ORAL
  Filled 2023-11-18 (×2): qty 1

## 2023-11-18 MED ORDER — FLUTICASONE FUROATE-VILANTEROL 100-25 MCG/ACT IN AEPB
1.0000 | INHALATION_SPRAY | Freq: Every day | RESPIRATORY_TRACT | Status: DC
  Filled 2023-11-18: qty 28

## 2023-11-18 MED ORDER — ROSUVASTATIN CALCIUM 5 MG PO TABS
10.0000 mg | ORAL_TABLET | Freq: Every day | ORAL | Status: DC
  Administered 2023-11-19 – 2023-11-22 (×4): 10 mg via ORAL
  Filled 2023-11-18 (×4): qty 2

## 2023-11-18 MED ORDER — METFORMIN HCL 500 MG PO TABS
500.0000 mg | ORAL_TABLET | Freq: Every day | ORAL | Status: DC
  Administered 2023-11-19 – 2023-11-22 (×4): 500 mg via ORAL
  Filled 2023-11-18 (×4): qty 1

## 2023-11-18 MED ORDER — TOPIRAMATE 25 MG PO TABS
100.0000 mg | ORAL_TABLET | Freq: Every day | ORAL | Status: DC
  Administered 2023-11-18: 100 mg via ORAL
  Filled 2023-11-18: qty 4

## 2023-11-18 MED ORDER — DIPHENHYDRAMINE HCL 25 MG PO CAPS: 50.0000 mg | ORAL_CAPSULE | Freq: Three times a day (TID) | ORAL | Status: DC | PRN

## 2023-11-18 MED ORDER — NICOTINE 21 MG/24HR TD PT24
21.0000 mg | MEDICATED_PATCH | Freq: Every day | TRANSDERMAL | Status: DC
  Administered 2023-11-19 – 2023-11-22 (×4): 21 mg via TRANSDERMAL
  Filled 2023-11-18 (×4): qty 1

## 2023-11-18 MED ORDER — PROGESTERONE MICRONIZED 100 MG PO CAPS
200.0000 mg | ORAL_CAPSULE | Freq: Every day | ORAL | Status: DC
  Administered 2023-11-18: 200 mg via ORAL
  Filled 2023-11-18: qty 2

## 2023-11-18 MED ORDER — ALUM & MAG HYDROXIDE-SIMETH 200-200-20 MG/5ML PO SUSP: 30.0000 mL | ORAL | Status: DC | PRN

## 2023-11-18 MED ORDER — HALOPERIDOL LACTATE 5 MG/ML IJ SOLN: 5.0000 mg | Freq: Three times a day (TID) | INTRAMUSCULAR | Status: DC | PRN

## 2023-11-18 MED ORDER — LORAZEPAM 2 MG/ML IJ SOLN
2.0000 mg | Freq: Three times a day (TID) | INTRAMUSCULAR | Status: DC | PRN
Start: 2023-11-18 — End: 2023-11-22

## 2023-11-18 MED ORDER — UMECLIDINIUM BROMIDE 62.5 MCG/ACT IN AEPB
1.0000 | INHALATION_SPRAY | Freq: Every day | RESPIRATORY_TRACT | Status: DC
  Filled 2023-11-18: qty 7

## 2023-11-18 MED ORDER — ESTRADIOL 0.5 MG PO TABS
2.0000 mg | ORAL_TABLET | Freq: Two times a day (BID) | ORAL | Status: DC
  Administered 2023-11-18: 2 mg via ORAL
  Filled 2023-11-18: qty 4

## 2023-11-18 MED ORDER — ESTRADIOL 1 MG PO TABS
2.0000 mg | ORAL_TABLET | Freq: Two times a day (BID) | ORAL | Status: DC
  Administered 2023-11-18 – 2023-11-22 (×8): 2 mg via ORAL
  Filled 2023-11-18 (×8): qty 2

## 2023-11-18 MED ORDER — ACETAMINOPHEN 325 MG PO TABS
650.0000 mg | ORAL_TABLET | Freq: Four times a day (QID) | ORAL | Status: DC | PRN
  Administered 2023-11-18: 650 mg via ORAL
  Filled 2023-11-18: qty 2

## 2023-11-18 MED ORDER — PANTOPRAZOLE SODIUM 40 MG PO TBEC
40.0000 mg | DELAYED_RELEASE_TABLET | Freq: Every day | ORAL | Status: DC
  Administered 2023-11-19 – 2023-11-22 (×4): 40 mg via ORAL
  Filled 2023-11-18 (×4): qty 1

## 2023-11-18 MED ORDER — HALOPERIDOL LACTATE 5 MG/ML IJ SOLN: 10.0000 mg | Freq: Three times a day (TID) | INTRAMUSCULAR | Status: DC | PRN

## 2023-11-18 MED ORDER — DIPHENHYDRAMINE HCL 50 MG/ML IJ SOLN: 50.0000 mg | Freq: Three times a day (TID) | INTRAMUSCULAR | Status: DC | PRN

## 2023-11-18 MED ORDER — HYDROXYZINE HCL 25 MG PO TABS
50.0000 mg | ORAL_TABLET | Freq: Four times a day (QID) | ORAL | Status: DC | PRN
Start: 2023-11-18 — End: 2023-11-18

## 2023-11-18 MED ORDER — DIAZEPAM 5 MG/ML IJ SOLN: 10.0000 mg | Freq: Four times a day (QID) | INTRAMUSCULAR | Status: DC | PRN

## 2023-11-18 MED ORDER — MAGNESIUM HYDROXIDE 400 MG/5ML PO SUSP: 30.0000 mL | Freq: Every day | ORAL | Status: DC | PRN

## 2023-11-18 MED ORDER — ALBUTEROL SULFATE (2.5 MG/3ML) 0.083% IN NEBU
2.5000 mg | INHALATION_SOLUTION | RESPIRATORY_TRACT | Status: DC | PRN
Start: 2023-11-18 — End: 2023-11-22

## 2023-11-18 MED ORDER — ZIPRASIDONE MESYLATE 20 MG IM SOLR: 10.0000 mg | Freq: Four times a day (QID) | INTRAMUSCULAR | Status: DC | PRN

## 2023-11-18 MED ORDER — DUTASTERIDE 0.5 MG PO CAPS
0.5000 mg | ORAL_CAPSULE | Freq: Every day | ORAL | Status: DC
  Administered 2023-11-19 – 2023-11-22 (×4): 0.5 mg via ORAL
  Filled 2023-11-18 (×6): qty 1

## 2023-11-18 MED ORDER — TOPIRAMATE 100 MG PO TABS
100.0000 mg | ORAL_TABLET | Freq: Every day | ORAL | Status: DC
  Administered 2023-11-19 – 2023-11-22 (×4): 100 mg via ORAL
  Filled 2023-11-18 (×4): qty 1

## 2023-11-18 MED ORDER — DIPHENHYDRAMINE HCL 50 MG/ML IJ SOLN
50.0000 mg | Freq: Three times a day (TID) | INTRAMUSCULAR | Status: DC | PRN
Start: 2023-11-18 — End: 2023-11-22

## 2023-11-18 MED ORDER — LORAZEPAM 2 MG/ML IJ SOLN: 2.0000 mg | Freq: Three times a day (TID) | INTRAMUSCULAR | Status: DC | PRN

## 2023-11-18 NOTE — Group Note (Signed)
 Date:  11/18/2023 Time:  4:52 PM  Group Topic/Focus:  Overcoming Stress:   The focus of this group is learning gratitude to overcome stress.    Participation Level:  Minimal  Participation Quality:  Inattentive  Affect:  Flat and Irritable  Cognitive:  Alert  Insight: None  Engagement in Group:  Resistant  Modes of Intervention:  Discussion   Inocente PARAS Khang Hannum 11/18/2023, 4:52 PM

## 2023-11-18 NOTE — Progress Notes (Signed)
 Clifford CHRISTELLA Boyer, RN, 11/18/23, Time of arrival: 82  Patient is a new admit to unit. Patient is voluntary. Patient belongings addressed and stored. Skin check performed with two staff, results unremarkable. Vital signs unremarkable. No reported or observed physiological concerns or abnormalities. Patient engaged with assessment with encouragement. Patient orientated to facility, unit and room. All questions and concerns addressed at this time.  Patient stated reason for admission/reason for being here: planned to overdose on pills  Patient current presentation remarkable for: Flat, guarded, denies SI at this time.  Patient history and collateral remarkable for: Per patient this is her first behavioral health admission. Reports history of prediabetes.   On admission when locking up patients belongings she had a pacifier that she had in her mouth that she was adamant on keeping due to it being an item of comfort. Patient initially began to demand to leave and that she would not be staying. But after much education and encouragement of how this item is not allowed on the unit and she was not allowed to leave at this point she threw it into her belonging bag. When offered alternatives to the pacifier that were allowed on the unit the patient just continued to repeat No I only want my Nookie (Pacifier). Patient offered emotional support.

## 2023-11-18 NOTE — ED Notes (Signed)
 Report called to [place] at [facility]

## 2023-11-18 NOTE — Consult Note (Addendum)
 [facility] Consult Note  Patient Name: Clifford Burgess MRN: [id number] DOB: Jul 15, 1970 DATE OF Consult: 11/18/2023  PRIMARY PSYCHIATRIC DIAGNOSES   1.  Mood Disorder 2.  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  PRN's:  hydroxyzine , 50 mg q6h PRN anxiety; for severe agitation/aggression:  Geodon 10 mg IM q6h PRN and Valium  10 mg IM q6h PRN Non-Medication/therapeutic recommendations: Patient still having suicidal thoughts, so continue close observation, as per ED protocol, until can be safely admitted to Psychiatry.  Continue matter-of-fact emotional support in ED, pending transfer. Is inpatient psychiatric hospitalization recommended for this patient? Yes (Explain why): Patient continues with suicidal ideation and severe depression, so continues to meet IVC criteria for admission.  Patient is willing to come in voluntarily, but IVC paperwork should be filed if she wishes to leave AMA Is another care setting recommended for this patient? (examples may include Crisis Stabilization Unit, Residential/Recovery Treatment, ALF/SNF, Memory Care Unit)  No (Explain why): As above From a psychiatric perspective, is this patient appropriate for discharge to an outpatient setting/resource or other less restrictive environment for continued care?  No (Explain why): As above Follow-Up Telepsychiatry C/L services: We will sign off for now. Please re-consult our service if needed for any concerning changes in the patient's condition, discharge planning, or questions. Communication: Treatment team members (and family members if applicable) who were involved in treatment/care discussions and planning, and with whom we spoke or engaged with via secure text/chat, include the following: Secure message sent to Dr.  Neomi, ED attending, and ED staff, outlining recommendations.  Thank you for involving us  in the care of this patient. If you have any additional questions or concerns, please call [phone] and ask for the provider on-call.   TELEPSYCHIATRY ATTESTATION & CONSENT   As the provider for this telehealth consult, I attest that I verified the patient's identity using two separate identifiers, introduced myself to the patient, provided my credentials, disclosed my location, and performed this encounter via a HIPAA-compliant, real-time, face-to-face, two-way, interactive audio and video platform and with the full consent and agreement of the patient (or guardian as applicable.)   Patient physical location: [name]. Telehealth provider physical location: home office in state of [place] .  Video start time: 0645h EST  Video end time: 0700h EST   Total time spent in this encounter was 30 minutes, including record review, clinical interview, behavior observations, discussion of impressions and recommendations (including medications and hospitalization), and consultation/communication with relevant parties   IDENTIFYING DATA  Clifford Burgess is a 53 y.o. year-old adult for whom a psychiatric consultation has been ordered by the primary provider. The patient was identified using two separate identifiers.  CHIEF COMPLAINT/REASON FOR CONSULT   I've just given up.  I can't take it anymore.   HISTORY OF PRESENT ILLNESS (HPI)   The patient presents with longstanding mood dysregulation, likely related to a history of severe early childhood trauma.  Patient has had several hospitalizations throughout her life, with several suicide attempts, most recently (both) about three years ago, when again overdosed on meds.    Patient now presents again with suicidal ideation with plans to overdose.  Repeatedly states that she is having a lot of things going on, but cannot be specific.   Feels overwhelmed and hopeless.  Has not been taking any psychiatric  medications (and cannot remember what meds she has tried in the past), and she has not been in psychiatric treatment for a while.  Denies any current substance use, but past history of cocaine use.  UDS pending.  BAL negative.  No homicidal ideation.  No psychotic sx's.   PAST PSYCHIATRIC HISTORY  As above Otherwise as per HPI above.  PAST MEDICAL HISTORY  Past Medical History:  Diagnosis Date   Asthma    COPD (chronic obstructive pulmonary disease) (HCC)    GERD (gastroesophageal reflux disease)      HOME MEDICATIONS  Facility Ordered Medications  Medication   albuterol  (PROVENTIL ) (2.5 MG/3ML) 0.083% nebulizer solution 2.5 mg   metFORMIN  (GLUCOPHAGE ) tablet 500 mg   fluticasone furoate-vilanterol (BREO ELLIPTA) 100-25 MCG/ACT 1 puff   pantoprazole  (PROTONIX ) EC tablet 40 mg   rosuvastatin  (CRESTOR ) tablet 10 mg   Tiotropium Bromide AERS 2 puff   dutasteride (AVODART) capsule 0.5 mg   nicotine  (NICODERM CQ  - dosed in mg/24 hours) patch 21 mg   estradiol  (ESTRACE ) tablet 2 mg   topiramate  (TOPAMAX ) tablet 100 mg   hydrOXYzine  (ATARAX ) tablet 50 mg   ziprasidone (GEODON) injection 10 mg   diazepam  (VALIUM ) injection 10 mg   PTA Medications  Medication Sig   OXYGEN  Inhale 2 L into the lungs. At night pt uses 24 hrs from [facility]   aspirin EC 81 MG tablet Take 81 mg by mouth daily.   spironolactone  (ALDACTONE ) 50 MG tablet Take 1 tablet (50 mg total) by mouth 2 (two) times daily.   lactulose  (CHRONULAC ) 10 GM/15ML solution Take 30 mLs (20 g total) by mouth daily as needed for mild constipation.   ondansetron  (ZOFRAN -ODT) 4 MG disintegrating tablet Take 1 tablet (4 mg total) by mouth every 8 (eight) hours as needed for nausea or vomiting.   estradiol  valerate (DELESTROGEN) 40 MG/ML injection Inject 40 mg into the muscle every 28 (twenty-eight) days. Patient gets this medication from [facility].    progesterone  (PROMETRIUM ) 200 MG capsule SMARTSIG:200 Milligram(s) By Mouth Every Evening   estradiol  (ESTRACE ) 1 MG tablet Take 1 mg by mouth 2 (two) times daily.   rosuvastatin  (CRESTOR ) 10 MG tablet TAKE 1 TABLET BY MOUTH EVERY DAY   dutasteride (AVODART) 0.5 MG capsule Take 0.5 mg by mouth daily.   metoprolol tartrate (LOPRESSOR) 50 MG tablet Take 50 mg by mouth 2 (two) times daily.   topiramate  (TOPAMAX ) 25 MG tablet Take 1 tablet (25 mg total) by mouth daily.   metFORMIN  (GLUCOPHAGE ) 500 MG tablet Take 1 tablet (500 mg total) by mouth daily with breakfast.   Continuous Glucose Sensor (DEXCOM G7 SENSOR) MISC USE AS DIRECTED EVERY 10 DAYS   ipratropium-albuterol  (DUONEB) 0.5-2.5 (3) MG/3ML SOLN INHALE 3 ML (1 VIAL) BY NEBULIZER EVERY 6 HOURS AS NEEDED   albuterol  (VENTOLIN  HFA) 108 (90 Base) MCG/ACT inhaler TAKE 2 PUFFS BY MOUTH EVERY 6 HOURS AS NEEDED FOR WHEEZE OR SHORTNESS OF BREATH   mometasone -formoterol  (DULERA) 100-5 MCG/ACT AERO Inhale 2 puffs into the lungs 2 (two) times daily.   Tiotropium Bromide Monohydrate  (SPIRIVA  RESPIMAT) 2.5 MCG/ACT AERS Inhale 2 puffs into the lungs daily.   Blood Glucose Monitoring Suppl (ACCU-CHEK GUIDE ME) w/Device KIT Use as directed. E11.65   Accu-Chek Softclix Lancets lancets Use as instructed   glucose blood test strip Use as instructed   meclizine  (ANTIVERT ) 25 MG tablet TAKE 1 TABLET BY MOUTH 3 TIMES DAILY AS NEEDED FOR DIZZINESS.   omeprazole  (PRILOSEC) 20  MG capsule TAKE 1 CAPSULE BY MOUTH EVERY DAY     ALLERGIES  Allergies  Allergen Reactions   Chocolate Nausea And Vomiting and Other (See Comments)    SOCIAL & SUBSTANCE USE HISTORY  Social History   Socioeconomic History   Marital status: Divorced    Spouse name: Not on file   Number of children: 2   Years of education: Not on file   Highest education level: 8th grade  Occupational History   Not on file  Tobacco Use   Smoking status: Some Days    Current packs/day: 1.00     Average packs/day: 1 pack/day for 36.0 years (36.0 ttl pk-yrs)    Types: Cigarettes   Smokeless tobacco: Former    Types: Chew   Tobacco comments:    1/2 - 1 pack a day.  Started smoking age 82./3 a day  Vaping Use   Vaping status: Never Used  Substance and Sexual Activity   Alcohol use: Not Currently    Alcohol/week: 2.0 standard drinks of alcohol    Types: 2 Cans of beer per week    Comment: occasional   Drug use: Yes    Types: Cocaine, Marijuana, Heroin, LSD, Crack cocaine    Comment: 11/10/21  Pt reports drug use last week   Sexual activity: Not Currently  Other Topics Concern   Not on file  Social History Narrative   Not on file   Social Drivers of Health   Financial Resource Strain: High Risk (09/28/2023)   Received from [facility]   Overall Financial Resource Strain (CARDIA)    Difficulty of Paying Living Expenses: Hard  Food Insecurity: Food Insecurity Present (09/28/2023)   Received from [facility]   Hunger Vital Sign    Within the past 12 months, you worried that your food would run out before you got the money to buy more.: Sometimes true    Within the past 12 months, the food you bought just didn't last and you didn't have money to get more.: Sometimes true  Transportation Needs: Unmet Transportation Needs (09/28/2023)   Received from [facility] - Transportation    In the past 12 months, has lack of transportation kept you from medical appointments or from getting medications?: No    Lack of Transportation (Non-Medical): Yes  Physical Activity: Insufficiently Active (08/02/2023)   Received from [facility]   Exercise Vital Sign    On average, how many days per week do you engage in moderate to strenuous exercise (like a brisk walk)?: 7 days    On average, how many minutes do you engage in exercise at this level?: 10 min  Stress: Stress Concern Present (08/02/2023)   Received from  [facility] - Occupational Stress Questionnaire    Feeling of Stress : Very much  Social Connections: Unknown (08/02/2023)   Received from [facility]   Social Connection and Isolation Panel    In a typical week, how many times do you talk on the phone with family, friends, or neighbors?: More than three times a week    How often do you get together with friends or relatives?: More than three times a week    Attends Religious Services: Not on file    Do you belong to any clubs or organizations such as church groups, unions, fraternal or athletic groups, or school groups?: No  How often do you attend meetings of the clubs or organizations you belong to?: Never    Are you married, widowed, divorced, separated, never married, or living with a partner?: Never married   Social History   Tobacco Use  Smoking Status Some Days   Current packs/day: 1.00   Average packs/day: 1 pack/day for 36.0 years (36.0 ttl pk-yrs)   Types: Cigarettes  Smokeless Tobacco Former   Types: Chew  Tobacco Comments   1/2 - 1 pack a day.  Started smoking age 89./3 a day   Social History   Substance and Sexual Activity  Alcohol Use Not Currently   Alcohol/week: 2.0 standard drinks of alcohol   Types: 2 Cans of beer per week   Comment: occasional   Social History   Substance and Sexual Activity  Drug Use Yes   Types: Cocaine, Marijuana, Heroin, LSD, Crack cocaine   Comment: 11/10/21  Pt reports drug use last week    Additional pertinent information Lives with sister.  Is on disability.  FAMILY HISTORY  Family History  Problem Relation Age of Onset   Hypertension Father    Family Psychiatric History (if known):  Did not report  MENTAL STATUS EXAM (MSE)  Mental Status Exam: General Appearance: Fairly Groomed  Orientation:  Full (Time, Place, and Person)  Memory:  Immediate;   Fair Recent;   Fair Remote;   Fair   Concentration:  Concentration: Poor and Attention Span: Poor  Recall:  Fair  Attention  Poor  Eye Contact:  Poor  Speech:  Slow  Language:  Good  Volume:  Decreased  Mood: I'm not OK.  Affect:  Depressed and Flat  Thought Process:  Descriptions of Associations: Circumstantial  Thought Content:  Rumination  Suicidal Thoughts:  Yes.  with intent/plan  Homicidal Thoughts:  No  Judgement:  Impaired  Insight:  Lacking  Psychomotor Activity:  Decreased  Akathisia:  Negative  Fund of Knowledge:  Fair    Assets:  Manufacturing Systems Engineer Desire for Improvement Housing Social Support  Cognition:  WNL  ADL's:  Intact  AIMS (if indicated):       VITALS  Blood pressure 132/80, pulse 78, temperature 99.3 F (37.4 C), temperature source Oral, resp. rate 16, SpO2 99%.  LABS  Admission on 11/17/2023  Component Date Value Ref Range Status   Sodium 11/17/2023 139  135 - 145 mmol/L Final   Potassium 11/17/2023 4.0  3.5 - 5.1 mmol/L Final   Chloride 11/17/2023 101  98 - 111 mmol/L Final   CO2 11/17/2023 25  22 - 32 mmol/L Final   Glucose, Bld 11/17/2023 115 (H)  70 - 99 mg/dL Final   Glucose reference range applies only to samples taken after fasting for at least 8 hours.   BUN 11/17/2023 14  6 - 20 mg/dL Final   Creatinine, Ser 11/17/2023 1.10  0.61 - 1.24 mg/dL Final   Calcium  11/17/2023 9.2  8.9 - 10.3 mg/dL Final   Total Protein 88/91/7974 7.7  6.5 - 8.1 g/dL Final   Albumin 88/91/7974 3.9  3.5 - 5.0 g/dL Final   AST 88/91/7974 15  15 - 41 U/L Final   ALT 11/17/2023 10  0 - 44 U/L Final   Alkaline Phosphatase 11/17/2023 54  38 - 126 U/L Final   Total Bilirubin 11/17/2023 0.6  0.0 - 1.2 mg/dL Final   GFR, Estimated 11/17/2023 >60  >60 mL/min Final   Comment: (NOTE) Calculated using the CKD-EPI Creatinine Equation (2021)  Anion gap 11/17/2023 13  5 - 15 Final   Performed at [facility], [address] [place], [place] [address]   Alcohol, Ethyl (B) 11/17/2023 <15  <15  mg/dL Final   Comment: (NOTE) For medical purposes only. Performed at [facility], [address] [place], [place] [address]    WBC 11/17/2023 9.7  4.0 - 10.5 K/uL Final   RBC 11/17/2023 4.63  4.22 - 5.81 MIL/uL Final   Hemoglobin 11/17/2023 14.0  13.0 - 17.0 g/dL Final   HCT 88/91/7974 43.5  39.0 - 52.0 % Final   MCV 11/17/2023 94.0  80.0 - 100.0 fL Final   MCH 11/17/2023 30.2  26.0 - 34.0 pg Final   MCHC 11/17/2023 32.2  30.0 - 36.0 g/dL Final   RDW 88/91/7974 12.7  11.5 - 15.5 % Final   Platelets 11/17/2023 429 (H)  150 - 400 K/uL Final   nRBC 11/17/2023 0.0  0.0 - 0.2 % Final   Performed at [facility], [address] [place], [place] [address]    PSYCHIATRIC REVIEW OF SYSTEMS (ROS)  ROS: Notable for the following relevant positive findings: Review of Systems  Constitutional: Negative.   HENT: Negative.    Eyes: Negative.   Respiratory: Negative.    Cardiovascular: Negative.   Gastrointestinal: Negative.   Genitourinary: Negative.   Musculoskeletal: Negative.   Skin: Negative.   Neurological: Negative.   Endo/Heme/Allergies: Negative.   Psychiatric/Behavioral:  Positive for depression and suicidal ideas. The patient is nervous/anxious.     Additional findings:      Musculoskeletal: No abnormal movements observed      Gait & Station: Laying/Sitting      Pain Screening: Denies      Nutrition & Dental Concerns:  Reviewed   RISK FORMULATION/ASSESSMENT  Is the patient experiencing any suicidal or homicidal ideations: Yes        Explain if yes:   Patient continues with suicidal ideations with plan to overdose on medications  Protective factors considered for safety management:   Patient with some community support, but still feels overwhelmed and unable to manage her emotions  Risk factors/concerns considered for safety management:   Prior attempt Depression Access to lethal means Hopelessness Impulsivity Isolation Barriers to  accessing treatment Unmarried  Is there a safety management plan with the patient and treatment team to minimize risk factors and promote protective factors: No            Explain: As above, without much reliable community support  Is crisis care placement or psychiatric hospitalization recommended: Yes     Based on my current evaluation and risk assessment, patient is determined at this time to be at:  High risk  *RISK ASSESSMENT Risk assessment is a dynamic process; it is possible that this patient's condition, and risk level, may change. This should be re-evaluated and managed over time as appropriate. Please re-consult psychiatric consult services if additional assistance is needed in terms of risk assessment and management. If your team decides to discharge this patient, please advise the patient how to best access emergency psychiatric services, or to call 911, if their condition worsens or they feel unsafe in any way.   Adriana JINNY Pontes, MD Telepsychiatry Consult Services

## 2023-11-18 NOTE — Progress Notes (Signed)
(  Sleep Hours) - (Any PRNs that were needed, meds refused, or side effects to meds)-  (Any disturbances and when (visitation, over night)- (Concerns raised by the patient)-  (SI/HI/AVH)-

## 2023-11-18 NOTE — BH Assessment (Signed)
 Comprehensive Clinical Assessment (CCA) Screening, Triage and Referral Note  11/18/2023 Clifford Burgess [id number] Recommendations for Services/Supports/Treatments: [facility] consult/Disposition pending. Clifford Burgess. Clifford "[name]" is a 53 year old, English speaking, Caucasian transgender male . Pt presented to [facility] ED voluntarily due to having thoughts of SI. Per triage note: Pt reports suicidal ideation after a lot of stuff has been occurring in her life.SI started today with plan to OD on her medications. Denies ingestion, EOTH, or drugs. Is cooperative with triage staff  Mental Status Examination: Patient presented with a somber mood and a congruent affect. Speech was clear and coherent. Appearance was within normal limits, though it was noted that the patient was sucking on a pacifier during the assessment. Motor behavior was restless, and the patient avoided eye contact. She appeared guarded and declined to elaborate on specific stressors. Presenting Problem: Patient identified her main stressor as "a lot of things," without providing further detail. She reported living with her sister. Suicidality: Patient endorsed experiencing suicidal ideation (SI) with a plan to overdose on pills. She is not currently connected to mental health services and does not take any psychiatric medications. Substance Use History: Patient denied current substance use and reported being abstinent from cocaine for the past 2 years. Insight & Judgment: Patient demonstrated limited insight and poor judgment during the assessment. Clinical Impression: Patient presents with symptoms of emotional distress, including somber mood, guarded behavior, and suicidal ideation with a specific plan but no stated intent. Lack of engagement with mental health services and absence of medication management are concerning. Her use of a pacifier and guarded demeanor may suggest underlying psychological or developmental factors that  warrant further evaluation. Continued monitoring and linkage to appropriate services are recommended. Chief Complaint:  Chief Complaint  Patient presents with   Suicidal   Visit Diagnosis: MDD recurrent, severe  Patient Reported Information How did you hear about us ? No data recorded What Is the Reason for Your Visit/Call Today? No data recorded How Long Has This Been Causing You Problems? No data recorded What Do You Feel Would Help You the Most Today? No data recorded  Have You Recently Had Any Thoughts About Hurting Yourself? No data recorded Are You Planning to Commit Suicide/Harm Yourself At This time? No data recorded  Have you Recently Had Thoughts About Hurting Someone [place]? No data recorded Are You Planning to Harm Someone at This Time? No data recorded Explanation: No data recorded  Have You Used Any Alcohol or Drugs in the Past 24 Hours? No data recorded How Long Ago Did You Use Drugs or Alcohol? No data recorded What Did You Use and How Much? No data recorded  Do You Currently Have a Therapist/Psychiatrist? No data recorded Name of Therapist/Psychiatrist: No data recorded  Have You Been Recently Discharged From Any Office Practice or Programs? No data recorded Explanation of Discharge From Practice/Program: No data recorded   CCA Screening Triage Referral Assessment Type of Contact: No data recorded Telemedicine Service Delivery:   Is this Initial or Reassessment?   Date Telepsych consult ordered in [facility]:    Time Telepsych consult ordered in [facility]:    Location of Assessment: No data recorded Provider Location: No data recorded   Collateral Involvement: No data recorded  Does Patient Have a Court Appointed Legal Guardian? No data recorded Name and Contact of Legal Guardian: No data recorded If Minor and Not Living with Parent(s), Who has Custody? No data recorded Is CPS involved or ever been involved? No data recorded Is APS  involved or ever been involved? No  data recorded  Patient Determined To Be At Risk for Harm To Self or Others Based on Review of Patient Reported Information or Presenting Complaint? No data recorded Method: No data recorded Availability of Means: No data recorded Intent: No data recorded Notification Required: No data recorded Additional Information for Danger to Others Potential: No data recorded Additional Comments for Danger to Others Potential: No data recorded Are There Guns or Other Weapons in Your Home? No data recorded Types of Guns/Weapons: No data recorded Are These Weapons Safely Secured?                            No data recorded Who Could Verify You Are Able To Have These Secured: No data recorded Do You Have any Outstanding Charges, Pending Court Dates, Parole/Probation? No data recorded Contacted To Inform of Risk of Harm To Self or Others: No data recorded  Does Patient Present under Involuntary Commitment? No data recorded   [place] of Residence: No data recorded  Patient Currently Receiving the Following Services: No data recorded  Determination of Need: No data recorded  Options For Referral: No data recorded  Disposition Recommendation per psychiatric provider: Pending psych consult.   Corrinne Benegas R Teal Raben, LCAS

## 2023-11-18 NOTE — Group Note (Signed)
 Date:  11/18/2023 Time:  11:00 PM  Group Topic/Focus:  Wrap-Up Group:   The focus of this group is to help patients review their daily goal of treatment and discuss progress on daily workbooks.    Participation Level:  Minimal  Participation Quality:  Resistant  Affect:  Anxious and Flat  Cognitive:  Appropriate  Insight: Good  Engagement in Group:  Engaged  Modes of Intervention:  Discussion  Additional Comments:   Pt states that they are proud of themself for coming in to get help. Pt is anxious and withdrawn during group but states they have been participating in programming and recreational activities. Pt endorsed anxiety at a 5  Clifford Burgess A Clifford Burgess 11/18/2023, 11:00 PM

## 2023-11-18 NOTE — Tx Team (Signed)
 Initial Treatment Plan 11/18/2023 5:33 PM [name] FMW:[id number]    PATIENT STRESSORS: Substance abuse   Other: Patient guarded and did not answer further     PATIENT STRENGTHS: Other: Patient did not answer   PATIENT IDENTIFIED PROBLEMS: Plan to Overdose on pills                     DISCHARGE CRITERIA:  Improved stabilization in mood, thinking, and/or behavior Need for constant or close observation no longer present Reduction of life-threatening or endangering symptoms to within safe limits  PRELIMINARY DISCHARGE PLAN: Attend aftercare/continuing care group  PATIENT/FAMILY INVOLVEMENT: This treatment plan has been presented to and reviewed with the patient, Clifford Burgess.  The patient and family have been given the opportunity to ask questions and make suggestions.  [name], RN 11/18/2023, 5:33 PM

## 2023-11-18 NOTE — ED Notes (Signed)
Pt provided with breakfast tray and beverage

## 2023-11-18 NOTE — ED Notes (Signed)
EMTALA reviewed by Charge RN 

## 2023-11-18 NOTE — ED Notes (Signed)
 Attempted to call report to [facility]. Nurse receiving pt was unavailable. Was told that nurse will call this RN back.

## 2023-11-18 NOTE — BH Assessment (Signed)
 Patient has been accepted to [facility].  Patient assigned to room 406-2 Accepting physician is Dr. Marolyn Rosser.  Call report to [phone].  Representative was Linsey [facility]).   ER Staff is aware of it:  Nitchia, ER Secretary  Dr. Jossie, ER MD  Rosina, Patient's Nurse       Per THEOPOLIS Noberto): Pt must sign a voluntary consent and fax that to [phone] and send original with pt or use ECONSENT. DX Mood D.O. Ptsd. Bed is 406-2 and it is not yet ready but we are working on moving things around as pt is TRANS and will require a private bed. PLS call safe transport for pick up after the above completed to pick up at [phone]

## 2023-11-18 NOTE — Progress Notes (Addendum)
   11/18/23 2125  Psych Admission Type (Psych Patients Only)  Admission Status Voluntary  Psychosocial Assessment  Patient Complaints Other (Comment);Anxiety (stress)  Eye Contact Brief  Facial Expression Anxious;Sad  Affect Anxious  Speech Logical/coherent  Interaction Assertive;Guarded  Motor Activity Other (Comment) (wnl)  Appearance/Hygiene In scrubs  Behavior Characteristics Cooperative;Anxious  Mood Anxious;Sad  Thought Process  Coherency WDL  Content WDL  Delusions None reported or observed  Perception WDL  Hallucination None reported or observed  Judgment Poor  Confusion None  Danger to Self  Current suicidal ideation? Denies  Agreement Not to Harm Self Yes  Description of Agreement verbal  Danger to Others  Danger to Others None reported or observed   Progress note   D: Pt seen in hallway. Pt denies SI, HI, AVH. Pt rates pain  8/10 as a migraine. Pt rates anxiety  a little/10 and depression  0/10. States that she has mostly stress. The slight anxiety that she is experiencing is due to being in a new place. Pt states that she usually takes all of her medications together at night but they were all given to her at the hospital this morning. Pt spoke to her sister today and she dropped off the charger and case for pt's hearing aid. 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 Hearing aid charger case was labeled with pt sticker and hearing aid plugged up to recharge. Pt is guarded but appropriate. No other concerns noted at this time.  A: Pt provided support and encouragement. Pt given scheduled medication as prescribed. PRNs as appropriate. Q15 min checks for safety.   R: Pt safe on the unit. Will continue to monitor.

## 2023-11-18 NOTE — ED Notes (Signed)
 Pt provided with lunch tray.

## 2023-11-19 ENCOUNTER — Encounter (HOSPITAL_COMMUNITY): Payer: Self-pay

## 2023-11-19 DIAGNOSIS — Z8669 Personal history of other diseases of the nervous system and sense organs: Secondary | ICD-10-CM

## 2023-11-19 DIAGNOSIS — R7303 Prediabetes: Secondary | ICD-10-CM | POA: Insufficient documentation

## 2023-11-19 DIAGNOSIS — Z8709 Personal history of other diseases of the respiratory system: Secondary | ICD-10-CM

## 2023-11-19 LAB — LIPID PANEL
Cholesterol: 122 mg/dL (ref 0–200)
HDL: 47 mg/dL (ref >40)
LDL Cholesterol: 57 mg/dL (ref 0–99)
Total CHOL/HDL Ratio: 2.6 ratio
Triglycerides: 91 mg/dL (ref <150)
VLDL: 18 mg/dL (ref 0–40)

## 2023-11-19 LAB — HEMOGLOBIN A1C
Hgb A1c MFr Bld: 5.9 % — ABNORMAL HIGH (ref 4.8–5.6)
Mean Plasma Glucose: 122.63 mg/dL

## 2023-11-19 MED ORDER — TRAZODONE HCL 50 MG PO TABS
50.0000 mg | ORAL_TABLET | Freq: Every day | ORAL | Status: DC
  Administered 2023-11-19 – 2023-11-21 (×3): 50 mg via ORAL
  Filled 2023-11-19 (×3): qty 1

## 2023-11-19 MED ORDER — FLUOXETINE HCL 20 MG PO CAPS
20.0000 mg | ORAL_CAPSULE | Freq: Every day | ORAL | Status: DC
  Administered 2023-11-19 – 2023-11-22 (×4): 20 mg via ORAL
  Filled 2023-11-19 (×4): qty 1

## 2023-11-19 NOTE — Progress Notes (Signed)
 Pt states that she is still having migraine pain. At home just takes Topiramate  once a day. My doctor was going to try and get me something for breakthrough but my medicaid wouldn't pay for it. Encouraged pt to speak to provider today and ask for another migraine medication that can be taken for breakthrough migraine pain during the day. Pt was given Tylenol  650 mg last night and pt states that she was awake at 0230 sitting on the bed because her head was hurting.

## 2023-11-19 NOTE — Group Note (Signed)
 Date:  11/19/2023 Time:  4:32 PM  Group Topic/Focus: Occupational therapy    Occupational therapy in mental health focuses on helping clients develop skills, habits, and routines that support emotional well-being, social participation, and daily functioning. Group therapy provides a supportive environment for peer learning, connection, and practice of coping skills.  Participation Level:  Did Not Attend  Clifford Burgess 11/19/2023, 4:32 PM
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
## 2023-11-19 NOTE — BHH Suicide Risk Assessment (Addendum)
 Suicide Risk Assessment  Admission Assessment    [facility] Admission Suicide Risk Assessment   Nursing information obtained from:  Patient Demographic factors:  Caucasian, Gay, lesbian, or bisexual orientation Current Mental Status:  NA Loss Factors:  Loss of significant relationship Historical Factors:  NA Risk Reduction Factors:  Sense of responsibility to family, Positive social support  Total Time spent with patient: 1.5 hours Principal Problem: Mood disorder Diagnosis:  Principal Problem:   Mood disorder  Subjective Data: Clifford Burgess (she/her/hers) who prefers to be called Clifford Burgess is a 53 year old transgender male with a past psychiatric history significant for bipolar disorder (depressive) and polysubstance abuse. The patient was admitted following presentation to [facility] ED, accompanied by his sister, due to worsening depressive symptoms and active suicidal ideation with a plan to overdose. She reports one prior suicide attempt involving an overdose on Wellbutrin  and one previous psychiatric hospitalization in January 2020 related to that attempt.    Continued Clinical Symptoms:  Alcohol Use Disorder Identification Test Final Score (AUDIT): 0 The Alcohol Use Disorders Identification Test, Guidelines for Use in Primary Care, Second Edition.  World Science Writer [facility]). Score between 0-7:  no or low risk or alcohol related problems. Score between 8-15:  moderate risk of alcohol related problems. Score between 16-19:  high risk of alcohol related problems. Score 20 or above:  warrants further diagnostic evaluation for alcohol dependence and treatment.   CLINICAL FACTORS:   Severe Anxiety and/or Agitation Bipolar Disorder:   Depressive phase Depression:   Anhedonia Hopelessness Insomnia Recent sense of peace/wellbeing Severe Unstable or Poor Therapeutic Relationship Previous Psychiatric Diagnoses and Treatments Medical Diagnoses and  Treatments/Surgeries   Musculoskeletal: Strength & Muscle Tone: within normal limits Gait & Station: normal Patient leans: N/A  Psychiatric Specialty Exam:  Presentation  General Appearance: Appropriate for Environment; Casual  Eye Contact:Good  Speech:Clear and Coherent; Normal Rate  Speech Volume:Normal  Handedness:Right   Mood and Affect  Mood:Depressed  Affect:Congruent   Thought Process  Thought Processes:Coherent; Goal Directed  Descriptions of Associations:Intact  Orientation:Full (Time, Place and Person)  Thought Content:Logical  History of Schizophrenia/Schizoaffective disorder:No data recorded Duration of Psychotic Symptoms:No data recorded Hallucinations:Hallucinations: None  Ideas of Reference:None  Suicidal Thoughts:Suicidal Thoughts: No  Homicidal Thoughts:Homicidal Thoughts: No   Sensorium  Memory:Immediate Good; Recent Good  Judgment:Poor  Insight:Fair   Executive Functions  Concentration:No data recorded Attention Span:Fair  Recall:Good  Fund of Knowledge:Fair  Language:Fair   Psychomotor Activity  Psychomotor Activity:Psychomotor Activity: Normal   Assets  Assets:Communication Skills; Desire for Improvement; Housing; Resilience; Social Support   Sleep  Sleep:Sleep: Poor    Physical Exam: Physical Exam ROS Blood pressure 130/73, pulse 89, temperature 98 F (36.7 C), temperature source Oral, resp. rate 18, height 5' 8 (1.727 m), weight 82.1 kg. Body mass index is 27.52 kg/m.   COGNITIVE FEATURES THAT CONTRIBUTE TO RISK:  None    SUICIDE RISK:   Moderate:  Frequent suicidal ideation with limited intensity, and duration, some specificity in terms of plans, no associated intent, good self-control, limited dysphoria/symptomatology, some risk factors present, and identifiable protective factors, including available and accessible social support.  PLAN OF CARE: See H&P for assessment and plan.  I certify that  inpatient services furnished can reasonably be expected to improve the patient's condition.   Blair Chiquita Hint, NP 11/19/2023, 12:38 PM

## 2023-11-19 NOTE — Group Note (Signed)
 Date:  11/19/2023 Time:  10:18 AM  Group Topic/Focus:  Emotional Goals and Wellness orientation  Goals Group:   The focus of this group is to help patients establish daily goals to achieve during treatment and discuss how the patient can incorporate goal setting into their daily lives to aide in recovery. Orientation:   The focus of this group is to educate the patient on the purpose and policies of crisis stabilization and provide a format to answer questions about their admission.  The group details unit policies and expectations of patients while admitted.    Participation Level:  Minimal  Participation Quality:  Appropriate  Affect:  Appropriate  Cognitive:  Alert and Appropriate  Insight: Appropriate  Engagement in Group:  Engaged  Modes of Intervention:  Discussion and Orientation  Additional Comments:  Patient didn't really speak much. Still learninf the process.  Clifford Burgess M [name] 11/19/2023, 10:18 AM

## 2023-11-19 NOTE — H&P (Signed)
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 patient 1 puff daily for asthma, COPD  -- Continue Avodart capsule 0.5 mg oral daily  -- Continue estradiol  tablet 2 mg oral 2 times daily  -- Continue metformin  tablet 500 mg oral daily with breakfast, prediabetes  -- Continue Protonix  tablet 40 mg oral daily for GERD  -- Continue progesterone  capsule 200 mg oral daily  -- Continue Crestor  tablet 10 mg oral daily  -- Continue Topamax  tablet 100 mg oral daily for migraine  # Nicotine  Dependence  -- Nicotine  21 patch daily               -- Smoking cessation encouraged     4. Labs    -- CBC: Platelets 429, otherwise WNL             -- CMP: Blood glucose 115, otherwise WNL             -- Ethanol: <15              -- Lipid Panel: Unremarkable             -- HgBA1c: 5.9             -- UDS: Unremarkable             -- EKG: NSR   QT/QTc 384/414    -- The risks/benefits/side-effects/alternatives to this medication were discussed in detail with the patient and time was given for questions. The patient consents to medication trial.  -- FDA --  Metabolic profile and EKG monitoring obtained while on an atypical antipsychotic (BMI: Lipid Panel: HbgA1c: QTc:)               -- Encouraged patient to participate in unit milieu and in scheduled group therapies  -- Short Term Goals: Ability to identify changes in lifestyle to reduce recurrence of condition will improve, Ability to verbalize feelings will improve, Ability to disclose and discuss suicidal ideas, Ability to demonstrate self-control will improve, Ability to identify and develop effective coping behaviors will improve, Ability to maintain clinical measurements within normal limits will improve, Compliance with prescribed medications will improve, and Ability to identify triggers associated with substance abuse/mental health issues will improve             -- Long Term Goals: Improvement in symptoms so as ready for discharge     5. Discharge Planning:  -- Social work and case management to assist with discharge planning and identification of hospital follow-up needs prior to discharge -- Estimated LOS: 5-7 days -- Discharge Concerns: Need to establish a safety plan; Medication compliance and effectiveness -- Discharge Goals: Return home with outpatient referrals for mental health follow-up including medication management/psychotherapy      Physician Treatment Plan for Primary Diagnosis: Bipolar 1 disorder, depressed, severe (HCC) Long Term Goal(s): Improvement in symptoms so as ready for discharge  Short Term Goals: Ability to identify changes in lifestyle to reduce recurrence of condition will improve, Ability to verbalize feelings will improve,  Ability to disclose and discuss suicidal ideas, Ability to demonstrate self-control will improve, Ability to identify and develop effective coping behaviors will improve, Ability to maintain clinical measurements within normal limits will improve, Compliance with prescribed medications will improve, and Ability to identify triggers associated with substance abuse/mental health issues will improve  Physician Treatment Plan for Secondary Diagnosis: Principal Problem:   Bipolar 1 disorder, depressed, severe (HCC) Active Problems:   GERD (gastroesophageal reflux disease)   Nicotine  dependence   History of asthma   Prediabetes   History of migraine  Long Term Goal(s): Improvement in symptoms so as ready for discharge  Short Term Goals: Ability to identify changes in lifestyle to reduce recurrence of condition will improve, Ability to verbalize feelings will improve, Ability to disclose and discuss suicidal ideas, Ability to demonstrate self-control will improve, Ability to identify and develop effective coping behaviors will improve, Ability to maintain clinical measurements within normal limits will improve, Compliance with prescribed medications will improve, and Ability to identify triggers associated with substance abuse/mental health issues will improve  I certify that inpatient services furnished can reasonably be expected to improve the patient's condition.    Blair Chiquita Hint, NP 11/10/20253:37 PM

## 2023-11-19 NOTE — Group Note (Deleted)
 Date:  11/19/2023 Time:  9:59 AM  Group Topic/Focus:  Goals Group:   The focus of this group is to help patients establish daily goals to achieve during treatment and discuss how the patient can incorporate goal setting into their daily lives to aide in recovery. Orientation:   The focus of this group is to educate the patient on the purpose and policies of crisis stabilization and provide a format to answer questions about their admission.  The group details unit policies and expectations of patients while admitted.     Participation Level:  {BHH PARTICIPATION OZCZO:77735}  Participation Quality:  {B[facility] PARTICIPATION QUALITY:22265}  Affect:  {BHH AFFECT:22266}  Cognitive:  {BHH COGNITIVE:22267}  Insight: {BHH Insight2:20797}  Engagement in Group:  {BHH ENGAGEMENT IN HMNLE:77731}  Modes of Intervention:  {BHH MODES OF INTERVENTION:22269}  Additional Comments:  ***  Jontez Redfield M Japheth Diekman 11/19/2023, 9:59 AM

## 2023-11-19 NOTE — Group Note (Signed)
 Date:  11/19/2023 Time:  8:58 PM  Group Topic/Focus:  Wrap-Up Group:   The focus of this group is to help patients review their daily goal of treatment and discuss progress on daily workbooks.    Participation Level:  Did Not Attend  Participation Quality:  none  Affect:  n/a  Cognitive:  n/a  Insight: None  Engagement in Group:  None  Modes of Intervention:  none  Additional Comments:  Pt went to AA group  Kit Brubacher A Sigrid Schwebach 11/19/2023, 8:58 PM

## 2023-11-19 NOTE — Group Note (Signed)
 Recreation Therapy Group Note   Group Topic:Communication  Group Date: 11/19/2023 Start Time: 0935 End Time: 1000 Facilitators: Esmond Hinch-McCall, LRT,CTRS Location: [address] Dayroom   Group Topic: Communication, Team Building, Problem Solving  Goal Area(s) Addresses:  Patient will effectively work with peer towards shared goal.  Patient will identify skills used to make activity successful.  Patient will identify how skills used during activity can be applied to reach post d/c goals.   Behavioral Response: Attentive  Intervention: STEM Activity- Glass Blower/designer  Activity: Tallest [employer]. In teams of 5-6, patients were given 11 craft pipe cleaners. Using the materials provided, patients were instructed to compete again the opposing team(s) to build the tallest free-standing structure from floor level. The activity was timed; difficulty increased by clinical research associate as production designer, theatre/television/film continued.  Systematically resources were removed with additional directions for example, placing one arm behind their back, working in silence, and shape stipulations. LRT facilitated post-activity discussion reviewing team processes and necessary communication skills involved in completion. Patients were encouraged to reflect how the skills utilized, or not utilized, in this activity can be incorporated to positively impact support systems post discharge.  Education: Pharmacist, Community, Scientist, Physiological, Discharge Planning   Education Outcome: Acknowledges education/In group clarification offered/Needs additional education.    Affect/Mood: Appropriate   Participation Level: Moderate   Participation Quality: Independent   Behavior: Attentive    Speech/Thought Process: Focused   Insight: Moderate   Judgement: Moderate   Modes of Intervention: STEM Activity   Patient Response to Interventions:  Attentive   Education Outcome:  In group clarification offered    Clinical  Observations/Individualized Feedback: Pt was quiet but gave some assistance to peers in creating their tower. Pt was also attentive to the suggestions and creations of peer as well.      Plan: Continue to engage patient in RT group sessions 2-3x/week.   Alitzel Cookson-McCall, LRT,CTRS  11/19/2023 11:51 AM

## 2023-11-19 NOTE — Group Note (Signed)
 Date:  11/19/2023 Time:  3:54 PM  Group Topic/Focus: [place] Spirituality:   The focus of this group is to discuss how one's spirituality can aide in recovery.    Participation Level:  Did Not Attend   Dolores CHRISTELLA Fredericks 11/19/2023, 3:54 PM

## 2023-11-19 NOTE — BH IP Treatment Plan (Signed)
 Interdisciplinary Treatment and Diagnostic Plan Update  11/19/2023 Time of Session: 11:00 Clifford Burgess MRN: [id number]  Principal Diagnosis: Mood disorder  Secondary Diagnoses: Principal Problem:   Mood disorder   Current Medications:  Current Facility-Administered Medications  Medication Dose Route Frequency Provider Last Rate Last Admin   acetaminophen  (TYLENOL ) tablet 650 mg  650 mg Oral Q6H PRN Jadapalle, Sree, MD   650 mg at 11/18/23 2152   albuterol  (PROVENTIL ) (2.5 MG/3ML) 0.083% nebulizer solution 2.5 mg  2.5 mg Inhalation Q4H PRN Jadapalle, Sree, MD       alum & mag hydroxide-simeth (MAALOX/MYLANTA) 200-200-20 MG/5ML suspension 30 mL  30 mL Oral Q4H PRN Jadapalle, Sree, MD       haloperidol (HALDOL) tablet 5 mg  5 mg Oral TID PRN Jadapalle, Sree, MD       And   diphenhydrAMINE  (BENADRYL ) capsule 50 mg  50 mg Oral TID PRN Jadapalle, Sree, MD       haloperidol lactate (HALDOL) injection 5 mg  5 mg Intramuscular TID PRN Jadapalle, Sree, MD       And   diphenhydrAMINE  (BENADRYL ) injection 50 mg  50 mg Intramuscular TID PRN Jadapalle, Sree, MD       And   LORazepam  (ATIVAN ) injection 2 mg  2 mg Intramuscular TID PRN Jadapalle, Sree, MD       haloperidol lactate (HALDOL) injection 10 mg  10 mg Intramuscular TID PRN Jadapalle, Sree, MD       And   diphenhydrAMINE  (BENADRYL ) injection 50 mg  50 mg Intramuscular TID PRN Jadapalle, Sree, MD       And   LORazepam  (ATIVAN ) injection 2 mg  2 mg Intramuscular TID PRN Jadapalle, Sree, MD       dutasteride (AVODART) capsule 0.5 mg  0.5 mg Oral Daily Jadapalle, Sree, MD   0.5 mg at 11/19/23 0846   estradiol  (ESTRACE ) tablet 2 mg  2 mg Oral BID Jadapalle, Sree, MD   2 mg at 11/19/23 0846   fluticasone furoate-vilanterol (BREO ELLIPTA) 100-25 MCG/ACT 1 puff  1 puff Inhalation Daily Jadapalle, Sree, MD       hydrOXYzine  (ATARAX ) tablet 50 mg  50 mg Oral Q6H PRN Jadapalle, Sree, MD       magnesium  hydroxide (MILK OF MAGNESIA)  suspension 30 mL  30 mL Oral Daily PRN Donnelly Mellow, MD       metFORMIN  (GLUCOPHAGE ) tablet 500 mg  500 mg Oral Q breakfast Jadapalle, Sree, MD   500 mg at 11/19/23 0845   nicotine  (NICODERM CQ  - dosed in mg/24 hours) patch 21 mg  21 mg Transdermal Daily Jadapalle, Sree, MD   21 mg at 11/19/23 0843   pantoprazole  (PROTONIX ) EC tablet 40 mg  40 mg Oral Daily Jadapalle, Sree, MD   40 mg at 11/19/23 0845   progesterone  (PROMETRIUM ) capsule 200 mg  200 mg Oral Daily Jadapalle, Sree, MD   200 mg at 11/19/23 0844   rosuvastatin  (CRESTOR ) tablet 10 mg  10 mg Oral Daily Jadapalle, Sree, MD   10 mg at 11/19/23 0845   topiramate  (TOPAMAX ) tablet 100 mg  100 mg Oral Daily Jadapalle, Sree, MD   100 mg at 11/19/23 0846   umeclidinium bromide (INCRUSE ELLIPTA) 62.5 MCG/ACT 1 puff  1 puff Inhalation Daily Jadapalle, Sree, MD       PTA Medications: Medications Prior to Admission  Medication Sig Dispense Refill Last Dose/Taking   albuterol  (VENTOLIN  HFA) 108 (90 Base) MCG/ACT inhaler TAKE 2 PUFFS BY MOUTH  EVERY 6 HOURS AS NEEDED FOR WHEEZE OR SHORTNESS OF BREATH 18 each 3 Past Month   dutasteride (AVODART) 0.5 MG capsule Take 0.5 mg by mouth daily.   Past Week   estradiol  (ESTRACE ) 1 MG tablet Take 2 mg by mouth 2 (two) times daily.   Past Week   meclizine  (ANTIVERT ) 25 MG tablet TAKE 1 TABLET BY MOUTH 3 TIMES DAILY AS NEEDED FOR DIZZINESS. 90 tablet 0 Past Week   metFORMIN  (GLUCOPHAGE ) 500 MG tablet Take 1 tablet (500 mg total) by mouth daily with breakfast. 90 tablet 1 Past Week   omeprazole  (PRILOSEC) 20 MG capsule TAKE 1 CAPSULE BY MOUTH EVERY DAY 90 capsule 1 Past Week   ondansetron  (ZOFRAN -ODT) 4 MG disintegrating tablet Take 1 tablet (4 mg total) by mouth every 8 (eight) hours as needed for nausea or vomiting. 20 tablet 0 Past Week   progesterone  (PROMETRIUM ) 200 MG capsule SMARTSIG:200 Milligram(s) By Mouth Every Evening   Past Week   rosuvastatin  (CRESTOR ) 10 MG tablet TAKE 1 TABLET BY MOUTH EVERY DAY  30 tablet 5 Past Week   topiramate  (TOPAMAX ) 25 MG tablet Take 1 tablet (25 mg total) by mouth daily. (Patient taking differently: Take 100 mg by mouth daily.) 30 tablet 1 Past Week   Accu-Chek Softclix Lancets lancets Use as instructed 100 each 12    aspirin EC 81 MG tablet Take 81 mg by mouth daily. (Patient not taking: Reported on 11/18/2023)   Not Taking   Blood Glucose Monitoring Suppl (ACCU-CHEK GUIDE ME) w/Device KIT Use as directed. E11.65 1 kit 0    Continuous Glucose Sensor (DEXCOM G7 SENSOR) MISC USE AS DIRECTED EVERY 10 DAYS 3 each 3    glucose blood test strip Use as instructed 100 each 12    ipratropium-albuterol  (DUONEB) 0.5-2.5 (3) MG/3ML SOLN INHALE 3 ML (1 VIAL) BY NEBULIZER EVERY 6 HOURS AS NEEDED (Patient not taking: Reported on 11/18/2023) 360 mL 1 Not Taking   metoprolol tartrate (LOPRESSOR) 50 MG tablet Take 50 mg by mouth 2 (two) times daily. (Patient not taking: Reported on 11/18/2023)   Not Taking   mometasone -formoterol  (DULERA) 100-5 MCG/ACT AERO Inhale 2 puffs into the lungs 2 (two) times daily. (Patient not taking: Reported on 11/18/2023) 13 g 11 Not Taking   OXYGEN  Inhale 2 L into the lungs. At night pt uses 24 hrs from [facility] Pt      Tiotropium Bromide Monohydrate  (SPIRIVA  RESPIMAT) 2.5 MCG/ACT AERS Inhale 2 puffs into the lungs daily. (Patient not taking: Reported on 11/18/2023) 4 g 6 Not Taking    Patient Stressors: Substance abuse   Other: Patient guarded and did not answer further    Patient Strengths: Other: Patient did not answer  Treatment Modalities: Medication Management, Group therapy, Case management,  1 to 1 session with clinician, Psychoeducation, Recreational therapy.   Physician Treatment Plan for Primary Diagnosis: Mood disorder Long Term Goal(s): Improvement in symptoms so as ready for discharge   Short Term Goals: Ability to identify changes in lifestyle to reduce recurrence of condition will improve Ability to verbalize feelings will  improve Ability to disclose and discuss suicidal ideas Ability to demonstrate self-control will improve Ability to identify and develop effective coping behaviors will improve Ability to maintain clinical measurements within normal limits will improve Compliance with prescribed medications will improve Ability to identify triggers associated with substance abuse/mental health issues will improve  Medication Management: Evaluate patient's response, side effects, and tolerance of medication regimen.  Therapeutic Interventions: 1  to 1 sessions, Unit Group sessions and Medication administration.  Evaluation of Outcomes: Not Progressing  Physician Treatment Plan for Secondary Diagnosis: Principal Problem:   Mood disorder  Long Term Goal(s): Improvement in symptoms so as ready for discharge   Short Term Goals: Ability to identify changes in lifestyle to reduce recurrence of condition will improve Ability to verbalize feelings will improve Ability to disclose and discuss suicidal ideas Ability to demonstrate self-control will improve Ability to identify and develop effective coping behaviors will improve Ability to maintain clinical measurements within normal limits will improve Compliance with prescribed medications will improve Ability to identify triggers associated with substance abuse/mental health issues will improve     Medication Management: Evaluate patient's response, side effects, and tolerance of medication regimen.  Therapeutic Interventions: 1 to 1 sessions, Unit Group sessions and Medication administration.  Evaluation of Outcomes: Not Progressing   RN Treatment Plan for Primary Diagnosis: Mood disorder Long Term Goal(s): Knowledge of disease and therapeutic regimen to maintain health will improve  Short Term Goals: Ability to demonstrate self-control, Ability to participate in decision making will improve, and Ability to verbalize feelings will improve  Medication  Management: RN will administer medications as ordered by provider, will assess and evaluate patient's response and provide education to patient for prescribed medication. RN will report any adverse and/or side effects to prescribing provider.  Therapeutic Interventions: 1 on 1 counseling sessions, Psychoeducation, Medication administration, Evaluate responses to treatment, Monitor vital signs and CBGs as ordered, Perform/monitor CIWA, COWS, AIMS and Fall Risk screenings as ordered, Perform wound care treatments as ordered.  Evaluation of Outcomes: Not Progressing   LCSW Treatment Plan for Primary Diagnosis: Mood disorder Long Term Goal(s): Safe transition to appropriate next level of care at discharge, Engage patient in therapeutic group addressing interpersonal concerns.  Short Term Goals: Engage patient in aftercare planning with referrals and resources, Increase ability to appropriately verbalize feelings, Increase emotional regulation, and Facilitate acceptance of mental health diagnosis and concerns  Therapeutic Interventions: Assess for all discharge needs, 1 to 1 time with Social worker, Explore available resources and support systems, Assess for adequacy in community support network, Educate family and significant other(s) on suicide prevention, Complete Psychosocial Assessment, Interpersonal group therapy.  Evaluation of Outcomes: Not Progressing   Progress in Treatment: Attending groups: No. Participating in groups: No. Taking medication as prescribed: Yes. Toleration medication: Yes. Family/Significant other contact made: No, will contact:  will determine at PSA Patient understands diagnosis: Yes. Discussing patient identified problems/goals with staff: Yes. Medical problems stabilized or resolved: Yes. Denies suicidal/homicidal ideation: Yes. Issues/concerns per patient self-inventory: No. Other: n/a  New problem(s) identified: No, Describe:  n/a  New Short Term/Long Term  Goal(s):  Patient Goals:  get better  Discharge Plan or Barriers: n/a  Reason for Continuation of Hospitalization: Depression  Estimated Length of Stay: -7 days  Last 3 [place] Suicide Severity Risk Score: Flowsheet Row Ad[facility] (Current) [facility]17[facility]20[facility] from [facility]/17/2023 in [facility]   C-SSRS RISK CATEGORY High Risk High Risk No Risk    Last PHQ 2/9 Scores:    08/16/2023   11:13 AM 05/10/2023   10:37 AM 03/01/2023   10:09 AM  Depression screen PHQ 2/9  Decreased Interest 1 0 0  Down, Depressed, Hopeless 0 0 0  PHQ - 2 Score 1 0 0    Scribe for Treatment Team:  Nat CHRISTELLA Salter, LCSW 11/19/2023 1:55 PM

## 2023-11-19 NOTE — Group Note (Signed)
 Date:  11/19/2023 Time:  11:10 AM  Group Topic/Focus: Recreational Therapy    this activity helps patients practice communication, critical thinking, and teamwork. It encourages collaborative problem-solving and the need for compromise, as participants work together to achieve a shared goal.  Participation Level:  Active  Additional Comments:  Patient attended  Clifford Burgess 11/19/2023, 11:10 AM

## 2023-11-19 NOTE — BHH Group Notes (Signed)
 BHH Group Notes:  (Nursing/MHT/Case Management/Adjunct)  Date:  11/19/2023  Time:  2000  Type of Therapy:  Alcoholics Anonymous Meeting  Participation Level:  Active  Participation Quality:  Appropriate, Attentive, and Supportive  Affect:  Depressed  Cognitive:  Alert  Insight:  Improving  Engagement in Group:  Engaged  Modes of Intervention:  Clarification, Education, and Support  Summary of Progress/Problems:  Lenora Manuelita RAMAN 11/19/2023, 9:27 PM

## 2023-11-19 NOTE — Progress Notes (Signed)
 Pt signed 72 hour form at 14:40.

## 2023-11-19 NOTE — Plan of Care (Signed)
  Problem: Education: Goal: Emotional status will improve Outcome: Progressing Goal: Mental status will improve Outcome: Progressing   Problem: Activity: Goal: Interest or engagement in activities will improve Outcome: Progressing Goal: Sleeping patterns will improve Outcome: Progressing   Problem: Physical Regulation: Goal: Ability to maintain clinical measurements within normal limits will improve Outcome: Progressing   Problem: Safety: Goal: Periods of time without injury will increase Outcome: Progressing

## 2023-11-19 NOTE — Group Note (Signed)
 Date:  11/19/2023 Time:  11:47 AM  Group Topic/Focus: Emotional physical wellness  These two aspects are intertwined; emotional distress can affect the body, while physical health problems can affect emotional well-being.  Participation Level:  Did Not Attend  Clifford Burgess 11/19/2023, 11:47 AM

## 2023-11-19 NOTE — Plan of Care (Signed)
 Pt was out in the milieu during the day acting appropriately. Went to [employer] and ate adequately. Attending group activity and participated. Denies SI/HI/SH/paranoia/AVH. Will continue to monitor.

## 2023-11-20 NOTE — Plan of Care (Signed)
   Problem: Education: Goal: Emotional status will improve Outcome: Progressing Goal: Mental status will improve Outcome: Progressing Goal: Verbalization of understanding the information provided will improve Outcome: Progressing   Problem: Activity: Goal: Interest or engagement in activities will improve Outcome: Progressing

## 2023-11-20 NOTE — Group Note (Signed)
 Date:  11/20/2023 Time:  9:50 AM  Group Topic/Focus: Goals group  Patients participated in a goals-focused group aimed at increasing self-awareness, motivation, and future planning. 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 Patients then completed a goal-setting worksheet outlining short- and long-term goals (1 week, 1 month, 1 year, and 5 years). Group discussion centered on sharing individual goals, identifying potential obstacles, and exploring coping strategies and problem-solving methods to overcome barriers. Patients were encouraged to use realistic, measurable goals and to practice self-compassion in the goal-setting process.   Participation Level:  Minimal  Participation Quality:  Appropriate, Attentive, and Resistant  Affect:  Flat and Resistant  Cognitive:  Alert and Appropriate  Insight: Improving and Lacking  Engagement in Group:  Improving, Lacking, and Resistant  Modes of Intervention:  Discussion, Exploration, and Socialization  Additional Comments:  Clifford Burgess attended goals group with minimal participation. Clifford Burgess participated in the icebreaker but declined to participate in sharing her goals.  Clifford Burgess [name] 11/20/2023, 9:50 AM

## 2023-11-20 NOTE — BHH Suicide Risk Assessment (Signed)
 BHH INPATIENT:  Family/Significant Other Suicide Prevention Education  Suicide Prevention Education:  Contact Attempts: Verneita Southerly (sister) [phone], has been identified by the patient as the family member/significant other with whom the patient will be residing, and identified as the person(s) who will aid the patient in the event of a mental health crisis.  With written consent from the patient, two attempts were made to provide suicide prevention education, prior to and/or following the patient's discharge.  We were unsuccessful in providing suicide prevention education.  A suicide education pamphlet was given to the patient to share with family/significant other.  Date and time of first attempt: 11/20/23 at 10:52 am   Roselyn GORMAN Lento 11/20/2023, 10:52 AM

## 2023-11-20 NOTE — Progress Notes (Signed)
(  Sleep Hours) -7.5  (Any PRNs that were needed, meds refused, or side effects to meds)-hydroxyzine  50mg    (Any disturbances and when (visitation, over night)-none  (Concerns raised by the patient)- none  (SI/HI/AVH)-denies

## 2023-11-20 NOTE — [facility] Group Notes (Signed)
 Clifford Burgess did not attend the pet therapy group today 11/20/23 [phone]).

## 2023-11-20 NOTE — [facility] Group Notes (Signed)
 Adult Psychoeducational Group Note  Date:  11/20/2023 Time:  9:42 PM  Group Topic/Focus:  Wrap-Up Group:   The focus of this group is to help patients review their daily goal of treatment and discuss progress on daily workbooks.  Participation Level:  Active  Participation Quality:  Attentive  Affect:  Appropriate  Cognitive:  Alert  Insight: Appropriate  Engagement in Group:  Engaged  Modes of Intervention:  Discussion  Additional Comments:  Patient attended and participated in the Wrap-up group.  Clifford Burgess 11/20/2023, 9:42 PM

## 2023-11-20 NOTE — Progress Notes (Signed)
   11/20/23 1200  Psych Admission Type (Psych Patients Only)  Admission Status Voluntary/72 hour document signed  Psychosocial Assessment  Patient Complaints None  Eye Contact Fair  Facial Expression Flat  Affect Appropriate to circumstance  Speech Logical/coherent  Interaction Assertive  Motor Activity Slow  Appearance/Hygiene Unremarkable  Behavior Characteristics Cooperative  Mood Pleasant  Thought Process  Coherency WDL  Content WDL  Delusions None reported or observed  Perception WDL  Hallucination None reported or observed  Judgment Poor  Confusion WDL  Danger to Self  Current suicidal ideation? Denies  Description of Suicide Plan No Plan  Agreement Not to Harm Self Yes  Description of Agreement Verbal  Danger to Others  Danger to Others None reported or observed

## 2023-11-20 NOTE — Plan of Care (Signed)
   Problem: Education: Goal: Emotional status will improve Outcome: Progressing Goal: Mental status will improve Outcome: Progressing

## 2023-11-20 NOTE — Group Note (Signed)
 LCSW Group Therapy Note   Group Date: 11/20/2023 Start Time: 1100 End Time: 1200   Participation:  did not attend  Type of Therapy:  Group Therapy   Topic:  Understanding Your Path to Change  Objective:  The goal is to help individuals understand the stages of change, identify where they currently are in the process, and provide actionable next steps to continue moving forward in their journey of change.  Goals: - Learn about the six stages of change:  Precontemplation, Contemplation, Preparation, Action, Maintenance, and Relapse - Reflect on Current Change Efforts:  Recognize which stage participants are in regarding a personal change. - Plan Next Steps for Moving Forward:  Create an action plan based on their current stage of change.  Summary:  In this session, we explored the Stages of Change as a framework to understand the process of change.  We discussed how each stage helps individuals recognize where they are in their personal journey and used the Stages of Change Worksheet for self-reflection. Participants answered questions to better understand their current stage, challenges, and progress. We also emphasized the importance of moving forward, even if setbacks (Relapse) occur, and created actionable steps to help participants continue progressing. By the end of the session, participants gained a clearer understanding of their path to change and left with a clear plan for next steps.  Modalities:  Elements of CBT (cognitive restructuring, problem solving)  Element of DBT (mindfulness, distress tolerance)   Clifford Burgess, LCSWA 11/20/2023  12:36 PM

## 2023-11-20 NOTE — Progress Notes (Signed)
(  Sleep Hours) -10.75 (Any PRNs that were needed, meds refused, or side effects to meds)- none  (Any disturbances and when (visitation, over night)-none  (Concerns raised by the patient)- none , Ask me 3 discussed with pt and verbal understanding stated.    (SI/HI/AVH)- denies

## 2023-11-20 NOTE — Plan of Care (Signed)
   Problem: Education: Goal: Emotional status will improve Outcome: Progressing Goal: Mental status will improve Outcome: Progressing   Problem: Activity: Goal: Interest or engagement in activities will improve Outcome: Progressing

## 2023-11-20 NOTE — Group Note (Signed)
 Date:  11/20/2023 Time:  4:52 PM  Group Topic/Focus:  Making Healthy Choices:   The focus of this group is to help patients identify negative/unhealthy choices they were using prior to admission and identify positive/healthier coping strategies to replace them upon discharge. Self Care:   The focus of this group is to help patients understand the importance of self-care in order to improve or restore emotional, physical, spiritual, interpersonal, and financial health.    Participation Level:  Active  Participation Quality:  Appropriate  Affect:  Appropriate  Cognitive:  Appropriate  Insight: Appropriate  Engagement in Group:  Engaged  Modes of Intervention:  Discussion and Education  Additional Comments:    Juliene CHRISTELLA Huddle 11/20/2023, 4:52 PM

## 2023-11-20 NOTE — [facility] Group Notes (Signed)
 [name] did not attend the social work group today 11/20/23 (1100-1150).

## 2023-11-20 NOTE — Group Note (Signed)
 Recreation Therapy Group Note   Group Topic:Animal Assisted Therapy   Group Date: 11/20/2023 Start Time: 0950 End Time: 1030 Facilitators: Mystic Labo-McCall, LRT,CTRS Location: [address] Dayroom   Animal-Assisted Activity (AAA) Program Checklist/Progress Notes Patient Eligibility Criteria Checklist & Daily Group note for Rec Tx Intervention  AAA/T Program Assumption of Risk Form signed by Patient/ or Parent Legal Guardian Yes  Patient is free of allergies or severe asthma Yes  Patient reports no fear of animals Yes  Patient reports no history of cruelty to animals Yes  Patient understands his/her participation is voluntary Yes  Patient washes hands before animal contact Yes  Patient washes hands after animal contact Yes  Behavioral Response:    Education: Hand Washing, Appropriate Animal Interaction   Education Outcome: Acknowledges education.    Affect/Mood: N/A   Participation Level: Did not attend    Clinical Observations/Individualized Feedback:      Plan: Continue to engage patient in RT group sessions 2-3x/week.   Lurline Caver-McCall, LRT,CTRS 11/20/2023 12:15 PM

## 2023-11-20 NOTE — Progress Notes (Signed)
 [facility] MD Progress Note  11/20/2023 8:12 PM Clifford Burgess  MRN:  [id number]  Principal Problem: Bipolar 1 disorder, depressed, severe (HCC) Diagnosis: Principal Problem:   Bipolar 1 disorder, depressed, severe (HCC) Active Problems:   GERD (gastroesophageal reflux disease)   Nicotine  dependence   History of asthma   Prediabetes   History of migraine   Clifford Burgess (she/her/hers) who prefers to be called Clifford Burgess is a 53 year old transgender male with a past psychiatric history significant for bipolar disorder (depressive) and prior polysubstance abuse. The patient was admitted following presentation to [facility] ED, accompanied by his sister, due to worsening depressive symptoms and active suicidal ideation with a plan to overdose. She reports one prior suicide attempt involving an overdose on Wellbutrin  and one previous psychiatric hospitalization in January 2020 related to that attempt.     24-hour Chart Review: Chart reviewed. Patient's case discussed in interdisciplinary team meeting.  Vital signs reviewed without critical value. No as needed psychotropic medication required overnight.  She slept a documented 10.75 hours. She is adherent to taking psychotropic medication regimen.    Subjective: On assessment today, the patient was observed resting in bed and reports her mood as "good." She states that she signed a 72-hour request for discharge yesterday. She reports improvement in sleep, noting that trazodone was effective in helping her sleep. She reports her appetite as adequate, energy level as "alright," and concentration as improving. The patient denies suicidal ideation, passive thoughts of death, self-harm urges, homicidal ideation, or psychotic symptoms. She states that her anxiety and depression symptoms are improving. She reports no side effects from her current psychiatric medications and appears to be tolerating Prozac well. The patient mentioned speaking with her  sister by phone this morning and attending unit groups, which she finds beneficial. She continues to express interest in participating in outpatient therapy following discharge. She denies any physical complaints.   Total Time spent with patient: 45 minutes  Past Medical History:  Past Medical History:  Diagnosis Date   Asthma    COPD (chronic obstructive pulmonary disease) (HCC)    GERD (gastroesophageal reflux disease)     Past Surgical History:  Procedure Laterality Date   HERNIA REPAIR     Family History:  Family History  Problem Relation Age of Onset   Hypertension Father    Family Psychiatric  History: See H&P  Social History:  Social History   Substance and Sexual Activity  Alcohol Use Not Currently   Alcohol/week: 2.0 standard drinks of alcohol   Types: 2 Cans of beer per week   Comment: occasional     Social History   Substance and Sexual Activity  Drug Use Yes   Types: Cocaine, Marijuana, Heroin, LSD, Crack cocaine   Comment: 11/10/21  Pt reports drug use last week    Social History   Socioeconomic History   Marital status: Divorced    Spouse name: Not on file   Number of children: 2   Years of education: Not on file   Highest education level: 8th grade  Occupational History   Not on file  Tobacco Use   Smoking status: Some Days    Current packs/day: 1.00    Average packs/day: 1 pack/day for 36.0 years (36.0 ttl pk-yrs)    Types: Cigarettes   Smokeless tobacco: Former    Types: Chew   Tobacco comments:    1/2 - 1 pack a day.  Started smoking age 45./3 a day  Vaping Use  Vaping status: Never Used  Substance and Sexual Activity   Alcohol use: Not Currently    Alcohol/week: 2.0 standard drinks of alcohol    Types: 2 Cans of beer per week    Comment: occasional   Drug use: Yes    Types: Cocaine, Marijuana, Heroin, LSD, Crack cocaine    Comment: 11/10/21  Pt reports drug use last week   Sexual activity: Not Currently  Other Topics  Concern   Not on file  Social History Narrative   Not on file   Social Drivers of Health   Financial Resource Strain: High Risk (09/28/2023)   Received from [facility]   Overall Financial Resource Strain (CARDIA)    Difficulty of Paying Living Expenses: Hard  Food Insecurity: Food Insecurity Present (11/18/2023)   Hunger Vital Sign    Worried About Running Out of Food in the Last Year: Often true    Ran Out of Food in the Last Year: Often true  Transportation Needs: No Transportation Needs (11/18/2023)   PRAPARE - Administrator, Civil Service (Medical): No    Lack of Transportation (Non-Medical): No  Recent Concern: Transportation Needs - Unmet Transportation Needs (09/28/2023)   Received from [facility] - Transportation    In the past 12 months, has lack of transportation kept you from medical appointments or from getting medications?: No    Lack of Transportation (Non-Medical): Yes  Physical Activity: Insufficiently Active (08/02/2023)   Received from [facility]   Exercise Vital Sign    On average, how many days per week do you engage in moderate to strenuous exercise (like a brisk walk)?: 7 days    On average, how many minutes do you engage in exercise at this level?: 10 min  Stress: Stress Concern Present (08/02/2023)   Received from [facility] - Occupational Stress Questionnaire    Feeling of Stress : Very much  Social Connections: Unknown (08/02/2023)   Received from [facility]   Social Connection and Isolation Panel    In a typical week, how many times do you talk on the phone with family, friends, or neighbors?: More than three times a week    How often do you get together with friends or relatives?: More than three times a week    Attends Religious Services: Not on file    Do you belong to any clubs or organizations  such as church groups, unions, fraternal or athletic groups, or school groups?: No    How often do you attend meetings of the clubs or organizations you belong to?: Never    Are you married, widowed, divorced, separated, never married, or living with a partner?: Never married   Additional Social History:                        Current Medications: Current Facility-Administered Medications  Medication Dose Route Frequency Provider Last Rate Last Admin   acetaminophen  (TYLENOL ) tablet 650 mg  650 mg Oral Q6H PRN Jadapalle, Sree, MD   650 mg at 11/18/23 2152   albuterol  (PROVENTIL ) (2.5 MG/3ML) 0.083% nebulizer solution 2.5 mg  2.5 mg Inhalation Q4H PRN Jadapalle, Sree, MD       alum & mag hydroxide-simeth (MAALOX/MYLANTA) 200-200-20 MG/5ML suspension 30 mL  30 mL Oral Q4H PRN Jadapalle, Sree, MD       haloperidol (HALDOL)  tablet 5 mg  5 mg Oral TID PRN Jadapalle, Sree, MD       And   diphenhydrAMINE  (BENADRYL ) capsule 50 mg  50 mg Oral TID PRN Jadapalle, Sree, MD       haloperidol lactate (HALDOL) injection 5 mg  5 mg Intramuscular TID PRN Jadapalle, Sree, MD       And   diphenhydrAMINE  (BENADRYL ) injection 50 mg  50 mg Intramuscular TID PRN Jadapalle, Sree, MD       And   LORazepam  (ATIVAN ) injection 2 mg  2 mg Intramuscular TID PRN Jadapalle, Sree, MD       haloperidol lactate (HALDOL) injection 10 mg  10 mg Intramuscular TID PRN Jadapalle, Sree, MD       And   diphenhydrAMINE  (BENADRYL ) injection 50 mg  50 mg Intramuscular TID PRN Jadapalle, Sree, MD       And   LORazepam  (ATIVAN ) injection 2 mg  2 mg Intramuscular TID PRN Jadapalle, Sree, MD       dutasteride (AVODART) capsule 0.5 mg  0.5 mg Oral Daily Jadapalle, Sree, MD   0.5 mg at 11/20/23 9166   estradiol  (ESTRACE ) tablet 2 mg  2 mg Oral BID Jadapalle, Sree, MD   2 mg at 11/20/23 1647   FLUoxetine (PROZAC) capsule 20 mg  20 mg Oral Daily Asna Muldrow H, NP   20 mg at 11/20/23 0833   fluticasone furoate-vilanterol  (BREO ELLIPTA) 100-25 MCG/ACT 1 puff  1 puff Inhalation Daily Jadapalle, Sree, MD       hydrOXYzine  (ATARAX ) tablet 50 mg  50 mg Oral Q6H PRN Jadapalle, Sree, MD       magnesium  hydroxide (MILK OF MAGNESIA) suspension 30 mL  30 mL Oral Daily PRN Jadapalle, Sree, MD       metFORMIN  (GLUCOPHAGE ) tablet 500 mg  500 mg Oral Q breakfast Jadapalle, Sree, MD   500 mg at 11/20/23 9166   nicotine  (NICODERM CQ  - dosed in mg/24 hours) patch 21 mg  21 mg Transdermal Daily Jadapalle, Sree, MD   21 mg at 11/20/23 0836   pantoprazole  (PROTONIX ) EC tablet 40 mg  40 mg Oral Daily Jadapalle, Sree, MD   40 mg at 11/20/23 9166   progesterone  (PROMETRIUM ) capsule 200 mg  200 mg Oral Daily Jadapalle, Sree, MD   200 mg at 11/20/23 9165   rosuvastatin  (CRESTOR ) tablet 10 mg  10 mg Oral Daily Jadapalle, Sree, MD   10 mg at 11/20/23 9166   topiramate  (TOPAMAX ) tablet 100 mg  100 mg Oral Daily Jadapalle, Sree, MD   100 mg at 11/20/23 9166   traZODone (DESYREL) tablet 50 mg  50 mg Oral QHS Edelmira Gallogly H, NP   50 mg at 11/19/23 2122   umeclidinium bromide (INCRUSE ELLIPTA) 62.5 MCG/ACT 1 puff  1 puff Inhalation Daily Donnelly Mellow, MD        Lab Results:  Results for orders placed or performed during the hospital encounter of 11/18/23 (from the past 48 hours)  Hemoglobin A1c     Status: Abnormal   Collection Time: 11/19/23  6:33 AM  Result Value Ref Range   Hgb A1c MFr Bld 5.9 (H) 4.8 - 5.6 %    Comment: (NOTE) Diagnosis of Diabetes The following HbA1c ranges recommended by the American Diabetes Association (ADA) may be used as an aid in the diagnosis of diabetes mellitus.  Hemoglobin             Suggested A1C NGSP%  Diagnosis  <5.7                   Non Diabetic  5.7-6.4                Pre-Diabetic  >6.4                   Diabetic  <7.0                   Glycemic control for                       adults with diabetes.     Mean Plasma Glucose 122.63 mg/dL    Comment: Performed at [facility], [address], [place], [place] [address]  Lipid panel     Status: None   Collection Time: 11/19/23  6:33 AM  Result Value Ref Range   Cholesterol 122 0 - 200 mg/dL    Comment:        ATP III CLASSIFICATION:  <200     mg/dL   Desirable  799-760  mg/dL   Borderline High  >=759    mg/dL   High           Triglycerides 91 <150 mg/dL   HDL 47 >59 mg/dL   Total CHOL/HDL Ratio 2.6 RATIO   VLDL 18 0 - 40 mg/dL   LDL Cholesterol 57 0 - 99 mg/dL    Comment:        Total Cholesterol/HDL:CHD Risk Coronary Heart Disease Risk Table                     Men   Women  1/2 Average Risk   3.4   3.3  Average Risk       5.0   4.4  2 X Average Risk   9.6   7.1  3 X Average Risk  23.4   11.0        Use the calculated Patient Ratio above and the CHD Risk Table to determine the patient's CHD Risk.        ATP III CLASSIFICATION (LDL):  <100     mg/dL   Optimal  899-870  mg/dL   Near or Above                    Optimal  130-159  mg/dL   Borderline  839-810  mg/dL   High  >809     mg/dL   Very High Performed at [facility], [address], [place], [place] [address]     Blood Alcohol level:  Lab Results  Component Value Date   [facility] <15 11/17/2023   ETH <10 02/17/2021    Metabolic Disorder Labs: Lab Results  Component Value Date   HGBA1C 5.9 (H) 11/19/2023   MPG 122.63 11/19/2023   MPG 137 05/21/2023   No results found for: PROLACTIN Lab Results  Component Value Date   CHOL 122 11/19/2023   TRIG 91 11/19/2023   HDL 47 11/19/2023   CHOLHDL 2.6 11/19/2023   VLDL 18 11/19/2023   LDLCALC 57 11/19/2023   LDLCALC 127 (H) 08/07/2023    Physical Findings: AIMS:  ,  ,  ,  ,  ,  ,   CIWA:    COWS:     Musculoskeletal: Strength & Muscle Tone: within normal limits Gait & Station: normal Patient leans: N/A  Psychiatric Specialty Exam:  Presentation  General Appearance:  Appropriate for Environment; Casual  Eye  Contact: Good  Speech: Clear and Coherent; Normal Rate  Speech Volume: Normal  Handedness: Right   Mood and Affect  Mood: Depressed  Affect: Congruent   Thought Process  Thought Processes: Coherent; Goal Directed  Descriptions of Associations:Intact  Orientation:Full (Time, Place and Person)  Thought Content:Logical  History of Schizophrenia/Schizoaffective disorder:No data recorded Duration of Psychotic Symptoms:No data recorded Hallucinations:Hallucinations: None  Ideas of Reference:None  Suicidal Thoughts:Suicidal Thoughts: No  Homicidal Thoughts:Homicidal Thoughts: No   Sensorium  Memory: Immediate Good; Recent Good  Judgment: Poor  Insight: Fair   Art Therapist  Concentration:No data recorded Attention Span: Fair  Recall: Good  Fund of Knowledge: Fair  Language: Fair   Psychomotor Activity  Psychomotor Activity: Psychomotor Activity: Normal   Assets  Assets: Manufacturing Systems Engineer; Desire for Improvement; Housing; Resilience; Social Support   Sleep  Sleep: Sleep: Fair    Physical Exam: Physical Exam Vitals and nursing note reviewed.  Constitutional:      General: She is not in acute distress.    Appearance: She is not ill-appearing.  HENT:     Mouth/Throat:     Pharynx: Oropharynx is clear.  Cardiovascular:     Rate and Rhythm: Normal rate.     Pulses: Normal pulses.  Pulmonary:     Effort: No respiratory distress.  Musculoskeletal:        General: Normal range of motion.  Neurological:     Mental Status: She is alert and oriented to person, place, and time.    Review of Systems  Psychiatric/Behavioral:  Positive for depression. Negative for hallucinations, memory loss, substance abuse and suicidal ideas. The patient is nervous/anxious and has insomnia.   All other systems reviewed and are negative.  Blood pressure 111/72, pulse 66, temperature 98.4 F (36.9 C), temperature source Oral, resp. rate  18, height 5' 8 (1.727 m), weight 82.1 kg, SpO2 100%. Body mass index is 27.52 kg/m.   Treatment Plan Summary: Daily contact with patient to assess and evaluate symptoms and progress in treatment and Medication management  11/11: Patient with improving mood, anxiety, and depressive symptoms. She reports no current medication side effects. Trazodone and Prozac appear to be effective and well tolerated. Patient is appropriately engaged in treatment and demonstrating positive coping and insight. She presents with no psychotic or manic symptoms and interacts appropriately in the milieu. No medication changes are indicated today. Nursing documentation indicate patient signed 72-hour Request for Discharge 11/19/2023 at 1440. Anticipated discharge set for Thursday, November 13 pending continued stability and absence of new concerns.   Diagnoses / Active Problems: Principal Problem:   Bipolar 1 disorder, depressed, severe (HCC) Active Problems:   GERD (gastroesophageal reflux disease)   Nicotine  dependence   History of asthma   Prediabetes   History of migraine                  PLAN: Safety and Monitoring:             -- Voluntary admission to inpatient psychiatric unit for safety, stabilization and treatment             -- Daily contact with patient to assess and evaluate symptoms and progress in treatment             -- Patient's case to be discussed in multi-disciplinary team meeting             -- Observation Level: q15 minute checks             --  Vital signs:  q12 hours             -- Precautions: suicide, elopement, and assault   2. Psychiatric Diagnoses and Treatment:               # Bipolar disorder -- Continue Prozac 20 mg daily, depression, anxiety -- Hydroxyzine  50 mg oral, every 6 hours as needed, anxiety -- Trazodone 50 mg, oral, daily at bedtime, sleep             -- Haldol BH Agitation Protocol (See MAR)                   3. Medical Issues Being Addressed:                        -- Albuterol  inhaler every 4 hours as needed, wheezing, shortness of breath             -- Continue Breo Ellipta inhalation 1 puff daily for asthma, COPD             -- Continue Incruse Ellipta 1 patient 1 puff daily for asthma, COPD             -- Continue Avodart capsule 0.5 mg oral daily             -- Continue estradiol  tablet 2 mg oral 2 times daily             -- Continue metformin  tablet 500 mg oral daily with breakfast, prediabetes             -- Continue Protonix  tablet 40 mg oral daily for GERD             -- Continue progesterone  capsule 200 mg oral daily             -- Continue Crestor  tablet 10 mg oral daily             -- Continue Topamax  tablet 100 mg oral daily for migraine   # Nicotine  Dependence  -- Nicotine  21 patch daily               -- Smoking cessation encouraged     4. Labs               -- CBC: Platelets 429, otherwise WNL             -- CMP: Blood glucose 115, otherwise WNL             -- Ethanol: <15             -- Lipid Panel: Unremarkable             -- HgBA1c: 5.9             -- UDS: Unremarkable             -- EKG: NSR   QT/QTc 384/414    -- The risks/benefits/side-effects/alternatives to this medication were discussed in detail with the patient and time was given for questions. The patient consents to medication trial.  -- FDA -- Metabolic profile and EKG monitoring obtained while on an atypical antipsychotic (BMI: Lipid Panel: HbgA1c: QTc:)               -- Encouraged patient to participate in unit milieu and in scheduled group therapies  -- Short Term Goals: Ability to identify changes in lifestyle to reduce recurrence of condition will improve, Ability to verbalize feelings will  improve, Ability to disclose and discuss suicidal ideas, Ability to demonstrate self-control will improve, Ability to identify and develop effective coping behaviors will improve, Ability to maintain clinical measurements within normal limits will improve, Compliance  with prescribed medications will improve, and Ability to identify triggers associated with substance abuse/mental health issues will improve             -- Long Term Goals: Improvement in symptoms so as ready for discharge     5. Discharge Planning:  -- Social work and case management to assist with discharge planning and identification of hospital follow-up needs prior to discharge -- Estimated LOS: 5-7 days -- Discharge Concerns: Need to establish a safety plan; Medication compliance and effectiveness -- Discharge Goals: Return home with outpatient referrals for mental health follow-up including medication management/psychotherapy       Physician Treatment Plan for Primary Diagnosis: Bipolar 1 disorder, depressed, severe (HCC) Long Term Goal(s): Improvement in symptoms so as ready for discharge   Short Term Goals: Ability to identify changes in lifestyle to reduce recurrence of condition will improve, Ability to verbalize feelings will improve, Ability to disclose and discuss suicidal ideas, Ability to demonstrate self-control will improve, Ability to identify and develop effective coping behaviors will improve, Ability to maintain clinical measurements within normal limits will improve, Compliance with prescribed medications will improve, and Ability to identify triggers associated with substance abuse/mental health issues will improve    I certify that inpatient services furnished can reasonably be expected to improve the patient's condition.      Blair Chiquita Hint, NP 11/20/2023, 8:12 PM

## 2023-11-21 NOTE — Group Note (Signed)
 Date:  11/21/2023 Time:  12:54 PM  Group Topic/Focus:  Pharmacy Group   Pt did attend pharmacy group   Clifford Burgess D Clifford Burgess 11/21/2023, 12:54 PM

## 2023-11-21 NOTE — Plan of Care (Signed)
  Problem: Education: Goal: Emotional status will improve Outcome: Progressing Goal: Verbalization of understanding the information provided will improve Outcome: Progressing   Problem: Activity: Goal: Sleeping patterns will improve Outcome: Progressing   Problem: Coping: Goal: Ability to demonstrate self-control will improve Outcome: Progressing

## 2023-11-21 NOTE — Progress Notes (Signed)
(  Sleep Hours) -7.75  (Any PRNs that were needed, meds refused, or side effects to meds)- hydroxyzine  50mg   (Any disturbances and when (visitation, over night)-none  (Concerns raised by the patient)- none  (SI/HI/AVH)-denies

## 2023-11-21 NOTE — Group Note (Signed)
 Date:  11/21/2023 Time:  5:05 PM  Group Topic/Focus: 5 Love languages Healthy Communication:   The focus of this group is to discuss communication, barriers to communication, as well as healthy ways to communicate with others.    Participation Level:  Active  Participation Quality:  Appropriate  Affect:  Appropriate  Cognitive:  Appropriate  Insight: Appropriate  Engagement in Group:  Engaged  Modes of Intervention:  Discussion and Education  Additional Comments:    Clifford Burgess Clifford Burgess 11/21/2023, 5:05 PM

## 2023-11-21 NOTE — [facility] Suicide Risk Assessment (Signed)
 [facility] INPATIENT:  Family/Significant Other Suicide Prevention Education  Suicide Prevention Education:  Contact Attempts: Clifford Burgess (sister) [phone], (name of family member/significant other) has been identified by the patient as the family member/significant other with whom the patient will be residing, and identified as the person(s) who will aid the patient in the event of a mental health crisis.  With written consent from the patient, two attempts were made to provide suicide prevention education, prior to and/or following the patient's discharge.  We were unsuccessful in providing suicide prevention education.  A suicide education pamphlet was given to the patient to share with family/significant other.  Date and time of second attempt: 11/21/23 @ 100PM Date and time of third attempt: 11/21/23 @ 236PM  Unable to leave voicemail, phone rings and then states call cannot be completed. Will attempt again tomorrow prior to discharge.   [name] 11/21/2023, 2:39 PM

## 2023-11-21 NOTE — Progress Notes (Signed)
   11/21/23 0800  Psych Admission Type (Psych Patients Only)  Admission Status Voluntary/72 hour document signed  Psychosocial Assessment  Patient Complaints None  Eye Contact Fair  Facial Expression Animated  Affect Appropriate to circumstance  Speech Logical/coherent  Interaction Assertive  Motor Activity Slow  Appearance/Hygiene Unremarkable  Behavior Characteristics Cooperative;Appropriate to situation  Mood Pleasant  Thought Process  Coherency WDL  Content WDL  Delusions None reported or observed  Perception WDL  Hallucination None reported or observed  Judgment WDL  Confusion None  Danger to Self  Current suicidal ideation? Denies  Agreement Not to Harm Self Yes  Description of Agreement verbal  Danger to Others  Danger to Others None reported or observed

## 2023-11-21 NOTE — Group Note (Signed)
 Recreation Therapy Group Note   Group Topic:Leisure Education  Group Date: 11/21/2023 Start Time: 0933 End Time: 1005 Facilitators: Trenace Coughlin-McCall, LRT,CTRS Location: [address] Dayroom   Group Topic: Leisure Education    Goal Area(s) Addresses:  Patient will successfully identify benefits of leisure participation. Patient will successfully identify ways to access leisure activities. Patient will identify what leisure is.  Behavioral Response: Moderate   Intervention: Poster Making   Activity: Patients and LRT discussed what leisure was and it's purpose. Patients had to option of working alone or with a partner to create a poster about leisure in general or a specific activity. In that poster, patients were to identify the benefits, types of leisure and importance of it. Patients shared their posters with peers during processing. Patients were debriefed on being active in leisure, and also how leisure can still provide you with lessons to help you learn.   Education:  Leisure Education, Building Control Surveyor   Education Outcome: Acknowledges education   Affect/Mood: Appropriate   Participation Level: Moderate   Participation Quality: Independent   Behavior: Cooperative   Speech/Thought Process: Relevant   Insight: Fair   Judgement: Fair    Modes of Intervention: Art   Patient Response to Interventions:  Receptive   Education Outcome:  In group clarification offered    Clinical Observations/Individualized Feedback: Pt was attentive but appeared to struggle to create a narrative of what leisure was and activities that are leisure. Pt was quiet throughout most of group.      Plan: Continue to engage patient in RT group sessions 2-3x/week.   Ceylin Dreibelbis-McCall, LRT,CTRS 11/21/2023 1:07 PM

## 2023-11-21 NOTE — [facility] Group Notes (Signed)
 Adult Psychoeducational Group Note  Date:  11/21/2023 Time:  8:40 PM  Group Topic/Focus: Coping With Mental Health Crisis:   The purpose of this group is to help patients identify strategies for coping with mental health crisis.  Group discusses possible causes of crisis and ways to manage them effectively.   Participation Level:  Active  Participation Quality:  Appropriate  Affect:  Appropriate  Cognitive:  Appropriate  Insight: Appropriate  Engagement in Group:  Engaged  Modes of Intervention:  Discussion  Additional Comments:  Abel, Hageman 11/21/2023, 8:40 PM

## 2023-11-21 NOTE — Group Note (Signed)
 Date:  11/21/2023 Time:  9:10 AM  Group Topic/Focus:  Goals Group:   The focus of this group is to help patients establish daily goals to achieve during treatment and discuss how the patient can incorporate goal setting into their daily lives to aide in recovery. Orientation:   The focus of this group is to educate the patient on the purpose and policies of crisis stabilization and provide a format to answer questions about their admission.  The group details unit policies and expectations of patients while admitted.    Participation Level:  Active  Participation Quality:  Appropriate  Affect:  Appropriate  Cognitive:  Appropriate  Insight: Appropriate  Engagement in Group:  Developing/Improving  Modes of Intervention:  Discussion and Orientation  Additional Comments:    Lakaisha Danish D Bless Belshe 11/21/2023, 9:10 AM

## 2023-11-21 NOTE — Group Note (Signed)
 Date:  11/21/2023 Time:  11:07 AM  Group Topic/Focus:   Recreational Therapy: The focus of this group was to discuss leisure with a hands on activity meant to help patients explore their physical, emotional, cognitive and/or social wellbeing.  Pt did attend recreational therapy group   Charde Macfarlane D Jaysiah Marchetta 11/21/2023, 11:07 AM

## 2023-11-21 NOTE — [facility] Suicide Risk Assessment (Signed)
 [facility] INPATIENT:  Family/Significant Other Suicide Prevention Education  Suicide Prevention Education:  Education Completed; Verneita Southerly (sister) [phone] ,  (name of family member/significant other) has been identified by the patient as the family member/significant other with whom the patient will be residing, and identified as the person(s) who will aid the patient in the event of a mental health crisis (suicidal ideations/suicide attempt).  With written consent from the patient, the family member/significant other has been provided the following suicide prevention education, prior to the and/or following the discharge of the patient.  Verneita will pick pt up tomorrow morning for discharge between 10-11AM. Does not have access to weapons or firearms in the home. No safety concerns with pt discharging tomorrow. Will return home with sister and her husband.   The suicide prevention education provided includes the following: Suicide risk factors Suicide prevention and interventions National Suicide Hotline telephone number [facility] assessment telephone number [facility] Emergency Assistance 911 [facility] and/or Residential Mobile Crisis Unit telephone number  Request made of family/significant other to: Remove weapons (e.g., guns, rifles, knives), all items previously/currently identified as safety concern.   Remove drugs/medications (over-the-counter, prescriptions, illicit drugs), all items previously/currently identified as a safety concern.  The family member/significant other verbalizes understanding of the suicide prevention education information provided.  The family member/significant other agrees to remove the items of safety concern listed above.  [name] 11/21/2023, 3:23 PM

## 2023-11-21 NOTE — Progress Notes (Signed)
 [facility] MD Progress Note  11/21/2023 1:34 PM Clifford Burgess  MRN:  [id number]  Principal Problem: Bipolar 1 disorder, depressed, severe (HCC) Diagnosis: Principal Problem:   Bipolar 1 disorder, depressed, severe (HCC) Active Problems:   GERD (gastroesophageal reflux disease)   Nicotine  dependence   History of asthma   Prediabetes   History of migraine   Clifford Burgess (she/her/hers) who prefers to be called Clifford Burgess is a 53 year old transgender male with a past psychiatric history significant for bipolar disorder (depressive) and prior polysubstance abuse. The patient was admitted following presentation to [facility] ED, accompanied by his sister, due to worsening depressive symptoms and active suicidal ideation with a plan to overdose. She reports one prior suicide attempt involving an overdose on Wellbutrin  and one previous psychiatric hospitalization in January 2020 related to that attempt.     24-hour Chart Review: Chart reviewed. Patient's case discussed in interdisciplinary team meeting.  Vital signs reviewed without critical value. As needed hydroxyzine  required overnight.  She slept a documented 7.5 hours. She is adherent to taking psychotropic medication regimen.  Patient signed 72-hour Request for Discharge 11/19/2023 at 1440.    Subjective: The patient reports feeling euthymic, with mood improved and stable since admission. Anxiety symptoms are described as manageable. The patient denies feeling down, sad, or depressed. Sleep and appetite are stable, concentration is adequate, and energy level is sufficient. The patient denies suicidal thoughts, intent, or plan, as well as homicidal ideation or psychotic symptoms. No medication side effects are reported. The patient states they have been attending group sessions and have participated in three so far today. Psychosocial stressors, including financial strain, were discussed. The patient verbalized understanding of the  importance of maintaining sobriety. Discussed discharge planning and emphasized medication adherence and attendance at follow-up appointments to prevent symptom exacerbation. Discussed psychosocial stressors, including financial strain. The patient reports that her sister will provide transportation at discharge and expresses motivation to continue with outpatient therapy services.  Total Time spent with patient: 45 minutes  Past Medical History:  Past Medical History:  Diagnosis Date   Asthma    COPD (chronic obstructive pulmonary disease) (HCC)    GERD (gastroesophageal reflux disease)     Past Surgical History:  Procedure Laterality Date   HERNIA REPAIR     Family History:  Family History  Problem Relation Age of Onset   Hypertension Father    Family Psychiatric  History: See H&P  Social History:  Social History   Substance and Sexual Activity  Alcohol Use Not Currently   Alcohol/week: 2.0 standard drinks of alcohol   Types: 2 Cans of beer per week   Comment: occasional     Social History   Substance and Sexual Activity  Drug Use Yes   Types: Cocaine, Marijuana, Heroin, LSD, Crack cocaine   Comment: 11/10/21  Pt reports drug use last week    Social History   Socioeconomic History   Marital status: Divorced    Spouse name: Not on file   Number of children: 2   Years of education: Not on file   Highest education level: 8th grade  Occupational History   Not on file  Tobacco Use   Smoking status: Some Days    Current packs/day: 1.00    Average packs/day: 1 pack/day for 36.0 years (36.0 ttl pk-yrs)    Types: Cigarettes   Smokeless tobacco: Former    Types: Chew   Tobacco comments:    1/2 - 1 pack a day.  Started smoking age 20./3 a day  Vaping Use   Vaping status: Never Used  Substance and Sexual Activity   Alcohol use: Not Currently    Alcohol/week: 2.0 standard drinks of alcohol    Types: 2 Cans of beer per week    Comment: occasional   Drug use: Yes     Types: Cocaine, Marijuana, Heroin, LSD, Crack cocaine    Comment: 11/10/21  Pt reports drug use last week   Sexual activity: Not Currently  Other Topics Concern   Not on file  Social History Narrative   Not on file   Social Drivers of Health   Financial Resource Strain: High Risk (09/28/2023)   Received from [facility]   Overall Financial Resource Strain (CARDIA)    Difficulty of Paying Living Expenses: Hard  Food Insecurity: Food Insecurity Present (11/18/2023)   Hunger Vital Sign    Worried About Running Out of Food in the Last Year: Often true    Ran Out of Food in the Last Year: Often true  Transportation Needs: No Transportation Needs (11/18/2023)   PRAPARE - Administrator, Civil Service (Medical): No    Lack of Transportation (Non-Medical): No  Recent Concern: Transportation Needs - Unmet Transportation Needs (09/28/2023)   Received from [facility] - Transportation    In the past 12 months, has lack of transportation kept you from medical appointments or from getting medications?: No    Lack of Transportation (Non-Medical): Yes  Physical Activity: Insufficiently Active (08/02/2023)   Received from [facility]   Exercise Vital Sign    On average, how many days per week do you engage in moderate to strenuous exercise (like a brisk walk)?: 7 days    On average, how many minutes do you engage in exercise at this level?: 10 min  Stress: Stress Concern Present (08/02/2023)   Received from [facility] - Occupational Stress Questionnaire    Feeling of Stress : Very much  Social Connections: Unknown (08/02/2023)   Received from [facility]   Social Connection and Isolation Panel    In a typical week, how many times do you talk on the phone with family, friends, or neighbors?: More than three times a week    How often do you  get together with friends or relatives?: More than three times a week    Attends Religious Services: Not on file    Do you belong to any clubs or organizations such as church groups, unions, fraternal or athletic groups, or school groups?: No    How often do you attend meetings of the clubs or organizations you belong to?: Never    Are you married, widowed, divorced, separated, never married, or living with a partner?: Never married   Additional Social History:                        Current Medications: Current Facility-Administered Medications  Medication Dose Route Frequency Provider Last Rate Last Admin   acetaminophen  (TYLENOL ) tablet 650 mg  650 mg Oral Q6H PRN Jadapalle, Sree, MD   650 mg at 11/18/23 2152   albuterol  (PROVENTIL ) (2.5 MG/3ML) 0.083% nebulizer solution 2.5 mg  2.5 mg Inhalation Q4H PRN Jadapalle, Sree, MD       alum & mag hydroxide-simeth (MAALOX/MYLANTA) 200-200-20 MG/5ML suspension 30 mL  30 mL Oral Q4H PRN  Jadapalle, Sree, MD       haloperidol (HALDOL) tablet 5 mg  5 mg Oral TID PRN Jadapalle, Sree, MD       And   diphenhydrAMINE  (BENADRYL ) capsule 50 mg  50 mg Oral TID PRN Jadapalle, Sree, MD       haloperidol lactate (HALDOL) injection 5 mg  5 mg Intramuscular TID PRN Jadapalle, Sree, MD       And   diphenhydrAMINE  (BENADRYL ) injection 50 mg  50 mg Intramuscular TID PRN Jadapalle, Sree, MD       And   LORazepam  (ATIVAN ) injection 2 mg  2 mg Intramuscular TID PRN Jadapalle, Sree, MD       haloperidol lactate (HALDOL) injection 10 mg  10 mg Intramuscular TID PRN Jadapalle, Sree, MD       And   diphenhydrAMINE  (BENADRYL ) injection 50 mg  50 mg Intramuscular TID PRN Jadapalle, Sree, MD       And   LORazepam  (ATIVAN ) injection 2 mg  2 mg Intramuscular TID PRN Jadapalle, Sree, MD       dutasteride (AVODART) capsule 0.5 mg  0.5 mg Oral Daily Jadapalle, Sree, MD   0.5 mg at 11/21/23 0818   estradiol  (ESTRACE ) tablet 2 mg  2 mg Oral BID Jadapalle, Sree, MD   2  mg at 11/21/23 0817   FLUoxetine (PROZAC) capsule 20 mg  20 mg Oral Daily Laniah Grimm H, NP   20 mg at 11/21/23 0817   fluticasone furoate-vilanterol (BREO ELLIPTA) 100-25 MCG/ACT 1 puff  1 puff Inhalation Daily Jadapalle, Sree, MD       hydrOXYzine  (ATARAX ) tablet 50 mg  50 mg Oral Q6H PRN Jadapalle, Sree, MD   50 mg at 11/20/23 2100   magnesium  hydroxide (MILK OF MAGNESIA) suspension 30 mL  30 mL Oral Daily PRN Donnelly Mellow, MD       metFORMIN  (GLUCOPHAGE ) tablet 500 mg  500 mg Oral Q breakfast Jadapalle, Sree, MD   500 mg at 11/21/23 0818   nicotine  (NICODERM CQ  - dosed in mg/24 hours) patch 21 mg  21 mg Transdermal Daily Jadapalle, Sree, MD   21 mg at 11/21/23 0817   pantoprazole  (PROTONIX ) EC tablet 40 mg  40 mg Oral Daily Jadapalle, Sree, MD   40 mg at 11/21/23 0818   progesterone  (PROMETRIUM ) capsule 200 mg  200 mg Oral Daily Jadapalle, Sree, MD   200 mg at 11/21/23 9182   rosuvastatin  (CRESTOR ) tablet 10 mg  10 mg Oral Daily Jadapalle, Sree, MD   10 mg at 11/21/23 0818   topiramate  (TOPAMAX ) tablet 100 mg  100 mg Oral Daily Jadapalle, Sree, MD   100 mg at 11/21/23 0818   traZODone (DESYREL) tablet 50 mg  50 mg Oral QHS Ahaana Rochette H, NP   50 mg at 11/20/23 2059   umeclidinium bromide (INCRUSE ELLIPTA) 62.5 MCG/ACT 1 puff  1 puff Inhalation Daily Jadapalle, Sree, MD        Lab Results:  No results found for this or any previous visit (from the past 48 hours).   Blood Alcohol level:  Lab Results  Component Value Date   [facility] <15 11/17/2023   ETH <10 02/17/2021    Metabolic Disorder Labs: Lab Results  Component Value Date   HGBA1C 5.9 (H) 11/19/2023   MPG 122.63 11/19/2023   MPG 137 05/21/2023   No results found for: PROLACTIN Lab Results  Component Value Date   CHOL 122 11/19/2023   TRIG 91 11/19/2023  HDL 47 11/19/2023   CHOLHDL 2.6 11/19/2023   VLDL 18 11/19/2023   LDLCALC 57 11/19/2023   LDLCALC 127 (H) 08/07/2023    Physical Findings: AIMS:  ,   ,  ,  ,  ,  ,   CIWA:    COWS:     Musculoskeletal: Strength & Muscle Tone: within normal limits Gait & Station: normal Patient leans: N/A  Psychiatric Specialty Exam:  Presentation  General Appearance:  Casual; Fairly Groomed  Eye Contact: Good  Speech: Clear and Coherent; Normal Rate  Speech Volume: Normal  Handedness: Right   Mood and Affect  Mood: Euthymic  Affect: Congruent   Thought Process  Thought Processes: Coherent; Linear  Descriptions of Associations:Intact  Orientation:Full (Time, Place and Person)  Thought Content:Logical  History of Schizophrenia/Schizoaffective disorder:No data recorded Duration of Psychotic Symptoms:No data recorded Hallucinations:Hallucinations: None   Ideas of Reference:None  Suicidal Thoughts:Suicidal Thoughts: No   Homicidal Thoughts:Homicidal Thoughts: No    Sensorium  Memory: Immediate Good; Recent Good; Remote Good  Judgment: Fair  Insight: Fair   Risk Manager  Attention Span: Good  Recall: Good  Fund of Knowledge: Good  Language: Good   Psychomotor Activity  Psychomotor Activity: Psychomotor Activity: Normal    Assets  Assets: Communication Skills; Desire for Improvement; Housing; Resilience; Social Support; Transportation   Sleep  Sleep: Sleep: Good    Physical Exam: Physical Exam Vitals and nursing note reviewed.  Constitutional:      General: She is not in acute distress.    Appearance: She is not ill-appearing.  HENT:     Mouth/Throat:     Pharynx: Oropharynx is clear.  Cardiovascular:     Rate and Rhythm: Normal rate.     Pulses: Normal pulses.  Pulmonary:     Effort: No respiratory distress.  Musculoskeletal:        General: Normal range of motion.  Neurological:     Mental Status: She is alert and oriented to person, place, and time.    Review of Systems  Psychiatric/Behavioral:  Positive for depression. Negative for  hallucinations, memory loss, substance abuse and suicidal ideas. The patient is nervous/anxious and has insomnia.   All other systems reviewed and are negative.  Blood pressure 118/65, pulse 65, temperature 97.7 F (36.5 C), temperature source Oral, resp. rate 20, height 5' 8 (1.727 m), weight 82.1 kg, SpO2 96%. Body mass index is 27.52 kg/m.   Treatment Plan Summary: Daily contact with patient to assess and evaluate symptoms and progress in treatment and Medication management  11/12: No new labs ordered. Patient presents with a stable and improved mood, demonstrating euthymia and manageable anxiety symptoms. There are no indications of suicidal or homicidal ideation, psychosis, or medication side effects. The patient appears engaged in treatment, actively participating in group sessions, and demonstrating insight into the importance of medication adherence and follow-up care to prevent symptom exacerbation. Psychoeducation regarding discharge planning was reviewed, and the patient remains receptive and motivated. Discharge is anticipated for Thursday, November 13, pending continued stability. The plan is to maintain the current treatment regimen with no changes today.  Diagnoses / Active Problems: Principal Problem:   Bipolar 1 disorder, depressed, severe (HCC) Active Problems:   GERD (gastroesophageal reflux disease)   Nicotine  dependence   History of asthma   Prediabetes   History of migraine                  PLAN: Safety and Monitoring:             --  Voluntary admission to inpatient psychiatric unit for safety, stabilization and treatment             -- Daily contact with patient to assess and evaluate symptoms and progress in treatment             -- Patient's case to be discussed in multi-disciplinary team meeting             -- Observation Level: q15 minute checks             -- Vital signs:  q12 hours             -- Precautions: suicide, elopement, and assault   2.  Psychiatric Diagnoses and Treatment:               # Bipolar disorder -- Continue Prozac 20 mg daily, depression, anxiety -- Hydroxyzine  50 mg oral, every 6 hours as needed, anxiety -- Trazodone 50 mg, oral, daily at bedtime, sleep             -- Haldol BH Agitation Protocol (See MAR)                 3. Medical Issues Being Addressed:                       -- Albuterol  inhaler every 4 hours as needed, wheezing, shortness of breath             -- Continue Breo Ellipta inhalation 1 puff daily for asthma, COPD             -- Continue Incruse Ellipta 1 patient 1 puff daily for asthma, COPD             -- Continue Avodart capsule 0.5 mg oral daily             -- Continue estradiol  tablet 2 mg oral 2 times daily             -- Continue metformin  tablet 500 mg oral daily with breakfast, prediabetes             -- Continue Protonix  tablet 40 mg oral daily for GERD             -- Continue progesterone  capsule 200 mg oral daily             -- Continue Crestor  tablet 10 mg oral daily             -- Continue Topamax  tablet 100 mg oral daily for migraine   # Nicotine  Dependence  -- Nicotine  21 patch daily               -- Smoking cessation encouraged     4. Labs               -- CBC: Platelets 429, otherwise WNL             -- CMP: Blood glucose 115, otherwise WNL             -- Ethanol: <15             -- Lipid Panel: Unremarkable             -- HgBA1c: 5.9             -- UDS: Unremarkable             -- EKG: NSR   QT/QTc 384/414    -- The risks/benefits/side-effects/alternatives to this medication were  discussed in detail with the patient and time was given for questions. The patient consents to medication trial.  -- FDA -- Metabolic profile and EKG monitoring obtained while on an atypical antipsychotic (BMI: Lipid Panel: HbgA1c: QTc:)               -- Encouraged patient to participate in unit milieu and in scheduled group therapies  -- Short Term Goals: Ability to identify changes in  lifestyle to reduce recurrence of condition will improve, Ability to verbalize feelings will improve, Ability to disclose and discuss suicidal ideas, Ability to demonstrate self-control will improve, Ability to identify and develop effective coping behaviors will improve, Ability to maintain clinical measurements within normal limits will improve, Compliance with prescribed medications will improve, and Ability to identify triggers associated with substance abuse/mental health issues will improve             -- Long Term Goals: Improvement in symptoms so as ready for discharge     5. Discharge Planning:  -- Social work and case management to assist with discharge planning and identification of hospital follow-up needs prior to discharge -- Estimated LOS: 5-7 days -- Discharge Concerns: Need to establish a safety plan; Medication compliance and effectiveness -- Discharge Goals: Return home with outpatient referrals for mental health follow-up including medication management/psychotherapy       Physician Treatment Plan for Primary Diagnosis: Bipolar 1 disorder, depressed, severe (HCC) Long Term Goal(s): Improvement in symptoms so as ready for discharge   Short Term Goals: Ability to identify changes in lifestyle to reduce recurrence of condition will improve, Ability to verbalize feelings will improve, Ability to disclose and discuss suicidal ideas, Ability to demonstrate self-control will improve, Ability to identify and develop effective coping behaviors will improve, Ability to maintain clinical measurements within normal limits will improve, Compliance with prescribed medications will improve, and Ability to identify triggers associated with substance abuse/mental health issues will improve    I certify that inpatient services furnished can reasonably be expected to improve the patient's condition.      Blair Chiquita Hint, NP 11/21/2023, 1:34 PM Patient ID: Dallas Dunnings Ludemann, adult    DOB: 1970/06/01, 53 y.o.   MRN: [id number]

## 2023-11-21 NOTE — Plan of Care (Signed)
   Problem: Education: Goal: Knowledge of [facility] Education information/materials will improve Outcome: Progressing Goal: Emotional status will improve Outcome: Progressing Goal: Mental status will improve Outcome: Progressing Goal: Verbalization of understanding the information provided will improve Outcome: Progressing

## 2023-11-21 NOTE — Group Note (Signed)
 Date:  11/21/2023 Time:  4:14 PM  Group Topic/Focus: Spiritual Wellness (By [employer])   Pt did attend spiritual wellness group   Ebony Yorio D Jacklyn Branan 11/21/2023, 4:14 PM

## 2023-11-21 NOTE — Progress Notes (Signed)
 Spiritual care group facilitated by Chaplain Rockie Sofia, B[facility] and Alan Lesches, Mdiv  Group focused on topic of strength. Group members reflected on what thoughts and feelings emerge when they hear this topic. They then engaged in facilitated dialog around how strength is present in their lives. This dialog focused on representing what strength had been to them in their lives (images and patterns given) and what they saw as helpful in their life now (what they needed / wanted).  Activity drew on narrative framework.  Patient Progress: Clifford Burgess attended group.  Though verbal participation was minimal, she demonstrated engagement in the group conversation and activities.

## 2023-11-22 DIAGNOSIS — F17200 Nicotine dependence, unspecified, uncomplicated: Secondary | ICD-10-CM

## 2023-11-22 DIAGNOSIS — R7303 Prediabetes: Secondary | ICD-10-CM

## 2023-11-22 DIAGNOSIS — F314 Bipolar disorder, current episode depressed, severe, without psychotic features: Principal | ICD-10-CM

## 2023-11-22 DIAGNOSIS — Z8669 Personal history of other diseases of the nervous system and sense organs: Secondary | ICD-10-CM

## 2023-11-22 DIAGNOSIS — Z8709 Personal history of other diseases of the respiratory system: Secondary | ICD-10-CM

## 2023-11-22 DIAGNOSIS — K219 Gastro-esophageal reflux disease without esophagitis: Secondary | ICD-10-CM

## 2023-11-22 MED ORDER — NICOTINE 21 MG/24HR TD PT24: 21.0000 mg | MEDICATED_PATCH | Freq: Every day | TRANSDERMAL | 0 refills | Status: AC

## 2023-11-22 MED ORDER — FLUOXETINE HCL 20 MG PO CAPS: 20.0000 mg | ORAL_CAPSULE | Freq: Every day | ORAL | 0 refills | Status: AC

## 2023-11-22 MED ORDER — TOPIRAMATE 100 MG PO TABS: 100.0000 mg | ORAL_TABLET | Freq: Every day | ORAL | 0 refills | Status: AC

## 2023-11-22 NOTE — Group Note (Signed)
 Date:  11/22/2023 Time:  10:14 AM  Group Topic/Focus:  Goals Group:   The focus of this group is to help patients establish daily goals to achieve during treatment and discuss how the patient can incorporate goal setting into their daily lives to aide in recovery.    Participation Level:  Did Not Attend  Participation Quality:  NA  Affect:  NA  Cognitive:  NA  Insight: NA  Engagement in Group:  NA  Modes of Intervention:  NA  Additional Comments:  Patient did not attend group.  Rosaleen D Keandrea Tapley 11/22/2023, 10:14 AM

## 2023-11-22 NOTE — [facility] Suicide Risk Assessment (Signed)
 [facility] Discharge Suicide Risk Assessment   Principal Problem: Bipolar 1 disorder, depressed, severe (HCC) Discharge Diagnoses: Principal Problem:   Bipolar 1 disorder, depressed, severe (HCC) Active Problems:   GERD (gastroesophageal reflux disease)   Nicotine  dependence   History of asthma   Prediabetes   History of migraine Demographic Factors:  Male, Caucasian, Gay, lesbian, or bisexual orientation, and Unemployed  Loss Factors: NA  Historical Factors: Prior suicide attempts and Impulsivity  Risk Reduction Factors:   Sense of responsibility to family and Living with another person, especially a relative  Continued Clinical Symptoms:  Bipolar mood disorder  Cognitive Features That Contribute To Risk:  None    Suicide Risk:  Mild:  Suicidal ideation of limited frequency, intensity, duration, and specificity.  There are no identifiable plans, no associated intent, mild dysphoria and related symptoms, good self-control (both objective and subjective assessment), few other risk factors, and identifiable protective factors, including available and accessible social support.   Follow-up Information     [facility] Follow up on 11/29/2023.   Why: You have a hospital follow up appointment for therapy and medication management services on 11/29/23 at 8:00 am.  The appointment will be Virtual, telehealth. Contact information: [address]  [address] [place] [address] [phone]                  Oliva DELENA Salmon, DO 11/22/2023, 9:07 AM

## 2023-11-22 NOTE — Progress Notes (Signed)
 Patient verbalizes readiness for discharge. All patient belongings returned to patient. Discharge instructions read and discussed with patient (appointments, medications, resources). Patient expressed gratitude for care provided. Patient discharged to lobby at 1030 where her ride was waiting.

## 2023-11-22 NOTE — Progress Notes (Signed)
  [facility] Adult Case Management Discharge Plan :  Will you be returning to the same living situation after discharge:  Yes,  pt returning home with sister At discharge, do you have transportation home?: Yes,  pt will be picked up by sister between 10-11AM Do you have the ability to pay for your medications: Yes,  pt has active health insurance coverage (MCD)  Release of information consent forms completed and in the chart;  Patient's signature needed at discharge.  Patient to Follow up at:  Follow-up Information     [facility] Follow up on 11/29/2023.   Why: You have a hospital follow up appointment for therapy and medication management services on 11/29/23 at 8:00 am.  The appointment will be Virtual, telehealth. Contact information: [address]  [address] [place] [address] [phone]                 Next level of care provider has access to [facility]:no  Safety Planning and Suicide Prevention discussed: Yes,   Verneita Southerly (sister) [phone]     Has patient been referred to the Quitline?: Patient refused referral for treatment  Patient has been referred for addiction treatment: No known substance use disorder. Pt not currently using substances/alcohol  [name], LCSWA 11/22/2023, 8:59 AM

## 2023-11-26 NOTE — Progress Notes (Signed)

## 2023-11-26 NOTE — Discharge Summary (Signed)
 Physician Discharge Summary Note  Patient:  Clifford Burgess is an 53 y.o., adult MRN:  [id number] DOB:  1970/08/15 Patient phone:  [phone] (home)  Patient address:   [address] [place] [address],  Total Time spent with patient: 45 minutes  Date of Admission:  11/18/2023 Date of Discharge: 11/22/2023  Reason for Admission:  The patient is a 53 year old transgender male with a history of bipolar disorder, PTSD, and prior polysubstance abuse, who was admitted voluntarily following presentation to the [facility] ED for worsening depressive symptoms and active suicidal ideation with a plan to overdose.   Principal Problem: Bipolar 1 disorder, depressed, severe (HCC) Discharge Diagnoses: Principal Problem:   Bipolar 1 disorder, depressed, severe (HCC) Active Problems:   GERD (gastroesophageal reflux disease)   Nicotine  dependence   History of asthma   Prediabetes   History of migraine   Past Psychiatric History:  Previous Psych Diagnoses: Bipolar disorder, Depression, Anxiety, PTSD Prior inpatient treatment: 2020, [facility] after suicide attempt. Current/prior outpatient treatment: None currently; denies past consistent outpatient care. Prior rehab tx: Denies. Psychotherapy tx: Denies; open to starting virtual therapy.   History of suicide: One attempt in 2020 via prescription overdose. History of homicide: Denies. Psychiatric medication history: Wellbutrin  (disliked, worsened smoking); no psych meds >3 years. Psychiatric medication compliance history: Previously noncompliant. Neuromodulation history: Denies. Current Psychiatrist: None. Current Therapist: None.  Past Medical History:  Past Medical History:  Diagnosis Date   Asthma    COPD (chronic obstructive pulmonary disease) (HCC)    GERD (gastroesophageal reflux disease)     Past Surgical History:  Procedure Laterality Date   HERNIA REPAIR      Hospital  Course:  Upon admission the patient presented with severe depressive symptoms, including hopelessness, low energy, poor sleep, and active suicidal ideation with a specific plan to overdose. She was started on fluoxetine 20 mg daily to address her depression and anxiety, and trazodone 50 mg at bedtime to assist with sleep. She tolerated the new medications well, reporting no side effects. Her mood improved steadily, and by the third hospital day, she described her mood as euthymic and her anxiety as manageable. She denied any ongoing suicidal or homicidal ideation, and there were no indications of psychosis or behavioral disturbances.  Shortly after admission she signed a 72-hour request for discharge. She remained stable throughout her hospitalization, with no safety incidents or behavioral concerns. By the time of discharge, she was engaged, future-oriented, and demonstrated improved coping skills, with a clear plan for ongoing outpatient care and support.    Musculoskeletal: Normal gait and station  Mental Status Exam: Appearance - Casually dressed, appropriate hygiene and grooming  Eye-Contact - Normal Attitude - Calm, polite, not guarded Speech - normal volume, prosody, inflection Mood - Okay Affect - Mildly restricted Thought Process - LLGD Thought Content - No delusional TC expressed SI/HI - Denies  Perceptions - Denies AVH; not RIS Judgement/Insight - Fair Fund of knowledge - WNL Language - No impairments   Physical Exam Vitals reviewed.  Constitutional:      Appearance: Normal appearance. She is normal weight.  HENT:     Head: Normocephalic and atraumatic.  Pulmonary:     Effort: Pulmonary effort is normal.  Neurological:     General: No focal deficit present.     Mental Status: She is alert and oriented to person, place, and time.    Review of Systems  Constitutional: Negative.   Respiratory: Negative.  Cardiovascular: Negative.    Blood pressure 117/71, pulse  67, temperature 98.3 F (36.8 C), temperature source Oral, resp. rate 20, height 5' 8 (1.727 m), weight 82.1 kg, SpO2 98%. Body mass index is 27.52 kg/m.   Social History   Tobacco Use  Smoking Status Some Days   Current packs/day: 1.00   Average packs/day: 1 pack/day for 36.0 years (36.0 ttl pk-yrs)   Types: Cigarettes  Smokeless Tobacco Former   Types: Chew  Tobacco Comments   1/2 - 1 pack a day.  Started smoking age 47./3 a day   Tobacco Cessation:  A prescription for an FDA-approved tobacco cessation medication was offered at discharge and the patient refused   Blood Alcohol level:  Lab Results  Component Value Date   [facility] <15 11/17/2023   ETH <10 02/17/2021    Metabolic Disorder Labs:  Lab Results  Component Value Date   HGBA1C 5.9 (H) 11/19/2023   MPG 122.63 11/19/2023   MPG 137 05/21/2023   No results found for: PROLACTIN Lab Results  Component Value Date   CHOL 122 11/19/2023   TRIG 91 11/19/2023   HDL 47 11/19/2023   CHOLHDL 2.6 11/19/2023   VLDL 18 11/19/2023   LDLCALC 57 11/19/2023   LDLCALC 127 (H) 08/07/2023    See Psychiatric Specialty Exam and Suicide Risk Assessment completed by Attending Physician prior to discharge.  Discharge destination:  Home  Is patient on multiple antipsychotic therapies at discharge:  No      Allergies as of 11/22/2023       Reactions   Chocolate Nausea And Vomiting, Other (See Comments)        Medication List     STOP taking these medications    aspirin EC 81 MG tablet   ipratropium-albuterol  0.5-2.5 (3) MG/3ML Soln Commonly known as: DUONEB   metoprolol tartrate 50 MG tablet Commonly known as: LOPRESSOR   OXYGEN    Spiriva  Respimat 2.5 MCG/ACT Aers Generic drug: Tiotropium Bromide       TAKE these medications      Indication  Accu-Chek Guide Me w/Device Kit Use as directed. E11.65  Indication: DM2   Accu-Chek Softclix Lancets lancets Use as instructed  Indication: DM2   albuterol   108 (90 Base) MCG/ACT inhaler Commonly known as: VENTOLIN  HFA TAKE 2 PUFFS BY MOUTH EVERY 6 HOURS AS NEEDED FOR WHEEZE OR SHORTNESS OF BREATH  Indication: Asthma   Dexcom G7 Sensor Misc USE AS DIRECTED EVERY 10 DAYS  Indication: DM2   dutasteride 0.5 MG capsule Commonly known as: AVODART Take 0.5 mg by mouth daily.  Indication: Male Pattern Baldness   estradiol  1 MG tablet Commonly known as: ESTRACE  Take 2 mg by mouth 2 (two) times daily.  Indication: Transgender Woman   FLUoxetine 20 MG capsule Commonly known as: PROZAC Take 1 capsule (20 mg total) by mouth daily.  Indication: Depression   glucose blood test strip Use as instructed  Indication: DM2   meclizine  25 MG tablet Commonly known as: ANTIVERT  TAKE 1 TABLET BY MOUTH 3 TIMES DAILY AS NEEDED FOR DIZZINESS.  Indication: Dizzy   metFORMIN  500 MG tablet Commonly known as: GLUCOPHAGE  Take 1 tablet (500 mg total) by mouth daily with breakfast.  Indication: Type 2 Diabetes   mometasone -formoterol  100-5 MCG/ACT Aero Commonly known as: DULERA Inhale 2 puffs into the lungs 2 (two) times daily.  Indication: Chronic Obstructive Lung Disease   nicotine  21 mg/24hr patch Commonly known as: NICODERM CQ  - dosed in mg/24 hours Place 1  patch (21 mg total) onto the skin daily.  Indication: Nicotine  Addiction   omeprazole  20 MG capsule Commonly known as: PRILOSEC TAKE 1 CAPSULE BY MOUTH EVERY DAY  Indication: Gastroesophageal Reflux Disease   ondansetron  4 MG disintegrating tablet Commonly known as: ZOFRAN -ODT Take 1 tablet (4 mg total) by mouth every 8 (eight) hours as needed for nausea or vomiting.  Indication: Nausea and Vomiting   progesterone  200 MG capsule Commonly known as: PROMETRIUM  SMARTSIG:200 Milligram(s) By Mouth Every Evening  Indication: Gender affirming care   rosuvastatin  10 MG tablet Commonly known as: CRESTOR  TAKE 1 TABLET BY MOUTH EVERY DAY  Indication: High Amount of Fats in the Blood    topiramate  100 MG tablet Commonly known as: TOPAMAX  Take 1 tablet (100 mg total) by mouth daily. What changed:  medication strength how much to take  Indication: Migraine Headache        Follow-up Information     [facility] Follow up on 11/29/2023.   Why: You have a hospital follow up appointment for therapy and medication management services on 11/29/23 at 8:00 am.  The appointment will be Virtual, telehealth. Contact information: [address]  [address] [place] [address] [phone]                 Follow-up recommendations:  Activity:  As tolerated Diet:  Regular    Signed: Oliva DELENA Salmon, DO 11/26/2023, 5:44 PM

## 2024-01-30 ENCOUNTER — Telehealth: Payer: Self-pay | Admitting: Internal Medicine

## 2024-01-30 ENCOUNTER — Encounter: Admitting: Internal Medicine

## 2024-01-30 NOTE — Telephone Encounter (Signed)
 Lvm to r/s today's pft-Toni

## 2024-02-11 ENCOUNTER — Ambulatory Visit: Admitting: Internal Medicine

## 2024-02-12 ENCOUNTER — Telehealth: Payer: Self-pay | Admitting: Internal Medicine
